# Patient Record
Sex: Female | Born: 1990
Health system: Southern US, Community
[De-identification: ages and names within clinical notes are randomized; demographics above are authoritative.]

## PROBLEM LIST (undated history)

## (undated) DIAGNOSIS — N920 Excessive and frequent menstruation with regular cycle: Secondary | ICD-10-CM

## (undated) DIAGNOSIS — B9689 Other specified bacterial agents as the cause of diseases classified elsewhere: Secondary | ICD-10-CM

## (undated) DIAGNOSIS — D259 Leiomyoma of uterus, unspecified: Secondary | ICD-10-CM

## (undated) DIAGNOSIS — B379 Candidiasis, unspecified: Secondary | ICD-10-CM

## (undated) DIAGNOSIS — K219 Gastro-esophageal reflux disease without esophagitis: Secondary | ICD-10-CM

## (undated) DIAGNOSIS — N760 Acute vaginitis: Secondary | ICD-10-CM

## (undated) DIAGNOSIS — J302 Other seasonal allergic rhinitis: Secondary | ICD-10-CM

## (undated) DIAGNOSIS — O3410 Maternal care for benign tumor of corpus uteri, unspecified trimester: Secondary | ICD-10-CM

## (undated) DIAGNOSIS — Z3490 Encounter for supervision of normal pregnancy, unspecified, unspecified trimester: Secondary | ICD-10-CM

## (undated) DIAGNOSIS — N390 Urinary tract infection, site not specified: Secondary | ICD-10-CM

## (undated) HISTORY — PX: NO PAST SURGERIES: SHX2092

## (undated) HISTORY — PX: TUBAL LIGATION: SHX77

---
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
## 2011-10-06 ENCOUNTER — Emergency Department (HOSPITAL_COMMUNITY)
Admission: EM | Admit: 2011-10-06 | Discharge: 2011-10-06 | Disposition: A | Discharge status: Home / Self Care | Payer: Self-pay

## 2011-10-06 NOTE — ED Notes (Signed)
Called for triage x1.

## 2011-10-06 NOTE — ED Notes (Signed)
Called for triage x3 no answer  

## 2011-10-06 NOTE — ED Notes (Signed)
Called for triage x2

## 2011-11-16 ENCOUNTER — Emergency Department (HOSPITAL_COMMUNITY): Payer: BC Managed Care – PPO

## 2011-11-16 ENCOUNTER — Emergency Department (HOSPITAL_COMMUNITY)
Admission: EM | Admit: 2011-11-16 | Discharge: 2011-11-16 | Disposition: A | Discharge status: Home / Self Care | Payer: BC Managed Care – PPO | Attending: Emergency Medicine | Admitting: Emergency Medicine

## 2011-11-16 ENCOUNTER — Encounter (HOSPITAL_COMMUNITY): Payer: Self-pay | Admitting: *Deleted

## 2011-11-16 DIAGNOSIS — R071 Chest pain on breathing: Secondary | ICD-10-CM | POA: Insufficient documentation

## 2011-11-16 DIAGNOSIS — R0789 Other chest pain: Secondary | ICD-10-CM

## 2011-11-16 LAB — BASIC METABOLIC PANEL
GFR calc Af Amer: >90 mL/min (ref >90)
GFR calc non Af Amer: >90 mL/min (ref >90)
Glucose, Bld: 122 mg/dL — ABNORMAL HIGH (ref 70–99)
Potassium: 3.9 mEq/L (ref 3.5–5.1)
Sodium: 137 mEq/L (ref 135–145)

## 2011-11-16 LAB — CBC
MCH: 30.9 pg (ref 26.0–34.0)
Platelets: 308 10*3/uL (ref 150–400)
RBC: 4.01 MIL/uL (ref 3.87–5.11)

## 2011-11-16 LAB — DIFFERENTIAL
Basophils Absolute: 0 10*3/uL (ref 0.0–0.1)
Basophils Relative: 0 % (ref 0–1)
Eosinophils Absolute: 0.2 10*3/uL (ref 0.0–0.7)
Lymphs Abs: 3.7 10*3/uL (ref 0.7–4.0)
Neutrophils Relative %: 43 % (ref 43–77)

## 2011-11-16 LAB — D-DIMER, QUANTITATIVE: D-Dimer, Quant: 0.24 ug/mL-FEU (ref 0.00–0.48)

## 2011-11-16 MED ORDER — GI COCKTAIL ~~LOC~~
30.0000 mL | Freq: Once | ORAL | Status: AC
  Administered 2011-11-16: 30 mL via ORAL
  Filled 2011-11-16: qty 30

## 2011-11-16 NOTE — ED Notes (Signed)
Lt upper chest pain,No cough, or cold.  Sl sob,  Pain awakened pt..  No injury

## 2011-11-16 NOTE — Discharge Instructions (Signed)
Chest Pain (Nonspecific) There is no evidence of a heart attack or blood clot in the lung.  Follow up with your doctor.  Return to the ED if you develop new or worsening symptoms. It is often hard to give a specific diagnosis for the cause of chest pain. There is always a chance that your pain could be related to something serious, such as a heart attack or a blood clot in the lungs. You need to follow up with your caregiver for further evaluation. CAUSES   Heartburn.   Pneumonia or bronchitis.   Anxiety or stress.   Inflammation around your heart (pericarditis) or lung (pleuritis or pleurisy).   A blood clot in the lung.   A collapsed lung (pneumothorax). It can develop suddenly on its own (spontaneous pneumothorax) or from injury (trauma) to the chest.   Shingles infection (herpes zoster virus).  The chest wall is composed of bones, muscles, and cartilage. Any of these can be the source of the pain.  The bones can be bruised by injury.   The muscles or cartilage can be strained by coughing or overwork.   The cartilage can be affected by inflammation and become sore (costochondritis).  DIAGNOSIS  Lab tests or other studies, such as X-rays, electrocardiography, stress testing, or cardiac imaging, may be needed to find the cause of your pain.  TREATMENT   Treatment depends on what may be causing your chest pain. Treatment may include:   Acid blockers for heartburn.   Anti-inflammatory medicine.   Pain medicine for inflammatory conditions.   Antibiotics if an infection is present.   You may be advised to change lifestyle habits. This includes stopping smoking and avoiding alcohol, caffeine, and chocolate.   You may be advised to keep your head raised (elevated) when sleeping. This reduces the chance of acid going backward from your stomach into your esophagus.   Most of the time, nonspecific chest pain will improve within 2 to 3 days with rest and mild pain medicine.  HOME  CARE INSTRUCTIONS   If antibiotics were prescribed, take your antibiotics as directed. Finish them even if you start to feel better.   For the next few days, avoid physical activities that bring on chest pain. Continue physical activities as directed.   Do not smoke.   Avoid drinking alcohol.   Only take over-the-counter or prescription medicine for pain, discomfort, or fever as directed by your caregiver.   Follow your caregiver's suggestions for further testing if your chest pain does not go away.   Keep any follow-up appointments you made. If you do not go to an appointment, you could develop lasting (chronic) problems with pain. If there is any problem keeping an appointment, you must call to reschedule.  SEEK MEDICAL CARE IF:   You think you are having problems from the medicine you are taking. Read your medicine instructions carefully.   Your chest pain does not go away, even after treatment.   You develop a rash with blisters on your chest.  SEEK IMMEDIATE MEDICAL CARE IF:   You have increased chest pain or pain that spreads to your arm, neck, jaw, back, or abdomen.   You develop shortness of breath, an increasing cough, or you are coughing up blood.   You have severe back or abdominal pain, feel nauseous, or vomit.   You develop severe weakness, fainting, or chills.   You have a fever.  THIS IS AN EMERGENCY. Do not wait to see if the pain  will go away. Get medical help at once. Call your local emergency services (911 in [place] Do not drive yourself to the hospital. MAKE SURE YOU:   Understand these instructions.   Will watch your condition.   Will get help right away if you are not doing well or get worse.  Document Released: 02/21/2005 Document Revised: 05/03/2011 Document Reviewed: 12/18/2007 [facility] Patient Information 2012 [place], [place].

## 2011-11-16 NOTE — ED Provider Notes (Signed)
History   This chart was scribed for Glynn Octave, MD by Melba Coon. The patient was seen in room APA18/APA18 and the patient's care was started at 9:08PM.    CSN: [id number]  Arrival date & time 11/16/11  2047   First MD Initiated Contact with Patient 11/16/11 2055      Chief Complaint  Patient presents with  . Chest Pain    (Consider location/radiation/quality/duration/timing/severity/associated sxs/prior treatment) HPI Jacqueline Underwood is a 21 y.o. female who presents to the Emergency Department complaining of constant, moderate to severe, left, upper, central chest pain with an onset this evening (an hour ago). Pt was asleep and the pain woke her up. Pt had just eaten [place] food prior to going asleep. Pt has never experienced this type of pain before. Pt is not on birth control; PERC negative, no recent surgeries. No pain meds taken at home. SOB present. No HA, cough, fever, neck pain, sore throat, rash, back pain, abd pain, n/v/d, dysuria, or extremity pain, edema, weakness, numbness, or tingling. No known allergies. No other pertinent medical symptoms.  History reviewed. No pertinent past medical history.  History reviewed. No pertinent past surgical history.  History reviewed. No pertinent family history.  History  Substance Use Topics  . Smoking status: Never Smoker   . Smokeless tobacco: Not on file  . Alcohol Use: No    OB History    Grav Para Term Preterm Abortions TAB SAB Ect Mult Living                  Review of Systems 10 Systems reviewed and all are negative for acute change except as noted in the HPI.   Allergies  Review of patient's allergies indicates not on file.  Home Medications  No current outpatient prescriptions on file.  BP 127/79  Pulse 69  Temp 98.2 F (36.8 C) (Oral)  Resp 20  Ht 5' (1.524 m)  Wt 200 lb (90.719 kg)  BMI 39.06 kg/m2  SpO2 100%  LMP 11/11/2011  Physical Exam  Nursing note and vitals  reviewed. Constitutional: She is oriented to person, place, and time. She appears well-developed and well-nourished. No distress.  HENT:  Head: Normocephalic and atraumatic.  Eyes: EOM are normal.  Neck: Neck supple. No tracheal deviation present.  Cardiovascular: Normal rate, regular rhythm and normal heart sounds.  Exam reveals no gallop and no friction rub.   No murmur heard. Pulmonary/Chest: Effort normal and breath sounds normal. No respiratory distress. She has no wheezes. She has no rales. She exhibits tenderness (slight reproducible left wall tendermess).  Abdominal: Soft. Bowel sounds are normal. She exhibits no distension and no mass. There is no tenderness. There is no rebound and no guarding.  Musculoskeletal: Normal range of motion. She exhibits no edema and no tenderness.  Neurological: She is alert and oriented to person, place, and time.  Skin: Skin is warm and dry.  Psychiatric: She has a normal mood and affect. Her behavior is normal.    ED Course  Procedures (including critical care time)  DIAGNOSTIC STUDIES: Oxygen Saturation is 100% on room air, normal by my interpretation.    COORDINATION OF CARE:  9:13PM - EDMD will order a CXR, GI cocktail, and, blood w/u for the pt. 9:39PM - recheck; EKG results nml; EDMD lets pt and family know that she probably did not have a heart attack.  Labs Reviewed  DIFFERENTIAL - Abnormal; Notable for the following:    Lymphocytes Relative 49 (*)  All other components within normal limits  BASIC METABOLIC PANEL - Abnormal; Notable for the following:    Glucose, Bld 122 (*)     All other components within normal limits  CBC  D-DIMER, QUANTITATIVE   Dg Chest 2 View  11/16/2011  *RADIOLOGY REPORT*  Clinical Data: Chest pain.  CHEST - 2 VIEW  Comparison: None.  Findings: Heart and mediastinal contours are within normal limits. No focal opacities or effusions.  No acute bony abnormality.  IMPRESSION: No active cardiopulmonary  disease.  Original Report Authenticated By: Cyndie Chime, M.D.     1. Chest wall pain       MDM  One hour of left upper chest pain after eating [place] food. No cough, shortness of breath, or fever.  No OCP use.  CXR negative. PERC negative. Pain resolved after GI cocktail.   Date: 11/16/2011  Rate: 65  Rhythm: normal sinus rhythm  QRS Axis: normal  Intervals: normal  ST/T Wave abnormalities: nonspecific T wave changes  Conduction Disutrbances:none  Narrative Interpretation: anterior T wave inversions  Old EKG Reviewed: none available         Glynn Octave, MD 11/18/11 [id number]

## 2012-04-01 ENCOUNTER — Emergency Department (HOSPITAL_COMMUNITY)
Admission: EM | Admit: 2012-04-01 | Discharge: 2012-04-01 | Disposition: A | Discharge status: Home / Self Care | Payer: BC Managed Care – PPO | Attending: Emergency Medicine | Admitting: Emergency Medicine

## 2012-04-01 ENCOUNTER — Encounter (HOSPITAL_COMMUNITY): Payer: Self-pay | Admitting: *Deleted

## 2012-04-01 DIAGNOSIS — R300 Dysuria: Secondary | ICD-10-CM | POA: Insufficient documentation

## 2012-04-01 DIAGNOSIS — N390 Urinary tract infection, site not specified: Secondary | ICD-10-CM | POA: Insufficient documentation

## 2012-04-01 LAB — PREGNANCY, URINE: Preg Test, Ur: NEGATIVE

## 2012-04-01 LAB — URINALYSIS, ROUTINE W REFLEX MICROSCOPIC
Ketones, ur: NEGATIVE mg/dL
Nitrite: NEGATIVE
Protein, ur: 30 mg/dL — AB
Urobilinogen, UA: 0.2 mg/dL (ref 0.0–1.0)

## 2012-04-01 MED ORDER — NITROFURANTOIN MACROCRYSTAL 100 MG PO CAPS: 100.0000 mg | ORAL_CAPSULE | Freq: Two times a day (BID) | ORAL | Status: DC

## 2012-04-01 MED ORDER — NITROFURANTOIN MACROCRYSTAL 100 MG PO CAPS
100.0000 mg | ORAL_CAPSULE | Freq: Once | ORAL | Status: AC
  Administered 2012-04-01: 100 mg via ORAL
  Filled 2012-04-01: qty 1

## 2012-04-01 NOTE — ED Notes (Signed)
Low abdominal pain with dysuria. 

## 2012-04-01 NOTE — ED Provider Notes (Signed)
History   This chart was scribed for [employer]. Colon Branch, MD by Charolett Bumpers . The patient was seen in room APA03/APA03. Patient's care was started at 2305.   CSN: [id number]  Arrival date & time 04/01/12  2139   First MD Initiated Contact with Patient 04/01/12 2305      Chief Complaint  Patient presents with  . Abdominal Pain    The history is provided by the patient. No language interpreter was used.   Jacqueline Underwood is a 21 y.o. female with no prior medical hx, presents to the Emergency Department complaining of constant, moderate lower abdominal pain that started around 7 pm tonight. She reports associated discomfort with urination but denies any burning sensation. She denies any hematuria, nausea, vomiting or back pain. She states her last normal BM was earlier today. She states her LNMP was 02/26/2012.   History reviewed. No pertinent past medical history.  History reviewed. No pertinent past surgical history.  No family history on file.  History  Substance Use Topics  . Smoking status: Never Smoker   . Smokeless tobacco: Not on file  . Alcohol Use: No    OB History    Grav Para Term Preterm Abortions TAB SAB Ect Mult Living                  Review of Systems  Constitutional: Negative for fever.       10 Systems reviewed and are negative for acute change except as noted in the HPI.  HENT: Negative for congestion.   Eyes: Negative for discharge and redness.  Respiratory: Negative for cough and shortness of breath.   Cardiovascular: Negative for chest pain.  Gastrointestinal: Positive for abdominal pain. Negative for vomiting.  Musculoskeletal: Negative for back pain.  Skin: Negative for rash.  Neurological: Negative for syncope, numbness and headaches.  Psychiatric/Behavioral:       No behavior change.   A complete 10 system review of systems was obtained and all systems are negative except as noted in the HPI and PMH.   Allergies  Review of patient's  allergies indicates no known allergies.  Home Medications  No current outpatient prescriptions on file.  BP 115/63  Pulse 88  Temp 98.1 F (36.7 C) (Oral)  Resp 18  Ht 5' (1.524 m)  Wt 204 lb (92.534 kg)  BMI 39.84 kg/m2  SpO2 100%  LMP 02/26/2012  Physical Exam  Nursing note and vitals reviewed. Constitutional: She is oriented to person, place, and time. She appears well-developed and well-nourished. No distress.  HENT:  Head: Normocephalic and atraumatic.  Right Ear: External ear normal.  Left Ear: External ear normal.  Eyes: Conjunctivae normal and EOM are normal. Pupils are equal, round, and reactive to light.  Neck: Normal range of motion. Neck supple. No tracheal deviation present.  Cardiovascular: Normal rate, regular rhythm and normal heart sounds.   No murmur heard. Pulmonary/Chest: Effort normal and breath sounds normal. No respiratory distress. She has no wheezes. She has no rales.  Abdominal: Soft. Bowel sounds are normal. She exhibits no distension. There is tenderness. There is no rebound, no guarding and no CVA tenderness.       Suprapubic tenderness to palpation.   Musculoskeletal: Normal range of motion. She exhibits no edema and no tenderness.  Neurological: She is alert and oriented to person, place, and time. No sensory deficit.  Skin: Skin is warm and dry.  Psychiatric: She has a normal mood and affect. Her behavior  is normal.    ED Course  Procedures (including critical care time)  DIAGNOSTIC STUDIES: Oxygen Saturation is 100% on room air, normal by my interpretation.    COORDINATION OF CARE:  23:10-Informed pt of lab results. Discussed planned course of treatment with the patient including treating a UTI with antibiotics, who is agreeable at this time.   23:15-Medication Orders: Nitrofurantoin (Macrodantin) capsule 100 mg-once  Results for orders placed during the hospital encounter of 04/01/12  PREGNANCY, URINE      Component Value Range    Preg Test, Ur NEGATIVE  NEGATIVE  URINALYSIS, ROUTINE W REFLEX MICROSCOPIC      Component Value Range   Color, Urine YELLOW  YELLOW   APPearance HAZY (*) CLEAR   Specific Gravity, Urine >1.030 (*) 1.005 - 1.030   pH 6.0  5.0 - 8.0   Glucose, UA NEGATIVE  NEGATIVE mg/dL   Hgb urine dipstick LARGE (*) NEGATIVE   Bilirubin Urine NEGATIVE  NEGATIVE   Ketones, ur NEGATIVE  NEGATIVE mg/dL   Protein, ur 30 (*) NEGATIVE mg/dL   Urobilinogen, UA 0.2  0.0 - 1.0 mg/dL   Nitrite NEGATIVE  NEGATIVE   Leukocytes, UA SMALL (*) NEGATIVE  URINE MICROSCOPIC-ADD ON      Component Value Range   Squamous Epithelial / LPF FEW (*) RARE   WBC, UA TOO NUMEROUS TO COUNT  <3 WBC/hpf   RBC / HPF 21-50  <3 RBC/hpf   Bacteria, UA MANY (*) RARE       MDM  Patient with lower abdominal pain that began this evening accompanied by mild discomfort with urination. UA positive for infection. Initiated antibiotic therapy. Pt stable in ED with no significant deterioration in condition.The patient appears reasonably screened and/or stabilized for discharge and I doubt any other medical condition or other [facility] requiring further screening, evaluation, or treatment in the ED at this time prior to discharge.  I personally performed the services described in this documentation, which was scribed in my presence. The recorded information has been reviewed and considered.   MDM Reviewed: nursing note and vitals Interpretation: labs           Nicoletta Dress. Colon Branch, MD 04/02/12 [id number]

## 2012-04-01 NOTE — ED Notes (Signed)
States that she is having pain in her lower abdomen with urination, states that the pain is only present when she voids.  States she is having foul smelling vaginal discharge that started about 1 week ago.  States that the vaginal discharge is yellow in appearance.  States she has a history of ovarian cysts as well.  States that she is sexually active.  LMP 02/26/2012.

## 2012-04-03 LAB — URINE CULTURE: Colony Count: >=100000

## 2012-04-04 NOTE — ED Notes (Signed)
+  Urine. Patient treated with Macrodantin. Sensitive to same. Per protocol MD. °

## 2012-05-18 ENCOUNTER — Emergency Department (HOSPITAL_COMMUNITY)
Admission: EM | Admit: 2012-05-18 | Discharge: 2012-05-18 | Disposition: A | Discharge status: Home / Self Care | Payer: BC Managed Care – PPO | Attending: Emergency Medicine | Admitting: Emergency Medicine

## 2012-05-18 ENCOUNTER — Encounter (HOSPITAL_COMMUNITY): Payer: Self-pay | Admitting: *Deleted

## 2012-05-18 DIAGNOSIS — R109 Unspecified abdominal pain: Secondary | ICD-10-CM | POA: Insufficient documentation

## 2012-05-18 DIAGNOSIS — R112 Nausea with vomiting, unspecified: Secondary | ICD-10-CM | POA: Insufficient documentation

## 2012-05-18 LAB — URINALYSIS, ROUTINE W REFLEX MICROSCOPIC
Bilirubin Urine: NEGATIVE
Hgb urine dipstick: NEGATIVE
Ketones, ur: NEGATIVE mg/dL
Nitrite: NEGATIVE
Urobilinogen, UA: 0.2 mg/dL (ref 0.0–1.0)

## 2012-05-18 NOTE — ED Notes (Signed)
Gave patient blanket as requested.  

## 2012-05-18 NOTE — ED Notes (Signed)
Pt reports cramping in lower abdomen beginning about 2 days ago.  Denies any bleeding.  Recently discovered she was pregnant.

## 2012-05-24 NOTE — ED Provider Notes (Signed)
History     CSN: [id number]  Arrival date & time 05/18/12  0117   First MD Initiated Contact with Patient 05/18/12 0327      Chief Complaint  Patient presents with  . Abdominal Pain    (Consider location/radiation/quality/duration/timing/severity/associated sxs/prior treatment) HPI Jacqueline Underwood is a 21 y.o. female who presents to the Emergency Department complaining of lower abdominal cramping that has been present for 2 days. She has had some associated nausea and vomiting. She recently found out she was pregnant. She has taken no medicines. She denies fever, chills, diarrhea, vaginal discharge, vaginal bleeding. History reviewed. No pertinent past medical history.  History reviewed. No pertinent past surgical history.  History reviewed. No pertinent family history.  History  Substance Use Topics  . Smoking status: Never Smoker   . Smokeless tobacco: Not on file  . Alcohol Use: No    OB History    Grav Para Term Preterm Abortions TAB SAB Ect Mult Living   1               Review of Systems  Constitutional: Negative for fever.       10 Systems reviewed and are negative for acute change except as noted in the HPI.  HENT: Negative for congestion.   Eyes: Negative for discharge and redness.  Respiratory: Negative for cough and shortness of breath.   Cardiovascular: Negative for chest pain.  Gastrointestinal: Positive for nausea, vomiting and abdominal pain.  Musculoskeletal: Negative for back pain.  Skin: Negative for rash.  Neurological: Negative for syncope, numbness and headaches.  Psychiatric/Behavioral:       No behavior change.    Allergies  Review of patient's allergies indicates no known allergies.  Home Medications   Current Outpatient Rx  Name  Route  Sig  Dispense  Refill  . PRENATAL MULTIVITAMIN CH   Oral   Take 1 tablet by mouth daily.         Marland Kitchen NITROFURANTOIN MACROCRYSTAL 100 MG PO CAPS   Oral   Take 1 capsule (100 mg total) by mouth 2  (two) times daily.   10 capsule   0     BP 95/76  Pulse 76  Temp 98.3 F (36.8 C) (Oral)  Resp 20  Ht 5' (1.524 m)  Wt 214 lb (97.07 kg)  BMI 41.79 kg/m2  SpO2 99%  LMP 02/26/2012  Physical Exam  Nursing note and vitals reviewed. Constitutional:       Awake, alert, nontoxic appearance.  HENT:  Head: Atraumatic.  Eyes: Right eye exhibits no discharge. Left eye exhibits no discharge.  Neck: Neck supple.  Cardiovascular: Normal heart sounds.   Pulmonary/Chest: Effort normal and breath sounds normal. She exhibits no tenderness.  Abdominal: Soft. There is no tenderness. There is no rebound.       No tenderness to palpation of entire abdomen  Musculoskeletal: She exhibits no tenderness.       Baseline ROM, no obvious new focal weakness.  Neurological:       Mental status and motor strength appears baseline for patient and situation.  Skin: No rash noted.  Psychiatric: She has a normal mood and affect.    ED Course  Procedures (including critical care time) Results for orders placed during the hospital encounter of 05/18/12  URINALYSIS, ROUTINE W REFLEX MICROSCOPIC      Component Value Range   Color, Urine YELLOW  YELLOW   APPearance CLEAR  CLEAR   Specific Gravity, Urine >1.030 (*) 1.005 -  1.030   pH 5.0  5.0 - 8.0   Glucose, UA NEGATIVE  NEGATIVE mg/dL   Hgb urine dipstick NEGATIVE  NEGATIVE   Bilirubin Urine NEGATIVE  NEGATIVE   Ketones, ur NEGATIVE  NEGATIVE mg/dL   Protein, ur NEGATIVE  NEGATIVE mg/dL   Urobilinogen, UA 0.2  0.0 - 1.0 mg/dL   Nitrite NEGATIVE  NEGATIVE   Leukocytes, UA NEGATIVE  NEGATIVE    No results found.   1. Abdominal cramping       MDM  Patient presents with abdominal cramping for the last two days associated with nausea and vomiting. Recent diagnosis of pregnancy. UA normal. Encouraged patient to follow up with OB/GYN . Pt stable in ED with no significant deterioration in condition.The patient appears reasonably screened and/or  stabilized for discharge and I doubt any other medical condition or other [facility] requiring further screening, evaluation, or treatment in the ED at this time prior to discharge.  MDM Reviewed: nursing note and vitals Interpretation: labs          Nicoletta Dress. Colon Branch, MD 05/24/12 1610

## 2012-05-28 NOTE — L&D Delivery Note (Signed)
Delivery Note At 6:34 PM a viable female was delivered via Vaginal, Spontaneous Delivery (Presentation: Right Occiput Anterior).  APGAR: 8, 9; weight 7 lb 3.5 oz (3274 g).   Placenta status: Intact, Spontaneous.  Cord: 3 vessels with the following complications: None.    Anesthesia: Epidural  Episiotomy: None Lacerations: vaginal side wall- not hemostatic.  Suture Repair: 3.0 vicryl Est. Blood Loss (mL): 400  Mom to postpartum.  Baby to nursery-stable.  Dorathy Kinsman 01/15/2013, 8:14 PM

## 2012-05-30 LAB — OB RESULTS CONSOLE ABO/RH: RH Type: POSITIVE

## 2012-05-30 LAB — OB RESULTS CONSOLE VARICELLA ZOSTER ANTIBODY, IGG: Varicella: IMMUNE

## 2012-05-30 LAB — OB RESULTS CONSOLE ANTIBODY SCREEN: Antibody Screen: NEGATIVE

## 2012-05-30 LAB — OB RESULTS CONSOLE HEPATITIS B SURFACE ANTIGEN: Hepatitis B Surface Ag: NEGATIVE

## 2012-06-09 ENCOUNTER — Encounter (HOSPITAL_COMMUNITY): Payer: Self-pay

## 2012-06-09 ENCOUNTER — Emergency Department (HOSPITAL_COMMUNITY)
Admission: EM | Admit: 2012-06-09 | Discharge: 2012-06-09 | Disposition: A | Discharge status: Home / Self Care | Payer: BC Managed Care – PPO | Attending: Emergency Medicine | Admitting: Emergency Medicine

## 2012-06-09 DIAGNOSIS — R109 Unspecified abdominal pain: Secondary | ICD-10-CM | POA: Insufficient documentation

## 2012-06-09 DIAGNOSIS — R110 Nausea: Secondary | ICD-10-CM | POA: Insufficient documentation

## 2012-06-09 DIAGNOSIS — O26859 Spotting complicating pregnancy, unspecified trimester: Secondary | ICD-10-CM | POA: Insufficient documentation

## 2012-06-09 DIAGNOSIS — O9989 Other specified diseases and conditions complicating pregnancy, childbirth and the puerperium: Secondary | ICD-10-CM | POA: Insufficient documentation

## 2012-06-09 DIAGNOSIS — M545 Low back pain, unspecified: Secondary | ICD-10-CM | POA: Insufficient documentation

## 2012-06-09 DIAGNOSIS — N898 Other specified noninflammatory disorders of vagina: Secondary | ICD-10-CM | POA: Insufficient documentation

## 2012-06-09 DIAGNOSIS — R300 Dysuria: Secondary | ICD-10-CM | POA: Insufficient documentation

## 2012-06-09 HISTORY — DX: Encounter for supervision of normal pregnancy, unspecified, unspecified trimester: Z34.90

## 2012-06-09 LAB — URINALYSIS, ROUTINE W REFLEX MICROSCOPIC
Glucose, UA: NEGATIVE mg/dL
Ketones, ur: NEGATIVE mg/dL
Leukocytes, UA: NEGATIVE
Nitrite: NEGATIVE
Protein, ur: NEGATIVE mg/dL
Urobilinogen, UA: 0.2 mg/dL (ref 0.0–1.0)

## 2012-06-09 LAB — WET PREP, GENITAL
Trich, Wet Prep: NONE SEEN
Yeast Wet Prep HPF POC: NONE SEEN

## 2012-06-09 MED ORDER — ACETAMINOPHEN 500 MG PO TABS
1000.0000 mg | ORAL_TABLET | Freq: Once | ORAL | Status: AC
  Administered 2012-06-09: 1000 mg via ORAL
  Filled 2012-06-09: qty 2

## 2012-06-09 MED ORDER — ONDANSETRON 8 MG PO TBDP
8.0000 mg | ORAL_TABLET | Freq: Once | ORAL | Status: AC
  Administered 2012-06-09: 8 mg via ORAL
  Filled 2012-06-09: qty 1

## 2012-06-09 NOTE — ED Provider Notes (Signed)
History     CSN: [id number]  Arrival date & time 06/09/12  0102   First MD Initiated Contact with Patient 06/09/12 0130      Chief Complaint  Patient presents with  . pregnant-spotting     (Consider location/radiation/quality/duration/timing/severity/associated sxs/prior treatment) HPI Comments: 22 year old female who is G2 P1 at 8 weeks by ultrasound who presents with a complaint of vaginal or urinary bleeding. She states that she was fine until this evening when she developed dysuria and spotting with brownish red material when she wipes. She had an ultrasound confirming an intrauterine pregnancy last week. She also admits to having mild low back pain, mild suprapubic pain and nausea. She has not had any significant vomiting and had a fall a meal of [place] food this evening. The symptoms are intermittent, mild to moderate, similar to prior urinary tract infection symptoms. She took Macrobid for urinary tract infection approximately one month ago and felt as if the symptoms resolve successfully.  The history is provided by the patient and a friend.    Past Medical History  Diagnosis Date  . Pregnant     History reviewed. No pertinent past surgical history.  No family history on file.  History  Substance Use Topics  . Smoking status: Never Smoker   . Smokeless tobacco: Not on file  . Alcohol Use: No    OB History    Grav Para Term Preterm Abortions TAB SAB Ect Mult Living   1               Review of Systems  Constitutional: Negative for fever and chills.  HENT: Negative for sore throat and neck pain.   Eyes: Negative for visual disturbance.  Respiratory: Negative for cough and shortness of breath.   Cardiovascular: Negative for chest pain.  Gastrointestinal: Positive for nausea and abdominal pain. Negative for vomiting and diarrhea.  Genitourinary: Positive for dysuria and vaginal bleeding. Negative for frequency.  Musculoskeletal: Positive for back pain.  Skin:  Negative for rash.  Neurological: Negative for weakness, numbness and headaches.  Hematological: Negative for adenopathy.  Psychiatric/Behavioral: Negative for behavioral problems.    Allergies  Review of patient's allergies indicates no known allergies.  Home Medications   Current Outpatient Rx  Name  Route  Sig  Dispense  Refill  . PRENATAL MULTIVITAMIN CH   Oral   Take 1 tablet by mouth daily.         Marland Kitchen NITROFURANTOIN MACROCRYSTAL 100 MG PO CAPS   Oral   Take 1 capsule (100 mg total) by mouth 2 (two) times daily.   10 capsule   0     BP 107/65  Pulse 90  Temp 98 F (36.7 C) (Oral)  Resp 20  Ht 5\' 1"  (1.549 m)  Wt 214 lb (97.07 kg)  BMI 40.44 kg/m2  SpO2 99%  LMP 02/26/2012  Physical Exam  Nursing note and vitals reviewed. Constitutional: She appears well-developed and well-nourished. No distress.  HENT:  Head: Normocephalic and atraumatic.  Mouth/Throat: Oropharynx is clear and moist. No oropharyngeal exudate.  Eyes: Conjunctivae normal and EOM are normal. Pupils are equal, round, and reactive to light. Right eye exhibits no discharge. Left eye exhibits no discharge. No scleral icterus.  Neck: Normal range of motion. Neck supple. No JVD present. No thyromegaly present.  Cardiovascular: Normal rate, regular rhythm, normal heart sounds and intact distal pulses.  Exam reveals no gallop and no friction rub.   No murmur heard. Pulmonary/Chest: Effort normal and breath  sounds normal. No respiratory distress. She has no wheezes. She has no rales.  Abdominal: Soft. Bowel sounds are normal. She exhibits no distension and no mass. There is tenderness ( Mild tenderness to palpation in the suprapubic region, no guarding, no peritoneal signs).  Genitourinary:       Chaperone present for vaginal exam.  Vaginal vault with mild discharge, no foul odor, no bleeding, no cervical motion tenderness, no cervical bleeding, cervical os is closed.  Musculoskeletal: Normal range of  motion. She exhibits no edema and no tenderness.  Lymphadenopathy:    She has no cervical adenopathy.  Neurological: She is alert. Coordination normal.  Skin: Skin is warm and dry. No rash noted. No erythema.  Psychiatric: She has a normal mood and affect. Her behavior is normal.    ED Course  Procedures (including critical care time)  Labs Reviewed  WET PREP, GENITAL - Abnormal; Notable for the following:    Clue Cells Wet Prep HPF POC FEW (*)     WBC, Wet Prep HPF POC FEW (*)     All other components within normal limits  URINALYSIS, ROUTINE W REFLEX MICROSCOPIC  GC/CHLAMYDIA PROBE AMP   No results found.   1. Vaginal discharge       MDM  The patient is well-appearing without any signs of fever or tachycardia. Her abdomen is soft but minimally tender in the suprapubic region. She has had a confirmed intrauterine pregnancy. Will do a pelvic exam and in and out catheterization to verify source of bleeding, rule out urinary infection. Tylenol and Zofran ordered      Urinalysis is negative for infection, vaginal sample showed no signs of Trichomonas or yeast infections, patient informed of GC Chlamydia results pending, well-appearing, stable for discharge.  Vida Roller, MD 06/09/12 [phone]

## 2012-06-09 NOTE — ED Notes (Signed)
Pt c/o burning with urination and "brown/pink" spotting.  Pt states she is [redacted] weeks pregnant.

## 2012-06-09 NOTE — ED Notes (Signed)
Pt states she is approx 8 weeks, pregnant, tonight with one episode of spotting, brownish red material.  Now with burning to vaginal area with urination

## 2012-06-10 LAB — GC/CHLAMYDIA PROBE AMP: CT Probe RNA: NEGATIVE

## 2012-07-24 ENCOUNTER — Encounter: Payer: Self-pay | Admitting: *Deleted

## 2012-08-11 ENCOUNTER — Other Ambulatory Visit: Payer: Self-pay | Admitting: Obstetrics and Gynecology

## 2012-08-14 LAB — MATERNAL SCREEN, INTEGRATED #2
AFP, Serum: 41 ng/mL
Age risk Down Syndrome: 1:1100 {titer}
Calculated Gestational Age: 17.1
Inhibin A Dimeric: 154 pg/mL
Inhibin A MoM: 1.07
MSS Down Syndrome: <1:5000 {titer}
Maternal weight: 218.6 [lb_av]
NT MoM: 0.93
Nuchal Translucency: 1.57 mm
Number of fetuses: 1
PAPP-A: 1933 ng/mL
Rish for ONTD: <1:5000 {titer}

## 2012-08-18 ENCOUNTER — Encounter (HOSPITAL_COMMUNITY): Payer: Self-pay | Admitting: *Deleted

## 2012-08-18 ENCOUNTER — Emergency Department (HOSPITAL_COMMUNITY)
Admission: EM | Admit: 2012-08-18 | Discharge: 2012-08-18 | Disposition: A | Discharge status: Home / Self Care | Payer: BC Managed Care – PPO | Attending: Emergency Medicine | Admitting: Emergency Medicine

## 2012-08-18 DIAGNOSIS — R109 Unspecified abdominal pain: Secondary | ICD-10-CM | POA: Insufficient documentation

## 2012-08-18 DIAGNOSIS — O9989 Other specified diseases and conditions complicating pregnancy, childbirth and the puerperium: Secondary | ICD-10-CM | POA: Insufficient documentation

## 2012-08-18 DIAGNOSIS — O2340 Unspecified infection of urinary tract in pregnancy, unspecified trimester: Secondary | ICD-10-CM

## 2012-08-18 DIAGNOSIS — O209 Hemorrhage in early pregnancy, unspecified: Secondary | ICD-10-CM | POA: Insufficient documentation

## 2012-08-18 DIAGNOSIS — N390 Urinary tract infection, site not specified: Secondary | ICD-10-CM | POA: Insufficient documentation

## 2012-08-18 DIAGNOSIS — O2390 Unspecified genitourinary tract infection in pregnancy, unspecified trimester: Secondary | ICD-10-CM | POA: Insufficient documentation

## 2012-08-18 DIAGNOSIS — Z79899 Other long term (current) drug therapy: Secondary | ICD-10-CM | POA: Insufficient documentation

## 2012-08-18 LAB — URINALYSIS, ROUTINE W REFLEX MICROSCOPIC
Bilirubin Urine: NEGATIVE
Glucose, UA: NEGATIVE mg/dL
Protein, ur: NEGATIVE mg/dL
Urobilinogen, UA: 0.2 mg/dL (ref 0.0–1.0)

## 2012-08-18 LAB — CBC
MCH: 31.1 pg (ref 26.0–34.0)
MCHC: 34.2 g/dL (ref 30.0–36.0)
MCV: 91 fL (ref 78.0–100.0)
Platelets: 261 10*3/uL (ref 150–400)

## 2012-08-18 LAB — WET PREP, GENITAL
Trich, Wet Prep: NONE SEEN
Yeast Wet Prep HPF POC: NONE SEEN

## 2012-08-18 LAB — ABO/RH: ABO/RH(D): O POS

## 2012-08-18 LAB — URINE MICROSCOPIC-ADD ON

## 2012-08-18 MED ORDER — NITROFURANTOIN MONOHYD MACRO 100 MG PO CAPS: 100.0000 mg | ORAL_CAPSULE | Freq: Two times a day (BID) | ORAL | Status: DC

## 2012-08-18 NOTE — ED Provider Notes (Signed)
History    This chart was scribed for Gilda Crease, MD by Gerlean Ren, ED Scribe. This patient was seen in room [facility]01/[facility] and the patient's care was started at 8:33 AM    CSN: [id number]  Arrival date & time 08/18/12  7253   None     Chief Complaint  Patient presents with  . Vaginal Bleeding     The history is provided by the patient. No language interpreter was used.  Jacqueline Underwood is a 22 y.o. female who presents to the Emergency Department complaining of small amounts of spotting vaginal bleeding that began yesterday with associated waxing-and-waning cramping lower abdominal pain.  Pt is 4 months pregnancy and has had normal ultrasound of fetus with next scheduled 04/01.  No vaginal pain or discharge.  OBGYN Dr. Emelda Fear  Past Medical History  Diagnosis Date  . Pregnant     History reviewed. No pertinent past surgical history.  Family History  Problem Relation Age of Onset  . Diabetes Other   . Hypertension Other     History  Substance Use Topics  . Smoking status: Never Smoker   . Smokeless tobacco: Not on file  . Alcohol Use: No    OB History   Grav Para Term Preterm Abortions TAB SAB Ect Mult Living   2    1  1          Review of Systems  Gastrointestinal: Positive for abdominal pain.  Genitourinary: Positive for vaginal bleeding. Negative for vaginal discharge and vaginal pain.  All other systems reviewed and are negative.    Allergies  Review of patient's allergies indicates no known allergies.  Home Medications   Current Outpatient Rx  Name  Route  Sig  Dispense  Refill  . nitrofurantoin (MACRODANTIN) 100 MG capsule   Oral   Take 1 capsule (100 mg total) by mouth 2 (two) times daily.   10 capsule   0   . Prenatal Vit-Fe Fumarate-FA (PRENATAL MULTIVITAMIN) TABS   Oral   Take 1 tablet by mouth daily.           BP 108/70  Pulse 97  Temp(Src) 98.1 F (36.7 C) (Oral)  Resp 16  Ht 5' (1.524 m)  Wt 214 lb (97.07 kg)  BMI  41.79 kg/m2  SpO2 97%  LMP 03/28/2012  Physical Exam  Nursing note and vitals reviewed. Constitutional: She is oriented to person, place, and time. She appears well-developed and well-nourished. No distress.  HENT:  Head: Normocephalic and atraumatic.  Right Ear: Hearing normal.  Nose: Nose normal.  Mouth/Throat: Oropharynx is clear and moist and mucous membranes are normal.  Eyes: Conjunctivae and EOM are normal. Pupils are equal, round, and reactive to light.  Neck: Normal range of motion. Neck supple.  Cardiovascular: Normal rate, regular rhythm, S1 normal and S2 normal.  Exam reveals no gallop and no friction rub.   No murmur heard. Pulmonary/Chest: Effort normal and breath sounds normal. No respiratory distress. She exhibits no tenderness.  Abdominal: Soft. Normal appearance and bowel sounds are normal. There is no hepatosplenomegaly. There is no tenderness. There is no rebound, no guarding, no tenderness at McBurney's point and negative Murphy's sign. No hernia. Hernia confirmed negative in the right inguinal area and confirmed negative in the left inguinal area.  Genitourinary: Vagina normal and uterus normal. There is no lesion on the right labia. There is no lesion on the left labia. Cervix exhibits discharge. Cervix exhibits no motion tenderness and no friability.  Right adnexum displays no mass, no tenderness and no fullness. Left adnexum displays no mass, no tenderness and no fullness.  Musculoskeletal: Normal range of motion.  Lymphadenopathy:       Right: No inguinal adenopathy present.       Left: No inguinal adenopathy present.  Neurological: She is alert and oriented to person, place, and time. She has normal strength. No cranial nerve deficit or sensory deficit. Coordination normal. GCS eye subscore is 4. GCS verbal subscore is 5. GCS motor subscore is 6.  Skin: Skin is warm, dry and intact. No rash noted. No cyanosis.  Psychiatric: She has a normal mood and affect. Her  speech is normal and behavior is normal. Thought content normal.    ED Course  Procedures (including critical care time) DIAGNOSTIC STUDIES: Oxygen Saturation is 97% on room air, adequate by my interpretation.    COORDINATION OF CARE: 8:35 AM- Patient informed of clinical course including pelvic exam, blood work, and urinalysis.  Pt understands medical decision-making process, and agrees with plan.  Labs Reviewed - No data to display No results found.   Diagnoses: 1. Vaginal bleeding in early pregnancy 2. UTI    MDM  Patient reports vaginal spotting yesterday with slight pelvic cramping. Patient is approximately 4 months pregnant. She has had prenatal care with Dr. Emelda Fear. She has had a previous ultrasound showed intrauterine pregnancy. Fetal heart tones easily identified at 150 once a day in the ER. I do not see any need for further ultrasound imaging today.  She tells me that she is blood type O-positive. Blood drawn for typing as well as quantitative hCG.  Examination is entirely unremarkable today. Pelvic exam revealed some white discharge on the cervix, otherwise no abnormalities. Cervix is closed, no bleeding. I suspect that the blood she saw was secondary to UTI. She has too numerous to count white cells, too numerous to count red cells and signs of infection in the urine. Patient will be treated with Macrobid. She is to followup with Dr. Emelda Fear, call today for earliest appointment. Return to the ER symptoms worsen.  I personally performed the services described in this documentation, which was scribed in my presence. The recorded information has been reviewed and is accurate.       Gilda Crease, MD 08/18/12 [phone]

## 2012-08-18 NOTE — ED Notes (Signed)
approx 4 months pregnant - started spotting yesterday and increasing since.  Last felt fetal movement last week.

## 2012-08-18 NOTE — ED Notes (Signed)
EDP and NT in to perform pelvic.

## 2012-08-19 LAB — URINE CULTURE: Colony Count: 15000

## 2012-08-19 LAB — GC/CHLAMYDIA PROBE AMP
CT Probe RNA: NEGATIVE
GC Probe RNA: NEGATIVE

## 2012-08-20 ENCOUNTER — Other Ambulatory Visit: Payer: Self-pay | Admitting: Obstetrics & Gynecology

## 2012-08-20 DIAGNOSIS — Z1389 Encounter for screening for other disorder: Secondary | ICD-10-CM

## 2012-08-20 DIAGNOSIS — O09299 Supervision of pregnancy with other poor reproductive or obstetric history, unspecified trimester: Secondary | ICD-10-CM

## 2012-08-26 ENCOUNTER — Encounter: Payer: Self-pay | Admitting: Obstetrics & Gynecology

## 2012-08-26 ENCOUNTER — Ambulatory Visit (INDEPENDENT_AMBULATORY_CARE_PROVIDER_SITE_OTHER): Payer: BC Managed Care – PPO

## 2012-08-26 ENCOUNTER — Ambulatory Visit (INDEPENDENT_AMBULATORY_CARE_PROVIDER_SITE_OTHER): Payer: BC Managed Care – PPO | Admitting: Obstetrics & Gynecology

## 2012-08-26 VITALS — BP 120/70 | Wt 217.0 lb

## 2012-08-26 DIAGNOSIS — Z1389 Encounter for screening for other disorder: Secondary | ICD-10-CM

## 2012-08-26 DIAGNOSIS — Z3482 Encounter for supervision of other normal pregnancy, second trimester: Secondary | ICD-10-CM

## 2012-08-26 DIAGNOSIS — O4410 Placenta previa with hemorrhage, unspecified trimester: Secondary | ICD-10-CM

## 2012-08-26 DIAGNOSIS — Z3480 Encounter for supervision of other normal pregnancy, unspecified trimester: Secondary | ICD-10-CM | POA: Insufficient documentation

## 2012-08-26 DIAGNOSIS — O09299 Supervision of pregnancy with other poor reproductive or obstetric history, unspecified trimester: Secondary | ICD-10-CM

## 2012-08-26 DIAGNOSIS — O4420 Partial placenta previa NOS or without hemorrhage, unspecified trimester: Secondary | ICD-10-CM | POA: Insufficient documentation

## 2012-08-26 LAB — US OB DETAIL + 14 WK

## 2012-08-26 LAB — POCT URINALYSIS DIPSTICK
Blood, UA: NEGATIVE
Glucose, UA: NEGATIVE
Nitrite, UA: NEGATIVE
Protein, UA: NEGATIVE

## 2012-08-26 NOTE — Progress Notes (Signed)
Here for routine prenatal visit and 20 wk [place]. No complaints.

## 2012-08-26 NOTE — Patient Instructions (Signed)
Placenta Previa Placenta previa is a condition in which the placenta has grown low in the womb (uterus). This is a condition in which the organ which connects the fetus to the mother's uterus (placenta) is low in the opening in the uterus (cervix). It can partially or completely cover the cervix. The cause of this is unknown. It is more common with multiple births or twins. SYMPTOMS  The main symptom or sign of placenta previa is vaginal bleeding. The bleeding can be mild to very heavy. This condition can be very serious for the mother and baby. Often there are no symptoms with placenta previa. Sometimes if the location of the placenta is very low it will become partially detached and cause bleeding. This may be simply a marginal sinus separation of the placenta. This is a separation of the vessels from the wall of the uterus. This may cause no further problems other than mild anxiety. There is an increase risk of intrauterine growth restriction (IUGR) with placenta previa because of the abnormal placement of the placenta. DIAGNOSIS  The diagnosis is usually made by ultrasound exam of the uterus. There may be a careful vaginal exam to see the cervix. The patient will be prepared for a Cesarean section immediately if necessary. TREATMENT  Treatment for placenta previa is usually bed rest in the hospital or at home. You may be given medication to stop contractions. Contractions can increase bleeding. Your doctor may take fluid from the baby's sac (amniocentesis) to see if the baby's lungs are mature enough for a C-section. A blood transfusion may be necessary if you have a low blood count. No further treatment may be needed when placenta previa is present in small degrees. Early placenta previa may resolve on it's own. The placenta moves higher in the birth canal as pregnancy progresses. In this case the placenta no longer is an obstruction to birth. The position of the placenta may need to be reconfirmed  during pregnancy. This can be done with an ultrasound exam of the belly(abdomen). Call your caregiver immediately if blood loss is severe. Immediate fluid or blood replacement may be necessary. With complete placenta previa, the only way to safely deliver the baby is by Cesarean section. HOME CARE INSTRUCTIONS   Follow your caregiver's advice about bed rest.  Take any iron pills or other medications your doctor gives to you.  No bending or lifting.  Do not have sexual intercourse.  Do not put anything in your vagina (tampons or vaginal creams). If you are bleeding, use sanitary pads.  Keep your doctors appointments as scheduled. Not keeping the appointment could result in a chronic or permanent injury, pain, disability and injury or death to you or your unborn baby. If there is any problem keeping the appointment, you must call back to this facility for assistance. SEEK IMMEDIATE MEDICAL CARE IF:   You have increased bleeding.  You have fainting episodes or feel lightheaded.  You develop abdominal pain.  You can no longer feel normal fetal or baby movements.  You develop uterine contractions. Document Released: 05/14/2005 Document Revised: 08/06/2011 Document Reviewed: 12/26/2007 [employer] Patient Information 2013 [employer].  

## 2012-08-26 NOTE — Progress Notes (Signed)
U/S-single active fetus, meas c/w dates, no major abnl noted, cx long and closed, POST MARGINAL PLAC. PREVIA noted, gr 0 plac, bilateral adnexa wnl, female fetus Sheria Lang"), would like to reck plac. At 28 wks

## 2012-08-26 NOTE — Progress Notes (Signed)
Patient reports good fetal movement, denies any bleeding and no rupture of membranes symptoms or contraction Discussed the sonogram report at length.  Marginal previa explained and knows will need to repeat scan at 28 weeks. All questions, no problems voiced.

## 2012-09-01 ENCOUNTER — Encounter: Payer: Self-pay | Admitting: *Deleted

## 2012-09-01 DIAGNOSIS — Z3480 Encounter for supervision of other normal pregnancy, unspecified trimester: Secondary | ICD-10-CM

## 2012-09-23 ENCOUNTER — Encounter: Payer: Self-pay | Admitting: Advanced Practice Midwife

## 2012-09-23 ENCOUNTER — Ambulatory Visit (INDEPENDENT_AMBULATORY_CARE_PROVIDER_SITE_OTHER): Payer: BC Managed Care – PPO | Admitting: Advanced Practice Midwife

## 2012-09-23 DIAGNOSIS — O0992 Supervision of high risk pregnancy, unspecified, second trimester: Secondary | ICD-10-CM

## 2012-09-23 DIAGNOSIS — Z3480 Encounter for supervision of other normal pregnancy, unspecified trimester: Secondary | ICD-10-CM | POA: Insufficient documentation

## 2012-09-23 DIAGNOSIS — O09299 Supervision of pregnancy with other poor reproductive or obstetric history, unspecified trimester: Secondary | ICD-10-CM

## 2012-09-23 LAB — POCT URINALYSIS DIPSTICK
Ketones, UA: NEGATIVE
Nitrite, UA: NEGATIVE
Protein, UA: NEGATIVE

## 2012-09-23 NOTE — Assessment & Plan Note (Signed)
Clinic:[facility]  Genetic Screen NT:                 normal                       Quad Screen/MSAFP:  Anatomic [place] normal  Glucose Screen   GBS   Feeding Preference Breast and bottle  Contraception   Circumcision Yes at [facility]

## 2012-09-23 NOTE — Progress Notes (Signed)
No c/o at this time. No bleeding. Routine questions about pregnancy answered.  F/U in 4 weeks for PN2 and U/S to recheck placenta previa.

## 2012-09-23 NOTE — Progress Notes (Signed)
C/o swelling, tingling and itching bilateral feet.

## 2012-09-23 NOTE — Patient Instructions (Signed)
Do not eat or drink after midnight before your next appointment.  You will be taking your glucose tolerance test and be getting an ultrasound to recheck the position of the placenta

## 2012-09-23 NOTE — Assessment & Plan Note (Signed)
Recheck 28 weeks CRESENZO-DISHMAN,Jacqueline Underwood 09/23/2012

## 2012-10-21 ENCOUNTER — Ambulatory Visit (INDEPENDENT_AMBULATORY_CARE_PROVIDER_SITE_OTHER): Payer: BC Managed Care – PPO

## 2012-10-21 ENCOUNTER — Other Ambulatory Visit: Payer: BC Managed Care – PPO

## 2012-10-21 ENCOUNTER — Encounter: Payer: Self-pay | Admitting: Women's Health

## 2012-10-21 ENCOUNTER — Ambulatory Visit (INDEPENDENT_AMBULATORY_CARE_PROVIDER_SITE_OTHER): Payer: BC Managed Care – PPO | Admitting: Women's Health

## 2012-10-21 VITALS — BP 98/70 | Wt 220.8 lb

## 2012-10-21 DIAGNOSIS — O3412 Maternal care for benign tumor of corpus uteri, second trimester: Secondary | ICD-10-CM

## 2012-10-21 DIAGNOSIS — D219 Benign neoplasm of connective and other soft tissue, unspecified: Secondary | ICD-10-CM | POA: Insufficient documentation

## 2012-10-21 DIAGNOSIS — O09299 Supervision of pregnancy with other poor reproductive or obstetric history, unspecified trimester: Secondary | ICD-10-CM

## 2012-10-21 DIAGNOSIS — Z331 Pregnant state, incidental: Secondary | ICD-10-CM

## 2012-10-21 DIAGNOSIS — O4410 Placenta previa with hemorrhage, unspecified trimester: Secondary | ICD-10-CM

## 2012-10-21 DIAGNOSIS — Z3482 Encounter for supervision of other normal pregnancy, second trimester: Secondary | ICD-10-CM

## 2012-10-21 DIAGNOSIS — Z1389 Encounter for screening for other disorder: Secondary | ICD-10-CM

## 2012-10-21 DIAGNOSIS — O0992 Supervision of high risk pregnancy, unspecified, second trimester: Secondary | ICD-10-CM

## 2012-10-21 HISTORY — DX: Benign neoplasm of connective and other soft tissue, unspecified: D21.9

## 2012-10-21 LAB — CBC
HCT: 33.1 % — ABNORMAL LOW (ref 36.0–46.0)
Hemoglobin: 11.4 g/dL — ABNORMAL LOW (ref 12.0–15.0)
MCV: 89.7 fL (ref 78.0–100.0)
RBC: 3.69 MIL/uL — ABNORMAL LOW (ref 3.87–5.11)
RDW: 13.2 % (ref 11.5–15.5)
WBC: 6.8 10*3/uL (ref 4.0–10.5)

## 2012-10-21 LAB — POCT URINALYSIS DIPSTICK
Glucose, UA: NEGATIVE
Ketones, UA: NEGATIVE
Protein, UA: NEGATIVE

## 2012-10-21 NOTE — Patient Instructions (Signed)

## 2012-10-21 NOTE — Progress Notes (Signed)
Reports good fm. Denies uc's, lof, vb, urinary frequency, urgency, hesitancy, or dysuria.  No complaints.  Reviewed today's u/s, ptl s/s, and fetal kick counts.  Discussed contraception options- thinking about nexplanon- pamphlet given. All questions answered. PN2 today. F/U in 4wks for visit.

## 2012-10-21 NOTE — Progress Notes (Signed)
U/S (27+2wks)-vtx active fetus, approp growth EFW 2lb 7 oz, (58th%tile), fluid wnl post gr 1 plac previa resolved (3+cm separates cx from plac tip), female fetus Sheria Lang")

## 2012-10-22 LAB — GLUCOSE TOLERANCE, 2 HOURS W/ 1HR: Glucose, Fasting: 78 mg/dL (ref 70–99)

## 2012-10-22 LAB — ANTIBODY SCREEN: Antibody Screen: NEGATIVE

## 2012-10-22 LAB — US OB FOLLOW UP

## 2012-10-22 LAB — HSV 2 ANTIBODY, IGG: HSV 2 Glycoprotein G Ab, IgG: 0.1 IV

## 2012-10-22 LAB — RPR

## 2012-10-25 ENCOUNTER — Encounter: Payer: Self-pay | Admitting: Women's Health

## 2012-11-09 ENCOUNTER — Emergency Department (HOSPITAL_COMMUNITY)
Admission: EM | Admit: 2012-11-09 | Discharge: 2012-11-09 | Disposition: A | Discharge status: Home / Self Care | Payer: Medicaid Other | Attending: Emergency Medicine | Admitting: Emergency Medicine

## 2012-11-09 DIAGNOSIS — M545 Low back pain, unspecified: Secondary | ICD-10-CM | POA: Insufficient documentation

## 2012-11-09 DIAGNOSIS — N760 Acute vaginitis: Secondary | ICD-10-CM | POA: Insufficient documentation

## 2012-11-09 DIAGNOSIS — B9689 Other specified bacterial agents as the cause of diseases classified elsewhere: Secondary | ICD-10-CM | POA: Insufficient documentation

## 2012-11-09 DIAGNOSIS — O9989 Other specified diseases and conditions complicating pregnancy, childbirth and the puerperium: Secondary | ICD-10-CM | POA: Insufficient documentation

## 2012-11-09 DIAGNOSIS — B373 Candidiasis of vulva and vagina: Secondary | ICD-10-CM | POA: Insufficient documentation

## 2012-11-09 DIAGNOSIS — B3731 Acute candidiasis of vulva and vagina: Secondary | ICD-10-CM | POA: Insufficient documentation

## 2012-11-09 DIAGNOSIS — O2390 Unspecified genitourinary tract infection in pregnancy, unspecified trimester: Secondary | ICD-10-CM | POA: Insufficient documentation

## 2012-11-09 DIAGNOSIS — A499 Bacterial infection, unspecified: Secondary | ICD-10-CM | POA: Insufficient documentation

## 2012-11-09 DIAGNOSIS — N390 Urinary tract infection, site not specified: Secondary | ICD-10-CM | POA: Insufficient documentation

## 2012-11-09 LAB — URINALYSIS, ROUTINE W REFLEX MICROSCOPIC
Hgb urine dipstick: NEGATIVE
Nitrite: NEGATIVE
Specific Gravity, Urine: 1.02 (ref 1.005–1.030)
Urobilinogen, UA: 1 mg/dL (ref 0.0–1.0)
pH: 7 (ref 5.0–8.0)

## 2012-11-09 LAB — WET PREP, GENITAL: Trich, Wet Prep: NONE SEEN

## 2012-11-09 LAB — URINE MICROSCOPIC-ADD ON

## 2012-11-09 MED ORDER — CLOTRIMAZOLE 1 % VA CREA: 1.0000 | TOPICAL_CREAM | Freq: Every day | VAGINAL | Status: DC

## 2012-11-09 MED ORDER — METRONIDAZOLE 500 MG PO TABS: 500.0000 mg | ORAL_TABLET | Freq: Two times a day (BID) | ORAL | Status: DC

## 2012-11-09 MED ORDER — CEPHALEXIN 500 MG PO CAPS: 500.0000 mg | ORAL_CAPSULE | Freq: Four times a day (QID) | ORAL | Status: DC

## 2012-11-09 NOTE — Progress Notes (Signed)
Received call from [facility] RN concerning pt who is [redacted] weeks pregnant c/o lower back pain since yesterday. Also c/o some constipation. APRN states that pt passed some hard stool this morning.Pt c/o a yellow colored vaginal d/c. Urine sent. Pt has a hx of a marginal previa which resolved 3cm from os at 27.2 weeks.

## 2012-11-09 NOTE — Progress Notes (Signed)
Taiked with Melissa [facility]. States pt is getting ready for d/c.

## 2012-11-09 NOTE — ED Notes (Signed)
Mary Rapid Response RN at [facility] called and advised that the baby "looks good" no contractions seen at present time,

## 2012-11-09 NOTE — ED Notes (Signed)
Pt presents to er with c/o lower back pain that started yesterday, yellow discharge today when she urinated, did have bowel movement this am that was "hard", last bowel movement before this am was 4 days ago, pt placed on fetal monitor, FHT 140's, [facility] at [facility],

## 2012-11-09 NOTE — ED Provider Notes (Signed)
History     CSN: [id number]  Arrival date & time 11/09/12  1255   First MD Initiated Contact with Patient 11/09/12 1400      Chief Complaint  Patient presents with  . Back Pain  . Constipation     HPI Pt was seen at 1400.  Per pt, c/o gradual onset and persistence of constant low back "pain" for the past few days. Pt describes the pain as "sore."  States she has also had vaginal discharge for the past several days and "feels constipated." States she had a "small hard BM" this morning. Pt hx G2P0Ab1, EDC 01/18/2013, with EGA [redacted] weeks. Denies LOF, no vaginal bleeding, no abd/pelvic pain, no N/V/D, no flank pain, no fevers, no rash.    Past Medical History  Diagnosis Date  . Pregnant     Past Surgical History  Procedure Laterality Date  . No past surgeries      Family History  Problem Relation Age of Onset  . Hypertension Father   . Diabetes Maternal Grandmother     History  Substance Use Topics  . Smoking status: Never Smoker   . Smokeless tobacco: Not on file  . Alcohol Use: No    OB History   Grav Para Term Preterm Abortions TAB SAB Ect Mult Living   2    1  1          Review of Systems ROS: Statement: All systems negative except as marked or noted in the HPI; Constitutional: Negative for fever and chills. ; ; Eyes: Negative for eye pain, redness and discharge. ; ; ENMT: Negative for ear pain, hoarseness, nasal congestion, sinus pressure and sore throat. ; ; Cardiovascular: Negative for chest pain, palpitations, diaphoresis, dyspnea and peripheral edema. ; ; Respiratory: Negative for cough, wheezing and stridor. ; ; Gastrointestinal: Negative for nausea, vomiting, diarrhea, abdominal pain, blood in stool, hematemesis, jaundice and rectal bleeding. . ; ; Genitourinary: Negative for dysuria, flank pain and hematuria. ; ; GYN:  No pelvic pain, no LOF, no vaginal bleeding, +vaginal discharge, no vulvar pain.;; Musculoskeletal: +LBP. Negative for neck pain. Negative for  swelling and trauma.; ; Skin: Negative for pruritus, rash, abrasions, blisters, bruising and skin lesion.; ; Neuro: Negative for headache, lightheadedness and neck stiffness. Negative for weakness, altered level of consciousness , altered mental status, extremity weakness, paresthesias, involuntary movement, seizure and syncope.       Allergies  Review of patient's allergies indicates no known allergies.  Home Medications   Current Outpatient Rx  Name  Route  Sig  Dispense  Refill  . Prenatal Vit-Fe Fumarate-FA (PRENATAL MULTIVITAMIN) TABS   Oral   Take 1 tablet by mouth daily.           BP 111/55  Pulse 87  Temp(Src) 98.2 F (36.8 C) (Oral)  Resp 18  Ht 5' (1.524 m)  Wt 220 lb (99.791 kg)  BMI 42.97 kg/m2  SpO2 100%  LMP 03/28/2012  Physical Exam 1405: Physical examination:  Nursing notes reviewed; Vital signs and O2 SAT reviewed;  Constitutional: Well developed, Well nourished, Well hydrated, In no acute distress; Head:  Normocephalic, atraumatic; Eyes: EOMI, PERRL, No scleral icterus; ENMT: Mouth and pharynx normal, Mucous membranes moist; Neck: Supple, Full range of motion, No lymphadenopathy; Cardiovascular: Regular rate and rhythm, No murmur, rub, or gallop; Respiratory: Breath sounds clear & equal bilaterally, No rales, rhonchi, wheezes.  Speaking full sentences with ease, Normal respiratory effort/excursion; Chest: Nontender, Movement normal; Abdomen: Soft, Nontender, Nondistended, Normal  bowel sounds.+gravid.; Genitourinary: No CVA tenderness bilat. SVE exam performed with permission of pt and female ED RN assist during exam.  External genitalia w/o lesions. Vaginal vault with thick white discharge.  Cervix posterior and closed, no bleeding, no palpable fetal parts;; Spine:  No midline CS, TS, LS tenderness.  +very mild TTP bilat lower lumbar/SI joint areas.;; Extremities: Pulses normal, No tenderness, No edema, No calf edema or asymmetry.; Neuro: AA&Ox3, Major CN grossly  intact.  Speech clear. Climbs on and off stretcher easily by herself. Gait steady. No gross focal motor or sensory deficits in extremities.; Skin: Color normal, Warm, Dry.   ED Course  Procedures     MDM  MDM Reviewed: previous chart, nursing note and vitals Interpretation: labs   Results for orders placed during the hospital encounter of 11/09/12  WET PREP, GENITAL      Result Value Range   Yeast Wet Prep HPF POC FEW (*) NONE SEEN   Trich, Wet Prep NONE SEEN  NONE SEEN   Clue Cells Wet Prep HPF POC FEW (*) NONE SEEN   WBC, Wet Prep HPF POC MANY (*) NONE SEEN  URINALYSIS, ROUTINE W REFLEX MICROSCOPIC      Result Value Range   Color, Urine YELLOW  YELLOW   APPearance HAZY (*) CLEAR   Specific Gravity, Urine 1.020  1.005 - 1.030   pH 7.0  5.0 - 8.0   Glucose, UA NEGATIVE  NEGATIVE mg/dL   Hgb urine dipstick NEGATIVE  NEGATIVE   Bilirubin Urine NEGATIVE  NEGATIVE   Ketones, ur NEGATIVE  NEGATIVE mg/dL   Protein, ur NEGATIVE  NEGATIVE mg/dL   Urobilinogen, UA 1.0  0.0 - 1.0 mg/dL   Nitrite NEGATIVE  NEGATIVE   Leukocytes, UA LARGE (*) NEGATIVE  URINE MICROSCOPIC-ADD ON      Result Value Range   Squamous Epithelial / LPF FEW (*) RARE   WBC, UA 11-20  <3 WBC/hpf   RBC / HPF 3-6  <3 RBC/hpf   Bacteria, UA MANY (*) RARE   Urine-Other FEW YEAST      1615:  Will tx for UTI (UC pending), BV and candida infections. Will tx mild low back pain symptomatically at this time. Tx constipation symptomatically. Pt has been on toco/FHM with [facility] monitoring: they state pt has not had any contractions and baby appears well. Pt wants to go home now. Dx and testing d/w pt and family.  Questions answered.  Verb understanding, agreeable to d/c home with outpt f/u.           Laray Anger, DO 11/11/12 2021

## 2012-11-09 NOTE — ED Notes (Signed)
States she is having lower back pain that started yesterday at her baby shower.  States her EDC is 01/18/2013.  States she is having sharp pains in her lower abdomen as well.  No bowel movement x4 days.

## 2012-11-10 LAB — URINE CULTURE

## 2012-11-12 ENCOUNTER — Encounter: Payer: BC Managed Care – PPO | Admitting: Advanced Practice Midwife

## 2012-11-15 ENCOUNTER — Encounter (HOSPITAL_COMMUNITY): Payer: Self-pay | Admitting: *Deleted

## 2012-11-17 ENCOUNTER — Encounter: Payer: Self-pay | Admitting: Women's Health

## 2012-11-17 ENCOUNTER — Ambulatory Visit (INDEPENDENT_AMBULATORY_CARE_PROVIDER_SITE_OTHER): Payer: BC Managed Care – PPO | Admitting: Women's Health

## 2012-11-17 VITALS — BP 100/58 | Wt 223.5 lb

## 2012-11-17 DIAGNOSIS — Z331 Pregnant state, incidental: Secondary | ICD-10-CM

## 2012-11-17 DIAGNOSIS — Z1389 Encounter for screening for other disorder: Secondary | ICD-10-CM

## 2012-11-17 DIAGNOSIS — Z3483 Encounter for supervision of other normal pregnancy, third trimester: Secondary | ICD-10-CM

## 2012-11-17 DIAGNOSIS — O09299 Supervision of pregnancy with other poor reproductive or obstetric history, unspecified trimester: Secondary | ICD-10-CM

## 2012-11-17 LAB — POCT URINALYSIS DIPSTICK
Ketones, UA: NEGATIVE
Leukocytes, UA: NEGATIVE

## 2012-11-17 NOTE — Progress Notes (Signed)
Reports good fm. Denies uc's, lof, vb, urinary frequency, urgency, hesitancy, or dysuria.  Reports RLP and LBP- reviewed relief measures. Went to [facility] last week for back pain, dx w/ UTI, BV, yeast infection- taking all meds given, and feeling better. Discussed importance of going to [facility]. Reviewed ptl s/s, fetal kick counts.  All questions answered. F/U in 2wks for visit.

## 2012-11-17 NOTE — Progress Notes (Signed)
Pain in right side and lower back.

## 2012-11-19 ENCOUNTER — Encounter: Payer: BC Managed Care – PPO | Admitting: Obstetrics and Gynecology

## 2012-12-04 ENCOUNTER — Ambulatory Visit (INDEPENDENT_AMBULATORY_CARE_PROVIDER_SITE_OTHER): Payer: BC Managed Care – PPO | Admitting: Obstetrics and Gynecology

## 2012-12-04 VITALS — BP 112/60 | Wt 221.6 lb

## 2012-12-04 DIAGNOSIS — Z3493 Encounter for supervision of normal pregnancy, unspecified, third trimester: Secondary | ICD-10-CM

## 2012-12-04 DIAGNOSIS — Z1389 Encounter for screening for other disorder: Secondary | ICD-10-CM

## 2012-12-04 DIAGNOSIS — O99019 Anemia complicating pregnancy, unspecified trimester: Secondary | ICD-10-CM

## 2012-12-04 DIAGNOSIS — Z331 Pregnant state, incidental: Secondary | ICD-10-CM

## 2012-12-04 DIAGNOSIS — O09299 Supervision of pregnancy with other poor reproductive or obstetric history, unspecified trimester: Secondary | ICD-10-CM

## 2012-12-04 LAB — POCT URINALYSIS DIPSTICK
Glucose, UA: NEGATIVE
Leukocytes, UA: NEGATIVE
Nitrite, UA: NEGATIVE

## 2012-12-04 NOTE — Progress Notes (Signed)
No c/o , Classes encouraged

## 2012-12-04 NOTE — Patient Instructions (Signed)
Fetal Movement Counts Patient Name: __________________________________________________ Patient Due Date: ____________________ Performing a fetal movement count is highly recommended in high-risk pregnancies, but it is good for every pregnant woman to do. Your caregiver may ask you to start counting fetal movements at 28 weeks of the pregnancy. Fetal movements often increase:  After eating a full meal.  After physical activity.  After eating or drinking something sweet or cold.  At rest. Pay attention to when you feel the baby is most active. This will help you notice a pattern of your baby's sleep and wake cycles and what factors contribute to an increase in fetal movement. It is important to perform a fetal movement count at the same time each day when your baby is normally most active.  HOW TO COUNT FETAL MOVEMENTS 1. Find a quiet and comfortable area to sit or lie down on your left side. Lying on your left side provides the best blood and oxygen circulation to your baby. 2. Write down the day and time on a sheet of paper or in a journal. 3. Start counting kicks, flutters, swishes, rolls, or jabs in a 2 hour period. You should feel at least 10 movements within 2 hours. 4. If you do not feel 10 movements in 2 hours, wait 2 3 hours and count again. Look for a change in the pattern or not enough counts in 2 hours. SEEK MEDICAL CARE IF:  You feel less than 10 counts in 2 hours, tried twice.  There is no movement in over an hour.  The pattern is changing or taking longer each day to reach 10 counts in 2 hours.  You feel the baby is not moving as he or she usually does. Date: ____________ Movements: ____________ Start time: ____________ Finish time: ____________  Date: ____________ Movements: ____________ Start time: ____________ Finish time: ____________ Date: ____________ Movements: ____________ Start time: ____________ Finish time: ____________ Date: ____________ Movements: ____________  Start time: ____________ Finish time: ____________ Date: ____________ Movements: ____________ Start time: ____________ Finish time: ____________ Date: ____________ Movements: ____________ Start time: ____________ Finish time: ____________ Date: ____________ Movements: ____________ Start time: ____________ Finish time: ____________ Date: ____________ Movements: ____________ Start time: ____________ Finish time: ____________  Date: ____________ Movements: ____________ Start time: ____________ Finish time: ____________ Date: ____________ Movements: ____________ Start time: ____________ Finish time: ____________ Date: ____________ Movements: ____________ Start time: ____________ Finish time: ____________ Date: ____________ Movements: ____________ Start time: ____________ Finish time: ____________ Date: ____________ Movements: ____________ Start time: ____________ Finish time: ____________ Date: ____________ Movements: ____________ Start time: ____________ Finish time: ____________ Date: ____________ Movements: ____________ Start time: ____________ Finish time: ____________  Date: ____________ Movements: ____________ Start time: ____________ Finish time: ____________ Date: ____________ Movements: ____________ Start time: ____________ Finish time: ____________ Date: ____________ Movements: ____________ Start time: ____________ Finish time: ____________ Date: ____________ Movements: ____________ Start time: ____________ Finish time: ____________ Date: ____________ Movements: ____________ Start time: ____________ Finish time: ____________ Date: ____________ Movements: ____________ Start time: ____________ Finish time: ____________ Date: ____________ Movements: ____________ Start time: ____________ Finish time: ____________  Date: ____________ Movements: ____________ Start time: ____________ Finish time: ____________ Date: ____________ Movements: ____________ Start time: ____________ Finish time:  ____________ Date: ____________ Movements: ____________ Start time: ____________ Finish time: ____________ Date: ____________ Movements: ____________ Start time: ____________ Finish time: ____________ Date: ____________ Movements: ____________ Start time: ____________ Finish time: ____________ Date: ____________ Movements: ____________ Start time: ____________ Finish time: ____________ Date: ____________ Movements: ____________ Start time: ____________ Finish time: ____________  Date: ____________ Movements: ____________ Start time: ____________ Finish   time: ____________ Date: ____________ Movements: ____________ Start time: ____________ Finish time: ____________ Date: ____________ Movements: ____________ Start time: ____________ Finish time: ____________ Date: ____________ Movements: ____________ Start time: ____________ Finish time: ____________ Date: ____________ Movements: ____________ Start time: ____________ Finish time: ____________ Date: ____________ Movements: ____________ Start time: ____________ Finish time: ____________ Date: ____________ Movements: ____________ Start time: ____________ Finish time: ____________  Date: ____________ Movements: ____________ Start time: ____________ Finish time: ____________ Date: ____________ Movements: ____________ Start time: ____________ Finish time: ____________ Date: ____________ Movements: ____________ Start time: ____________ Finish time: ____________ Date: ____________ Movements: ____________ Start time: ____________ Finish time: ____________ Date: ____________ Movements: ____________ Start time: ____________ Finish time: ____________ Date: ____________ Movements: ____________ Start time: ____________ Finish time: ____________ Date: ____________ Movements: ____________ Start time: ____________ Finish time: ____________  Date: ____________ Movements: ____________ Start time: ____________ Finish time: ____________ Date: ____________ Movements:  ____________ Start time: ____________ Finish time: ____________ Date: ____________ Movements: ____________ Start time: ____________ Finish time: ____________ Date: ____________ Movements: ____________ Start time: ____________ Finish time: ____________ Date: ____________ Movements: ____________ Start time: ____________ Finish time: ____________ Date: ____________ Movements: ____________ Start time: ____________ Finish time: ____________ Date: ____________ Movements: ____________ Start time: ____________ Finish time: ____________  Date: ____________ Movements: ____________ Start time: ____________ Finish time: ____________ Date: ____________ Movements: ____________ Start time: ____________ Finish time: ____________ Date: ____________ Movements: ____________ Start time: ____________ Finish time: ____________ Date: ____________ Movements: ____________ Start time: ____________ Finish time: ____________ Date: ____________ Movements: ____________ Start time: ____________ Finish time: ____________ Date: ____________ Movements: ____________ Start time: ____________ Finish time: ____________ Document Released: 06/13/2006 Document Revised: 04/30/2012 Document Reviewed: 03/10/2012 [employer] Patient Information 2014 [employer].  

## 2012-12-04 NOTE — Progress Notes (Signed)
C/o "a lot of lower abdominal pressure."

## 2012-12-19 ENCOUNTER — Encounter: Payer: Self-pay | Admitting: Obstetrics and Gynecology

## 2012-12-19 ENCOUNTER — Ambulatory Visit (INDEPENDENT_AMBULATORY_CARE_PROVIDER_SITE_OTHER): Payer: BC Managed Care – PPO | Admitting: Obstetrics and Gynecology

## 2012-12-19 VITALS — BP 100/60 | Temp 98.5°F | Wt 223.6 lb

## 2012-12-19 DIAGNOSIS — O99019 Anemia complicating pregnancy, unspecified trimester: Secondary | ICD-10-CM

## 2012-12-19 DIAGNOSIS — O09299 Supervision of pregnancy with other poor reproductive or obstetric history, unspecified trimester: Secondary | ICD-10-CM

## 2012-12-19 DIAGNOSIS — Z3483 Encounter for supervision of other normal pregnancy, third trimester: Secondary | ICD-10-CM

## 2012-12-19 DIAGNOSIS — Z1389 Encounter for screening for other disorder: Secondary | ICD-10-CM

## 2012-12-19 LAB — POCT URINALYSIS DIPSTICK
Glucose, UA: NEGATIVE
Nitrite, UA: NEGATIVE

## 2012-12-19 NOTE — Progress Notes (Signed)
Pt here today for routine visit. Pt states she has good fetal movement. Pt states that the pressure is the same as before no change. The pt states she had a fever yesterday of 101 and a sore throat, she stated she took tylenol and it went away. No fever today. Pt denies any other problems or concerns at this time.  Good fm, no pih sx. O throat: no erythema, no exudate. A stable iup 35+wk jvf

## 2012-12-30 ENCOUNTER — Encounter: Payer: Self-pay | Admitting: Obstetrics & Gynecology

## 2012-12-30 ENCOUNTER — Ambulatory Visit (INDEPENDENT_AMBULATORY_CARE_PROVIDER_SITE_OTHER): Payer: BC Managed Care – PPO | Admitting: Obstetrics & Gynecology

## 2012-12-30 VITALS — BP 100/80 | Wt 227.0 lb

## 2012-12-30 DIAGNOSIS — O99019 Anemia complicating pregnancy, unspecified trimester: Secondary | ICD-10-CM

## 2012-12-30 DIAGNOSIS — O09299 Supervision of pregnancy with other poor reproductive or obstetric history, unspecified trimester: Secondary | ICD-10-CM

## 2012-12-30 DIAGNOSIS — Z1389 Encounter for screening for other disorder: Secondary | ICD-10-CM

## 2012-12-30 DIAGNOSIS — Z331 Pregnant state, incidental: Secondary | ICD-10-CM

## 2012-12-30 DIAGNOSIS — Z3483 Encounter for supervision of other normal pregnancy, third trimester: Secondary | ICD-10-CM

## 2012-12-30 LAB — POCT URINALYSIS DIPSTICK
Blood, UA: NEGATIVE
Glucose, UA: NEGATIVE
Ketones, UA: NEGATIVE
Leukocytes, UA: NEGATIVE
Nitrite, UA: NEGATIVE
Protein, UA: NEGATIVE

## 2012-12-30 NOTE — Patient Instructions (Signed)
Epidural Risks and Benefits The continuous putting in (infusion) of local anesthetics through a long, narrow, hollow plastic tube (catheter)/needle into the lower (lumbar) area of your spine is commonly called an epidural. This means outside the covering of the spinal cord. The epidural catheter is placed in the space on the outside of the membrane that covers the spinal cord. The anesthetic medicine numbs the nerves of the spinal cord in the epidural space. There is also a spinal/epidural anesthetic using two needles and a catheter. The medication is first placed in the spinal canal. Then that needle is removed and a catheter is placed in the epidural space through the second needle for continuous anesthesia. This seems to be the most popular type of regional anesthesia used now. This is sometimes given for pain management to women who are giving birth. Spinal and epidural anesthesia are called regional anesthesia because they numb a certain region of the body. While it is an effective pain management tool, some reasons not to use this include:  Restricted mobility: The tubes and monitors connected to you do not allow for much moving around.  Increased likelihood of bladder catheterization, oxytocin administration, and internal monitoring. This means a tube (catheter) may have to be put into the bladder to drain the urine. Uterine contractions can become weaker and less frequent. They also may have a higher use of oxytocin than mothers not having regional anesthesia.  Increased likelihood of operative delivery: This includes the use of or need for forceps, vacuum extractor, episiotomy, or cesarean delivery. When the dose is too large, or when it sinks down into the "tailbone" (sacral) region of the body, the perineum and the birth canal (vagina) are anesthetized. Anesthetic is injected into this area late in labor to deaden all sensation. When it "accidentally" happens earlier in labor, the muscles of the  pelvic floor are relaxed too early. This interferes with the normal flexion and rotation of the baby's head as it passes through the birth canal. This interference can lead to abnormal presentations that are more dangerous for the baby.  Must use an automatic blood pressure cuff throughout labor. This is a cuff that automatically takes your blood pressure at regular intervals. SHORT TERM MATERNAL RISKS  Dural puncture - The dura is one of the membranes surrounding the spinal cord. If the anesthetic medication gets into the spinal canal through a dural puncture, it can result in a spinal anesthetic and spinal headache. Spinal headaches are treated with an epidural blood patch to cover the punctured area.  Low blood pressure (hypotension) - Nearly one third of women with an epidural will develop low blood pressure. The ways that patients must lay during the epidural can make this worse. Their position is limited because they will be unable to move their legs easily for the time of the anesthetic. Low blood pressure is also a risk for the baby. If the baby does not get enough oxygen from the mom's blood, it can result in an emergency Cesarean section. This means the baby is delivered by an operation through a cut by the surgeon (incision) on the belly of the mother.  Nausea, vomiting, and prolonged shivering.  Prolonged labor - With large doses of anesthetic medication, the patient loses the desire and the ability to bear down and push. This results in an increased use of forceps and vacuum extractions, compared to women having unmedicated deliveries.  Uneven, incomplete or non-existent pain relief. Sometimes the epidural does not work well and   additional medications may be needed for pain relief.  Difficulty breathing well or paralysis if the level of anesthesia goes too high in the spine.  Convulsions - If the anesthetic agent accidentally is injected into a blood vessel it can cause convulsions and  loss of consciousness.  Toxic drug reactions.  Septic meningitis - An abscess can form at the site where the epidural catheter is placed. If this spreads into the spinal canal it can cause meningitis.  Allergic reaction - This causes blood pressure to become too low and other medications and fluids must be given to bring the blood pressure up. Also rashes and difficulty breathing may develop.  Cardiac arrest - This is rare but real threat to the life of the mother and baby.  Fever is common.  Itching that is easily treated.  Spinal hematoma. LONG TERM MATERNAL RISKS  Neurological complications - A nerve problem called Horner's syndrome can develop with epidural anesthesia for vaginal delivery. It is impossible to predict which patients will develop a Horner's syndrome. Even the nerves to the face can be blocked, temporarily or permanently. Tremors and shakes can occur.  Paresthesia ("pins and needles"). This is a feeling that comes from inflammation of a nerve.  Dizziness and fainting can become a problem after epidurals. This is usually only for a couple of days. RISKS TO BABY  Direct drug toxicity.  Fetal distress, abnormal fetal heart rate (FHR) (can lead to emergency cesarean). This is especially true if the anesthetic gets into the mother's blood stream or too much medication is put into the epidural. REASONS NOT TO HAVE EPIDURAL ANESTHESIA  Increased costs.  The mother has a low blood pressure.  There are blood clotting problems.  A brain tumor is present.  There is an infection in the blood stream.  A skin infection at the needle site.  A tattoo at the needle site. BENEFITS  Regional anesthesia is the most effective pain relief for labor and delivery.  It is the best anesthetic for preeclampsia and eclampsia.  There is better pain control after delivery (vaginal or cesarean).  When done correctly, no medication gets to the baby.  Sooner ambulation after  delivery.  It can be left in place during all of labor.  You can be awake during a Cesarean delivery and see the baby immediately after delivery. AFTER THE PROCEDURE   You will be kept in bed for several hours to prevent headaches.  You will be kept in bed until your legs are no longer numb and it is safe to walk.  The length of time you spend in the hospital will depend on the type of surgery or procedure you have had.  The epidural catheter is removed after you no longer need it for pain. HOME CARE INSTRUCTIONS   Do not drive or operate any kind of machinery for at least 24 hours. Make sure there is someone to drive you home.  Do not drink alcohol for at least 24 hours after the anesthesia.  Do not make important decisions for at least 24 hours after the anesthesia.  Drink lots of fluids.  Return to your normal diet.  Keep all your postoperative appointments as scheduled. SEEK IMMEDIATE MEDICAL CARE IF:  You develop a fever or temperature over 98.6 F (37 C).  You have a persistent headache.  You develop dizziness, fainting or lightheadedness.  You develop weakness, numbness or tingling in your arms or legs.  You have a skin rash.  You   have difficulty breathing  You have a stiff neck with or without stiff back.  You develop chest pain. Document Released: 05/14/2005 Document Revised: 08/06/2011 Document Reviewed: 06/21/2008 [employer] Patient Information 2014 [employer].  

## 2012-12-30 NOTE — Progress Notes (Signed)
BP weight and urine results all reviewed and noted. Patient reports good fetal movement, denies any bleeding and no rupture of membranes symptoms or regular contractions. Patient is without complaints. All questions were answered.  

## 2012-12-30 NOTE — Progress Notes (Signed)
HAVING a lot of pain and pressure.

## 2013-01-08 ENCOUNTER — Ambulatory Visit (INDEPENDENT_AMBULATORY_CARE_PROVIDER_SITE_OTHER): Payer: BC Managed Care – PPO | Admitting: Obstetrics & Gynecology

## 2013-01-08 ENCOUNTER — Encounter: Payer: Self-pay | Admitting: Obstetrics & Gynecology

## 2013-01-08 VITALS — BP 100/60 | Wt 226.0 lb

## 2013-01-08 DIAGNOSIS — O99019 Anemia complicating pregnancy, unspecified trimester: Secondary | ICD-10-CM

## 2013-01-08 DIAGNOSIS — O3410 Maternal care for benign tumor of corpus uteri, unspecified trimester: Secondary | ICD-10-CM

## 2013-01-08 DIAGNOSIS — O09299 Supervision of pregnancy with other poor reproductive or obstetric history, unspecified trimester: Secondary | ICD-10-CM

## 2013-01-08 DIAGNOSIS — Z1389 Encounter for screening for other disorder: Secondary | ICD-10-CM

## 2013-01-08 DIAGNOSIS — Z3480 Encounter for supervision of other normal pregnancy, unspecified trimester: Secondary | ICD-10-CM

## 2013-01-08 DIAGNOSIS — O210 Mild hyperemesis gravidarum: Secondary | ICD-10-CM

## 2013-01-08 LAB — POCT URINALYSIS DIPSTICK
Ketones, UA: NEGATIVE
Nitrite, UA: NEGATIVE

## 2013-01-08 NOTE — Progress Notes (Signed)
BP weight and urine results all reviewed and noted. Patient reports good fetal movement, denies any bleeding and no rupture of membranes symptoms or regular contractions. Patient is without complaints. All questions were answered.  

## 2013-01-08 NOTE — Addendum Note (Signed)
Addended by: Richardson Chiquito on: 01/08/2013 04:18 PM   Modules accepted: Orders

## 2013-01-14 ENCOUNTER — Encounter (HOSPITAL_COMMUNITY): Payer: Self-pay

## 2013-01-14 ENCOUNTER — Inpatient Hospital Stay (HOSPITAL_COMMUNITY)
Admission: AD | Admit: 2013-01-14 | Discharge: 2013-01-14 | Disposition: A | Discharge status: Home / Self Care | Payer: Medicaid Other | Source: Ambulatory Visit | Attending: Obstetrics and Gynecology | Admitting: Obstetrics and Gynecology

## 2013-01-14 DIAGNOSIS — O479 False labor, unspecified: Secondary | ICD-10-CM | POA: Insufficient documentation

## 2013-01-14 NOTE — MAU Note (Signed)
Patient states having uc's that started at 5:50 pm every 7 minutes. Feels them in her lower abdomen and back.

## 2013-01-14 NOTE — Discharge Instructions (Signed)

## 2013-01-14 NOTE — MAU Note (Signed)
Pt G2 P0 at 39.3wks having contractions since 1750.  Denies bleeding or leaking.  No problems with pregnancy.

## 2013-01-15 ENCOUNTER — Encounter (HOSPITAL_COMMUNITY): Payer: Self-pay | Admitting: *Deleted

## 2013-01-15 ENCOUNTER — Inpatient Hospital Stay (HOSPITAL_COMMUNITY): Payer: Medicaid Other | Admitting: Anesthesiology

## 2013-01-15 ENCOUNTER — Encounter (HOSPITAL_COMMUNITY): Payer: Self-pay | Admitting: Anesthesiology

## 2013-01-15 ENCOUNTER — Inpatient Hospital Stay (HOSPITAL_COMMUNITY)
Admission: AD | Admit: 2013-01-15 | Discharge: 2013-01-17 | DRG: 775 | Disposition: A | Discharge status: Home / Self Care | Payer: Medicaid Other | Source: Ambulatory Visit | Attending: Obstetrics and Gynecology | Admitting: Obstetrics and Gynecology

## 2013-01-15 HISTORY — DX: Urinary tract infection, site not specified: N39.0

## 2013-01-15 HISTORY — DX: Leiomyoma of uterus, unspecified: D25.9

## 2013-01-15 HISTORY — DX: Other specified bacterial agents as the cause of diseases classified elsewhere: B96.89

## 2013-01-15 HISTORY — DX: Maternal care for benign tumor of corpus uteri, unspecified trimester: O34.10

## 2013-01-15 HISTORY — DX: Candidiasis, unspecified: B37.9

## 2013-01-15 HISTORY — DX: Acute vaginitis: N76.0

## 2013-01-15 LAB — CBC
HCT: 36 % (ref 36.0–46.0)
Hemoglobin: 12.5 g/dL (ref 12.0–15.0)
MCH: 31.9 pg (ref 26.0–34.0)
MCHC: 34.7 g/dL (ref 30.0–36.0)

## 2013-01-15 LAB — PREPARE RBC (CROSSMATCH)

## 2013-01-15 LAB — OB RESULTS CONSOLE GBS: GBS: NEGATIVE

## 2013-01-15 MED ORDER — TETANUS-DIPHTH-ACELL PERTUSSIS 5-2.5-18.5 LF-MCG/0.5 IM SUSP
0.5000 mL | Freq: Once | INTRAMUSCULAR | Status: AC
  Administered 2013-01-16: 0.5 mL via INTRAMUSCULAR

## 2013-01-15 MED ORDER — ONDANSETRON HCL 4 MG PO TABS: 4.0000 mg | ORAL_TABLET | ORAL | Status: DC | PRN

## 2013-01-15 MED ORDER — IBUPROFEN 600 MG PO TABS: 600.0000 mg | ORAL_TABLET | Freq: Four times a day (QID) | ORAL | Status: DC | PRN

## 2013-01-15 MED ORDER — OXYCODONE-ACETAMINOPHEN 5-325 MG PO TABS: 1.0000 | ORAL_TABLET | ORAL | Status: DC | PRN

## 2013-01-15 MED ORDER — PHENYLEPHRINE 40 MCG/ML (10ML) SYRINGE FOR IV PUSH (FOR BLOOD PRESSURE SUPPORT)
80.0000 ug | PREFILLED_SYRINGE | INTRAVENOUS | Status: DC | PRN
  Filled 2013-01-15: qty 2

## 2013-01-15 MED ORDER — FLEET ENEMA 7-19 GM/118ML RE ENEM: 1.0000 | ENEMA | RECTAL | Status: DC | PRN

## 2013-01-15 MED ORDER — CITRIC ACID-SODIUM CITRATE 334-500 MG/5ML PO SOLN
30.0000 mL | ORAL | Status: DC | PRN
  Filled 2013-01-15: qty 15

## 2013-01-15 MED ORDER — BENZOCAINE-MENTHOL 20-0.5 % EX AERO
1.0000 "application " | INHALATION_SPRAY | CUTANEOUS | Status: DC | PRN
  Administered 2013-01-16: 1 via TOPICAL
  Filled 2013-01-15: qty 56

## 2013-01-15 MED ORDER — OXYTOCIN BOLUS FROM INFUSION
500.0000 mL | INTRAVENOUS | Status: DC
  Administered 2013-01-15: 500 mL via INTRAVENOUS

## 2013-01-15 MED ORDER — WITCH HAZEL-GLYCERIN EX PADS: 1.0000 "application " | MEDICATED_PAD | CUTANEOUS | Status: DC | PRN

## 2013-01-15 MED ORDER — LACTATED RINGERS IV SOLN
500.0000 mL | INTRAVENOUS | Status: DC | PRN
  Administered 2013-01-15 (×2): 500 mL via INTRAVENOUS

## 2013-01-15 MED ORDER — ACETAMINOPHEN 325 MG PO TABS: 650.0000 mg | ORAL_TABLET | ORAL | Status: DC | PRN

## 2013-01-15 MED ORDER — LACTATED RINGERS IV SOLN
INTRAVENOUS | Status: DC
  Administered 2013-01-15 (×4): via INTRAVENOUS

## 2013-01-15 MED ORDER — PHENYLEPHRINE 40 MCG/ML (10ML) SYRINGE FOR IV PUSH (FOR BLOOD PRESSURE SUPPORT)
80.0000 ug | PREFILLED_SYRINGE | INTRAVENOUS | Status: DC | PRN
  Filled 2013-01-15: qty 2
  Filled 2013-01-15: qty 5

## 2013-01-15 MED ORDER — ONDANSETRON HCL 4 MG/2ML IJ SOLN: 4.0000 mg | INTRAMUSCULAR | Status: DC | PRN

## 2013-01-15 MED ORDER — DIBUCAINE 1 % RE OINT: 1.0000 "application " | TOPICAL_OINTMENT | RECTAL | Status: DC | PRN

## 2013-01-15 MED ORDER — OXYTOCIN 40 UNITS IN LACTATED RINGERS INFUSION - SIMPLE MED
1.0000 m[IU]/min | INTRAVENOUS | Status: DC
  Administered 2013-01-15: 2 m[IU]/min via INTRAVENOUS

## 2013-01-15 MED ORDER — SENNOSIDES-DOCUSATE SODIUM 8.6-50 MG PO TABS
2.0000 | ORAL_TABLET | Freq: Every day | ORAL | Status: DC
  Administered 2013-01-15 – 2013-01-16 (×2): 2 via ORAL

## 2013-01-15 MED ORDER — PRENATAL MULTIVITAMIN CH
1.0000 | ORAL_TABLET | Freq: Every day | ORAL | Status: DC
  Administered 2013-01-16 – 2013-01-17 (×2): 1 via ORAL
  Filled 2013-01-15 (×2): qty 1

## 2013-01-15 MED ORDER — ZOLPIDEM TARTRATE 5 MG PO TABS: 5.0000 mg | ORAL_TABLET | Freq: Every evening | ORAL | Status: DC | PRN

## 2013-01-15 MED ORDER — EPHEDRINE 5 MG/ML INJ
10.0000 mg | INTRAVENOUS | Status: DC | PRN
  Filled 2013-01-15: qty 2

## 2013-01-15 MED ORDER — IBUPROFEN 600 MG PO TABS
600.0000 mg | ORAL_TABLET | Freq: Four times a day (QID) | ORAL | Status: DC
  Administered 2013-01-16 – 2013-01-17 (×7): 600 mg via ORAL
  Filled 2013-01-15 (×7): qty 1

## 2013-01-15 MED ORDER — TERBUTALINE SULFATE 1 MG/ML IJ SOLN: 0.2500 mg | Freq: Once | INTRAMUSCULAR | Status: DC | PRN

## 2013-01-15 MED ORDER — LIDOCAINE HCL (PF) 1 % IJ SOLN
30.0000 mL | INTRAMUSCULAR | Status: DC | PRN
  Filled 2013-01-15 (×2): qty 30

## 2013-01-15 MED ORDER — FENTANYL 2.5 MCG/ML BUPIVACAINE 1/10 % EPIDURAL INFUSION (WH - ANES)
14.0000 mL/h | INTRAMUSCULAR | Status: DC | PRN
  Administered 2013-01-15 (×3): 14 mL/h via EPIDURAL
  Filled 2013-01-15 (×3): qty 125

## 2013-01-15 MED ORDER — LANOLIN HYDROUS EX OINT: TOPICAL_OINTMENT | CUTANEOUS | Status: DC | PRN

## 2013-01-15 MED ORDER — DIPHENHYDRAMINE HCL 50 MG/ML IJ SOLN: 12.5000 mg | INTRAMUSCULAR | Status: DC | PRN

## 2013-01-15 MED ORDER — OXYTOCIN 40 UNITS IN LACTATED RINGERS INFUSION - SIMPLE MED
62.5000 mL/h | INTRAVENOUS | Status: DC
  Filled 2013-01-15: qty 1000

## 2013-01-15 MED ORDER — EPHEDRINE 5 MG/ML INJ
10.0000 mg | INTRAVENOUS | Status: DC | PRN
  Filled 2013-01-15: qty 2
  Filled 2013-01-15: qty 4

## 2013-01-15 MED ORDER — SIMETHICONE 80 MG PO CHEW: 80.0000 mg | CHEWABLE_TABLET | ORAL | Status: DC | PRN

## 2013-01-15 MED ORDER — ONDANSETRON HCL 4 MG/2ML IJ SOLN: 4.0000 mg | Freq: Four times a day (QID) | INTRAMUSCULAR | Status: DC | PRN

## 2013-01-15 MED ORDER — DIPHENHYDRAMINE HCL 25 MG PO CAPS: 25.0000 mg | ORAL_CAPSULE | Freq: Four times a day (QID) | ORAL | Status: DC | PRN

## 2013-01-15 MED ORDER — SODIUM BICARBONATE 8.4 % IV SOLN
INTRAVENOUS | Status: DC | PRN
  Administered 2013-01-15: 5 mL via EPIDURAL

## 2013-01-15 MED ORDER — LACTATED RINGERS IV SOLN
500.0000 mL | Freq: Once | INTRAVENOUS | Status: AC
  Administered 2013-01-15: 500 mL via INTRAVENOUS

## 2013-01-15 NOTE — Progress Notes (Signed)
Jacqueline Underwood is a 22 y.o. G2P0010 at [redacted]w[redacted]d  admitted for active labor  Subjective:  Pt doing well. Feeling more pressure. Comfortable with epidural.  Objective: BP 110/65  Pulse 71  Temp(Src) 98.2 F (36.8 C) (Oral)  Resp 20  Ht 5' (1.524 m)  Wt 103.602 kg (228 lb 6.4 oz)  BMI 44.61 kg/m2  SpO2 100%  LMP 03/28/2012      FHT:  FHR: 145 bpm, variability: periods of minimal and periods of moderate,  accelerations:  Abscent,  decelerations:  Present one prolonged decel at 1300 and one at 1545 with feeling posiiton during a SVE UC:   Adequate MVUs  SVE:   Dilation: 8.5 Effacement (%): 90 Station: +1 Exam by:: Jacqueline Guarisco, md  Labs: Lab Results  Component Value Date   WBC 12.4* 01/15/2013   HGB 12.5 01/15/2013   HCT 36.0 01/15/2013   MCV 91.8 01/15/2013   PLT 173 01/15/2013    Assessment / Plan: Protracted active phase  Labor: progressing but slowly on pitocin.  adequate mvus. tight pubic arch but adequate room posteriorly. LOA baby.  will cont to monitor. Preeclampsia:  na Fetal Wellbeing:  Category II Pain Control:  Epidural I/D:  n/a Anticipated MOD:  NSVD  Jacqueline Underwood L 01/15/2013, 4:00 PM

## 2013-01-15 NOTE — MAU Note (Signed)
Pt having contractions every .  Clear fluid trickle since 2300.

## 2013-01-15 NOTE — Progress Notes (Signed)
Jacqueline Underwood is a 22 y.o. G2P0010 at [redacted]w[redacted]d admitted for SOL  Subjective: Comfortable and having  Contractions. FM+   Objective: BP 116/69  Pulse 73  Temp(Src) 98.7 F (37.1 C) (Oral)  Resp 24  Ht 5' (1.524 m)  Wt 103.602 kg (228 lb 6.4 oz)  BMI 44.61 kg/m2  SpO2 99%  LMP 03/28/2012      FHT:  FHR: 145 bpm, variability: moderate,  accelerations:  Present,  decelerations:  Present early decels UC:   regular, every 2-3 minutes SVE:   Dilation: 8 Effacement (%): 100 Station: +1 Exam by:: Marijean Heath, RN  Labs: Lab Results  Component Value Date   WBC 12.4* 01/15/2013   HGB 12.5 01/15/2013   HCT 36.0 01/15/2013   MCV 91.8 01/15/2013   PLT 173 01/15/2013    Assessment / Plan: Augmentation of labor, progressing well  Labor: Progressing normally Preeclampsia:  N/A Fetal Wellbeing:  Category I Pain Control:  Epidural I/D:  n/a Anticipated MOD:  NSVD  [name], MD 01/15/2013, 2:52 PM

## 2013-01-15 NOTE — Consult Note (Signed)
Neonatology Note:   Attendance at Delivery:    I was asked by Dr. Dolan Amen to attend this NSVD at term due to thick meconium. The mother is a G2P0A1 O pos, GBS neg with fibroids. ROM 15 hours prior to delivery, fluid with thick meconium. At delivery, infant vigorous with good spontaneous cry and tone. Needed only minimal bulb suctioning of thin, light green fluid. Ap 8/9. Lungs clear to ausc in DR. To CN to care of Pediatrician.   Doretha Sou, MD

## 2013-01-15 NOTE — H&P (Signed)
Jacqueline Underwood is a 22 y.o. female presenting for active labor. Pt has been having painful contractions throughout the day.  History OB History   Grav Para Term Preterm Abortions TAB SAB Ect Mult Living   2    1  1         Past Medical History  Diagnosis Date  . Pregnant   . BV (bacterial vaginosis)   . Yeast infection   . UTI (urinary tract infection)   . Uterine fibroid in pregnancy    Past Surgical History  Procedure Laterality Date  . No past surgeries     Family History: family history includes Diabetes in her maternal grandmother; Hypertension in her father. Social History:  reports that she has never smoked. She has never used smokeless tobacco. She reports that she does not drink alcohol or use illicit drugs.   Prenatal Transfer Tool  Maternal Diabetes: No Genetic Screening: Normal Maternal Ultrasounds/Referrals: Normal Fetal Ultrasounds or other Referrals:  None Maternal Substance Abuse:  No Significant Maternal Medications:  None Significant Maternal Lab Results:  None Other Comments:  None  ROS +FM, no LOF, no VB and +Ctx  No f/c, sob, n/v, d/c, headache, weakness.  Dilation: 7 Effacement (%): 100 Station: -1 Exam by:: dr Talin Feister Blood pressure 124/70, pulse 78, temperature 97.5 F (36.4 C), temperature source Oral, resp. rate 18, height 5' (1.524 m), weight 103.602 kg (228 lb 6.4 oz), last menstrual period 03/28/2012, SpO2 98.00%. Exam Physical Exam  Nursing note and vitals reviewed. Constitutional: She is oriented to person, place, and time. She appears well-developed and well-nourished. No distress.  HENT:  Head: Normocephalic and atraumatic.  Eyes: Conjunctivae and EOM are normal. Right eye exhibits no discharge. Left eye exhibits no discharge.  Neck: Normal range of motion. Neck supple.  Cardiovascular: Normal rate, regular rhythm, normal heart sounds and intact distal pulses.  Exam reveals no gallop and no friction rub.   No murmur  heard. Respiratory: Effort normal and breath sounds normal. No respiratory distress. She has no wheezes. She has no rales. She exhibits no tenderness.  GI: Soft. Bowel sounds are normal. She exhibits no distension and no mass. There is no tenderness. There is no rebound and no guarding.  Neurological: She is alert and oriented to person, place, and time.  Skin: She is not diaphoretic.  Psychiatric: She has a normal mood and affect. Her behavior is normal. Judgment and thought content normal.    Prenatal labs: ABO, Rh: --/--/O POS (03/24 0905) Antibody: NEG (05/27 0949) Rubella: Nonimmune (01/03 0000) RPR: NON REAC (05/27 0949)  HBsAg: Negative (01/03 0000)  HIV: NON REACTIVE (05/27 0949)  GBS:   Neg  FHT: 130s mod variability + accels no decels Toco: q2-55min Assessment/Plan: Jacqueline Underwood is a 22 y.o. G2P0010 at [redacted]w[redacted]d presents in active labor. LaborAdmitPlan #Labor: In active labor, now s/p AROM #Pain: S/p Epidural  #FWB: Cat I, reactive and reassuring #ID: GBS neg #MOF:Breast #MOC: nexplanon  Jacqueline Underwood 01/15/2013, 3:31 AM

## 2013-01-15 NOTE — Anesthesia Preprocedure Evaluation (Signed)

## 2013-01-15 NOTE — Progress Notes (Addendum)
Jacqueline Underwood is a 22 y.o. G2P0010 at [redacted]w[redacted]d admitted for SOL.  Subjective: Currently having painful contractions. Contractions not picking up on toco.   Objective: BP 128/84  Pulse 95  Temp(Src) 98.1 F (36.7 C) (Oral)  Resp 20  Ht 5' (1.524 m)  Wt 103.602 kg (228 lb 6.4 oz)  BMI 44.61 kg/m2  SpO2 97%  LMP 03/28/2012      FHT:  FHR: 135 bpm, variability: moderate,  accelerations:  Present,  decelerations:  Absent UC:   irregular, every 2-4 minutes SVE:   Dilation: 9 Effacement (%): 100 Station: +1   Labs: Lab Results  Component Value Date   WBC 12.4* 01/15/2013   HGB 12.5 01/15/2013   HCT 36.0 01/15/2013   MCV 91.8 01/15/2013   PLT 173 01/15/2013    Assessment / Plan: Augmentation of labor, progressing well  Labor: Progressing normally Preeclampsia:  N/A Fetal Wellbeing:  Category I Pain Control:  Epidural I/D:  n/a Anticipated MOD:  NSVD  Jacqueline Underwood 01/15/2013, 10:16 AM

## 2013-01-15 NOTE — Progress Notes (Signed)
Jacqueline Underwood is a 22 y.o. G2P0010 at [redacted]w[redacted]d admitted for active labor  Subjective:   Objective: BP 124/65  Pulse 77  Temp(Src) 98.3 F (36.8 C) (Oral)  Resp 18  Ht 5' (1.524 m)  Wt 103.602 kg (228 lb 6.4 oz)  BMI 44.61 kg/m2  SpO2 97%  LMP 03/28/2012      FHT:  FHR: 130s bpm, variability: moderate,  accelerations:  Abscent,  decelerations:  Absent UC:   regular, every 2-3 minutes SVE:   Dilation: 7 Effacement (%): 100 Station: -1 Exam by:: e. poore, rn @0530  unchanged Labs: Lab Results  Component Value Date   WBC 12.4* 01/15/2013   HGB 12.5 01/15/2013   HCT 36.0 01/15/2013   MCV 91.8 01/15/2013   PLT 173 01/15/2013    Assessment / Plan: Augmentation of labor, progressing well  Labor: no change with frequent ctx and AROM, starting Pit Preeclampsia:  no signs or symptoms of toxicity Fetal Wellbeing:  Category I Pain Control:  Epidural I/D:  n/a Anticipated MOD:  NSVD  Jacqueline Underwood 01/15/2013, 6:45 AM

## 2013-01-15 NOTE — Anesthesia Procedure Notes (Signed)
Epidural Patient location during procedure: OB  Preanesthetic Checklist Completed: patient identified, site marked, surgical consent, pre-op evaluation, timeout performed, IV checked, risks and benefits discussed and monitors and equipment checked  Epidural Patient position: sitting Prep: site prepped and draped and DuraPrep Patient monitoring: continuous pulse ox and blood pressure Approach: midline Injection technique: LOR air  Needle:  Needle type: Tuohy  Needle gauge: 17 G Needle length: 9 cm and 9 Needle insertion depth: 7 cm Catheter type: closed end flexible Catheter size: 19 Gauge Catheter at skin depth: 15 cm Test dose: negative  Assessment Events: blood not aspirated, injection not painful, no injection resistance, negative IV test and no paresthesia  Additional Notes Dosing of Epidural:  1st dose, through catheter ............................................. epi 1:200K + Xylocaine 40 mg  2nd dose, through catheter, after waiting 3 minutes.....epi 1:200K + Xylocaine 60 mg    ( 2% Xylo charted as a single dose in Epic Meds for ease of charting; actual dosing was fractionated as above, for saftey's sake)  As each dose occurred, patient was free of IV sx; and patient exhibited no evidence of SA injection.  Patient is more comfortable after epidural dosed. Please see RN's note for documentation of vital signs,and FHR which are stable.  Patient reminded not to try to ambulate with numb legs, and that an RN must be present when she attempts to get up.       

## 2013-01-16 ENCOUNTER — Encounter: Payer: BC Managed Care – PPO | Admitting: Obstetrics & Gynecology

## 2013-01-16 DIAGNOSIS — Z029 Encounter for administrative examinations, unspecified: Secondary | ICD-10-CM

## 2013-01-16 LAB — CBC
HCT: 29.2 % — ABNORMAL LOW (ref 36.0–46.0)
Platelets: 161 10*3/uL (ref 150–400)
RDW: 13.3 % (ref 11.5–15.5)
WBC: 13.5 10*3/uL — ABNORMAL HIGH (ref 4.0–10.5)

## 2013-01-16 NOTE — Progress Notes (Signed)
UR chart review completed.  

## 2013-01-16 NOTE — Progress Notes (Signed)
Post Partum Day 1 Subjective: no complaints, up ad lib, voiding and tolerating PO  Objective: Blood pressure 99/65, pulse 76, temperature 98.3 F (36.8 C), temperature source Oral, resp. rate 18, height 5' (1.524 m), weight 103.602 kg (228 lb 6.4 oz), last menstrual period 03/28/2012, SpO2 97.00%, unknown if currently breastfeeding.  Physical Exam:  General: alert, cooperative, appears stated age and no distress Lochia: appropriate Uterine Fundus: firm Incision: NA DVT Evaluation: Negative Homan's sign.   Recent Labs  01/15/13 0215 01/16/13 0615  HGB 12.5 10.1*  HCT 36.0 29.2*    Assessment/Plan: Plan for discharge tomorrow and Contraception NexplanonOP circ. Br/Bo. Encouraged to offer breast first.    LOS: 1 day   Dorathy Kinsman 01/16/2013, 8:01 AM

## 2013-01-16 NOTE — Anesthesia Postprocedure Evaluation (Signed)
  Anesthesia Post-op Note  Patient: Jacqueline Underwood  Procedure(s) Performed: * No procedures listed *  Patient Location: PACU and Mother/Baby  Anesthesia Type:Epidural  Level of Consciousness: awake, alert , oriented and patient cooperative  Airway and Oxygen Therapy: Patient Spontanous Breathing  Post-op Pain: none  Post-op Assessment: Post-op Vital signs reviewed, Patient's Cardiovascular Status Stable and Respiratory Function Stable  Post-op Vital Signs: Reviewed and stable  Complications: No apparent anesthesia complications

## 2013-01-17 MED ORDER — IBUPROFEN 600 MG PO TABS: 600.0000 mg | ORAL_TABLET | Freq: Four times a day (QID) | ORAL | Status: DC | PRN

## 2013-01-17 MED ORDER — MEASLES, MUMPS & RUBELLA VAC ~~LOC~~ INJ
0.5000 mL | INJECTION | Freq: Once | SUBCUTANEOUS | Status: AC
  Administered 2013-01-17: 0.5 mL via SUBCUTANEOUS
  Filled 2013-01-17 (×2): qty 0.5

## 2013-01-17 NOTE — Discharge Summary (Signed)
Obstetric Discharge Summary Reason for Admission: onset of labor Prenatal Procedures: none Intrapartum Procedures: spontaneous vaginal delivery Postpartum Procedures: none Complications-Operative and Postpartum: vaginal laceration Hemoglobin  Date Value Range Status  01/16/2013 10.1* 12.0 - 15.0 g/dL Final     DELTA CHECK NOTED     REPEATED TO VERIFY  05/30/2012 12.1   Final     HCT  Date Value Range Status  01/16/2013 29.2* 36.0 - 46.0 % Final  05/30/2012 35   Final    Physical Exam:  General: alert, cooperative and no distress Lochia: appropriate Uterine Fundus: firm Incision: n/a DVT Evaluation: No evidence of DVT seen on physical exam. Negative Homan's sign. No cords or calf tenderness.  Hospital Course: Jacqueline Underwood is a 22 y.o. G2P1011 at [redacted]w[redacted]d who was admitted to the hospital after presenting for active labor. She had a spontaneous vaginal delivery that was complicated by a vaginal side wall tear, which was repaired with suture. Her postpartum course was unremarkable.  Pt is breast and bottle feeding and is unsure what she will use for contraception (handout given on day of discharge). She will follow up with [facility] in 6 weeks.   Discharge Diagnoses: Term Pregnancy-delivered  Discharge Information: Date: 01/17/2013 Activity: pelvic rest Diet: routine Medications: PNV and Ibuprofen Condition: stable Instructions: refer to practice specific booklet Discharge to: home   Newborn Data: Live born female  Birth Weight: 7 lb 3.5 oz (3274 g) APGAR: 8, 9  Home with mother.  Levert Feinstein 01/17/2013, 9:40 AM  I have seen and examined this patient and I agree with the above. Cam Hai 1:26 AM 01/22/2013

## 2013-01-18 LAB — TYPE AND SCREEN: Unit division: 0

## 2013-01-22 NOTE — H&P (Signed)
Attestation of Attending Supervision of Advanced Practitioner: Evaluation and management procedures were performed by the PA/NP/CNM/OB Fellow under my supervision/collaboration. Chart reviewed and agree with management and plan.  Onica Davidovich V 01/22/2013 10:04 AM

## 2013-03-02 ENCOUNTER — Ambulatory Visit (INDEPENDENT_AMBULATORY_CARE_PROVIDER_SITE_OTHER): Payer: BC Managed Care – PPO | Admitting: Women's Health

## 2013-03-02 ENCOUNTER — Encounter: Payer: Self-pay | Admitting: Women's Health

## 2013-03-02 VITALS — BP 90/60 | Ht 60.0 in | Wt 199.0 lb

## 2013-03-02 DIAGNOSIS — K649 Unspecified hemorrhoids: Secondary | ICD-10-CM

## 2013-03-02 DIAGNOSIS — Z309 Encounter for contraceptive management, unspecified: Secondary | ICD-10-CM

## 2013-03-02 DIAGNOSIS — Z3480 Encounter for supervision of other normal pregnancy, unspecified trimester: Secondary | ICD-10-CM

## 2013-03-02 MED ORDER — MEDROXYPROGESTERONE ACETATE 150 MG/ML IM SUSP: 150.0000 mg | INTRAMUSCULAR | Status: DC

## 2013-03-02 MED ORDER — HYDROCORTISONE 2.5 % RE CREA: TOPICAL_CREAM | Freq: Two times a day (BID) | RECTAL | Status: DC

## 2013-03-02 NOTE — Patient Instructions (Signed)
Constipation  Drink plenty of fluid, preferably water, throughout the day  Eat foods high in fiber such as fruits, vegetables, and grains  Exercise, such as walking, is a good way to keep your bowels regular  Drink warm fluids, especially warm prune juice, or decaf coffee  Eat a 1/2 cup of real oatmeal (not instant), 1/2 cup applesauce, and 1/2-1 cup warm prune juice every day  If needed, you may take Colace (docusate sodium) stool softener once or twice a day to help keep the stool soft.   If you still are having problems with constipation, you may take Miralax once daily as needed to help keep your bowels regular.   If it has been a long time since you have had a bowel movement, and you need to go, you can try a fleets enema  Medroxyprogesterone injection [Contraceptive] Depo What is this medicine? MEDROXYPROGESTERONE (me DROX ee proe JES te rone) contraceptive injections prevent pregnancy. They provide effective birth control for 3 months. Depo-subQ Provera 104 is also used for treating pain related to endometriosis. This medicine may be used for other purposes; ask your health care provider or pharmacist if you have questions. What should I tell my health care provider before I take this medicine? They need to know if you have any of these conditions: -frequently drink alcohol -asthma -blood vessel disease or a history of a blood clot in the lungs or legs -bone disease such as osteoporosis -breast cancer -diabetes -eating disorder (anorexia nervosa or bulimia) -high blood pressure -HIV infection or AIDS -kidney disease -liver disease -mental depression -migraine -seizures (convulsions) -stroke -tobacco smoker -vaginal bleeding -an unusual or allergic reaction to medroxyprogesterone, other hormones, medicines, foods, dyes, or preservatives -pregnant or trying to get pregnant -breast-feeding How should I use this medicine? Depo-Provera Contraceptive injection is given  into a muscle. Depo-subQ Provera 104 injection is given under the skin. These injections are given by a health care professional. You must not be pregnant before getting an injection. The injection is usually given during the first 5 days after the start of a menstrual period or 6 weeks after delivery of a baby. Talk to your pediatrician regarding the use of this medicine in children. Special care may be needed. These injections have been used in female children who have started having menstrual periods. Overdosage: If you think you have taken too much of this medicine contact a poison control center or emergency room at once. NOTE: This medicine is only for you. Do not share this medicine with others. What if I miss a dose? Try not to miss a dose. You must get an injection once every 3 months to maintain birth control. If you cannot keep an appointment, call and reschedule it. If you wait longer than 13 weeks between Depo-Provera contraceptive injections or longer than 14 weeks between Depo-subQ Provera 104 injections, you could get pregnant. Use another method for birth control if you miss your appointment. You may also need a pregnancy test before receiving another injection. What may interact with this medicine? Do not take this medicine with any of the following medications: -bosentan This medicine may also interact with the following medications: -aminoglutethimide -antibiotics or medicines for infections, especially rifampin, rifabutin, rifapentine, and griseofulvin -aprepitant -barbiturate medicines such as phenobarbital or primidone -bexarotene -carbamazepine -medicines for seizures like ethotoin, felbamate, oxcarbazepine, phenytoin, topiramate -modafinil -St. John's wort This list may not describe all possible interactions. Give your health care provider a list of all the medicines, herbs, non-prescription  drugs, or dietary supplements you use. Also tell them if you smoke, drink alcohol,  or use illegal drugs. Some items may interact with your medicine. What should I watch for while using this medicine? This drug does not protect you against HIV infection (AIDS) or other sexually transmitted diseases. Use of this product may cause you to lose calcium from your bones. Loss of calcium may cause weak bones (osteoporosis). Only use this product for more than 2 years if other forms of birth control are not right for you. The longer you use this product for birth control the more likely you will be at risk for weak bones. Ask your health care professional how you can keep strong bones. You may have a change in bleeding pattern or irregular periods. Many females stop having periods while taking this drug. If you have received your injections on time, your chance of being pregnant is very low. If you think you may be pregnant, see your health care professional as soon as possible. Tell your health care professional if you want to get pregnant within the next year. The effect of this medicine may last a long time after you get your last injection. What side effects may I notice from receiving this medicine? Side effects that you should report to your doctor or health care professional as soon as possible: -allergic reactions like skin rash, itching or hives, swelling of the face, lips, or tongue -breast tenderness or discharge -breathing problems -changes in vision -depression -feeling faint or lightheaded, falls -fever -pain in the abdomen, chest, groin, or leg -problems with balance, talking, walking -unusually weak or tired -yellowing of the eyes or skin Side effects that usually do not require medical attention (report to your doctor or health care professional if they continue or are bothersome): -acne -fluid retention and swelling -headache -irregular periods, spotting, or absent periods -temporary pain, itching, or skin reaction at site where injected -weight gain This list  may not describe all possible side effects. Call your doctor for medical advice about side effects. You may report side effects to FDA at 1-800-FDA-1088. Where should I keep my medicine? This does not apply. The injection will be given to you by a health care professional. NOTE: This sheet is a summary. It may not cover all possible information. If you have questions about this medicine, talk to your doctor, pharmacist, or health care provider.  2013, [employer] Standard. (06/04/2008 6:37:56 PM)

## 2013-03-02 NOTE — Progress Notes (Signed)
Patient ID: Jacqueline Underwood, female   DOB: 28-May-1991, 22 y.o.   MRN: [id number] Subjective:    Jacqueline Underwood is a 22 y.o. G77P1011 African American female who presents for a postpartum visit. She is 6 weeks postpartum following a spontaneous vaginal delivery. I have fully reviewed the prenatal and intrapartum course. The delivery was at 39.4 gestational weeks. Outcome: spontaneous vaginal delivery. Anesthesia: epidural. Postpartum course has been complicated by constipation. Baby's course has been uncomplicated. Baby is feeding by breast and bottle. Bleeding no bleeding. Bowel function is abnormal. Constipation, bleeding w/ bm's. Bladder function is normal. Patient is sexually active, had sex for first time on 10/4, used condom. Contraception method is condoms. Discussed contraception options w/ BF, r/b of all, pt decided she would like to do depo. Postpartum depression screening: negative.  The following portions of the patient's history were reviewed and updated as appropriate: allergies, current medications, past medical history, past surgical history and problem list.  Review of Systems Pertinent items are noted in HPI.   Filed Vitals:   03/02/13 0921  BP: 90/60  Height: 5' (1.524 m)  Weight: 199 lb (90.266 kg)    Objective:     General:  alert, cooperative and no distress   Breasts:  deferred, no complaints  Lungs: clear to auscultation bilaterally  Heart:  regular rate and rhythm  Abdomen: soft, nontender   Vulva: normal  Vagina: normal vagina  Cervix:  closed  Corpus: Well-involuted  Adnexa:  Non-palpable  Rectal Exam: No external hemorrhoids, some internal hemorrhoids         Assessment:   Normal postpartum exam 6 wks s/p SVD Constipation w/ hemorrhoids Depression screening Contraception counseling   Plan:   Contraception: desires depo To refrain from sex until after depo Follow up in: 10/14 am for bHCG, pm for depo injection Rx Anusol HC for hemorrhoids Rx Depo  provera w/ 4RF Reviewed constipation prevention/relief measures, and printed info given  Cheral Marker, CNM, WHNP-BC 03/02/2013 10:04 AM

## 2013-03-10 ENCOUNTER — Other Ambulatory Visit: Payer: BC Managed Care – PPO

## 2013-03-10 ENCOUNTER — Ambulatory Visit: Payer: BC Managed Care – PPO

## 2013-04-02 ENCOUNTER — Other Ambulatory Visit: Payer: Self-pay

## 2013-10-28 ENCOUNTER — Encounter (HOSPITAL_COMMUNITY): Payer: Self-pay | Admitting: Emergency Medicine

## 2013-10-28 ENCOUNTER — Emergency Department (HOSPITAL_COMMUNITY)
Admission: EM | Admit: 2013-10-28 | Discharge: 2013-10-28 | Disposition: A | Discharge status: Home / Self Care | Payer: Medicaid Other | Attending: Emergency Medicine | Admitting: Emergency Medicine

## 2013-10-28 DIAGNOSIS — Z8619 Personal history of other infectious and parasitic diseases: Secondary | ICD-10-CM | POA: Insufficient documentation

## 2013-10-28 DIAGNOSIS — IMO0002 Reserved for concepts with insufficient information to code with codable children: Secondary | ICD-10-CM | POA: Insufficient documentation

## 2013-10-28 DIAGNOSIS — Z87440 Personal history of urinary (tract) infections: Secondary | ICD-10-CM | POA: Insufficient documentation

## 2013-10-28 DIAGNOSIS — L293 Anogenital pruritus, unspecified: Secondary | ICD-10-CM | POA: Insufficient documentation

## 2013-10-28 DIAGNOSIS — Z3202 Encounter for pregnancy test, result negative: Secondary | ICD-10-CM | POA: Insufficient documentation

## 2013-10-28 DIAGNOSIS — N898 Other specified noninflammatory disorders of vagina: Secondary | ICD-10-CM | POA: Insufficient documentation

## 2013-10-28 DIAGNOSIS — Z79899 Other long term (current) drug therapy: Secondary | ICD-10-CM | POA: Insufficient documentation

## 2013-10-28 LAB — URINALYSIS, ROUTINE W REFLEX MICROSCOPIC
BILIRUBIN URINE: NEGATIVE
GLUCOSE, UA: NEGATIVE mg/dL
KETONES UR: NEGATIVE mg/dL
Leukocytes, UA: NEGATIVE
Nitrite: NEGATIVE
PH: 6 (ref 5.0–8.0)
PROTEIN: NEGATIVE mg/dL
Specific Gravity, Urine: >1.03 — ABNORMAL HIGH (ref 1.005–1.030)
Urobilinogen, UA: 0.2 mg/dL (ref 0.0–1.0)

## 2013-10-28 LAB — WET PREP, GENITAL
CLUE CELLS WET PREP: NONE SEEN
TRICH WET PREP: NONE SEEN
WBC WET PREP: NONE SEEN
Yeast Wet Prep HPF POC: NONE SEEN

## 2013-10-28 LAB — PREGNANCY, URINE: Preg Test, Ur: NEGATIVE

## 2013-10-28 LAB — URINE MICROSCOPIC-ADD ON

## 2013-10-28 NOTE — ED Provider Notes (Signed)
CSN: [id number]     Arrival date & time 10/28/13  0106 History   First MD Initiated Contact with Patient 10/28/13 0110     Chief Complaint  Patient presents with  . Vaginal Itching     (Consider location/radiation/quality/duration/timing/severity/associated sxs/prior Treatment) HPI Comments: 23 year old female who presents with a complaint of vaginal itching. She states that in the month of May she had 2 abnormal periods and a set of one, she is usually very regular, the initial menstrual cycle and bright red blood, the second one which has been ongoing for the last several days has more of a brownish discharge from the vagina. She had a tampon in this evening which he took out, she realized that it was not saturated or "ready to be taken out" and states that after she took it out she began to have significant itching in her vaginal area. She denies dysuria, she denies being pregnant, states that she is sexually active but has used regular condoms and never misses that. She does not use any other forms of birth control. She denies abdominal pain, back pain, fevers chills nausea or vomiting.  Patient is a 23 y.o. female presenting with vaginal itching. The history is provided by the patient.  Vaginal Itching    Past Medical History  Diagnosis Date  . Pregnant   . BV (bacterial vaginosis)   . Yeast infection   . UTI (urinary tract infection)   . Uterine fibroid in pregnancy    Past Surgical History  Procedure Laterality Date  . No past surgeries     Family History  Problem Relation Age of Onset  . Hypertension Father   . Diabetes Maternal Grandmother    History  Substance Use Topics  . Smoking status: Never Smoker   . Smokeless tobacco: Never Used  . Alcohol Use: No   OB History   Grav Para Term Preterm Abortions TAB SAB Ect Mult Living   2 1 1  1  1   1      Review of Systems  All other systems reviewed and are negative.     Allergies  Peanuts  Home Medications    Prior to Admission medications   Medication Sig Start Date End Date Taking? Authorizing Provider  hydrocortisone (ANUSOL-HC) 2.5 % rectal cream Place rectally 2 (two) times daily. 03/02/13   Tawnya Crook, CNM  ibuprofen (ADVIL,MOTRIN) 600 MG tablet Take 1 tablet (600 mg total) by mouth every 6 (six) hours as needed for pain. 01/17/13   Leeanne Rio, MD  medroxyPROGESTERone (DEPO-PROVERA) 150 MG/ML injection Inject 1 mL (150 mg total) into the muscle every 3 (three) months. 03/02/13   Tawnya Crook, CNM  Prenatal Vit-Fe Fumarate-FA (PRENATAL MULTIVITAMIN) TABS Take 1 tablet by mouth daily.    Historical Provider, MD   BP 110/63  Pulse 63  Temp(Src) 97.7 F (36.5 C) (Oral)  Resp 18  Ht 5' (1.524 m)  Wt 200 lb (90.719 kg)  BMI 39.06 kg/m2  SpO2 100%  LMP 10/23/2013 Physical Exam  Nursing note and vitals reviewed. Constitutional: She appears well-developed and well-nourished. No distress.  HENT:  Head: Normocephalic and atraumatic.  Mouth/Throat: Oropharynx is clear and moist. No oropharyngeal exudate.  Eyes: Conjunctivae and EOM are normal. Pupils are equal, round, and reactive to light. Right eye exhibits no discharge. Left eye exhibits no discharge. No scleral icterus.  Neck: Normal range of motion. Neck supple. No JVD present. No thyromegaly present.  Cardiovascular: Normal rate, regular rhythm,  normal heart sounds and intact distal pulses.  Exam reveals no gallop and no friction rub.   No murmur heard. Pulmonary/Chest: Effort normal and breath sounds normal. No respiratory distress. She has no wheezes. She has no rales.  Abdominal: Soft. Bowel sounds are normal. She exhibits no distension and no mass. There is no tenderness.  Genitourinary:  Chaperone present for exam, normal appearing external genitalia, internal exam with small amount of brown thick discharge, no cervical motion tenderness or adnexal tenderness or masses, no foreign bodies   Musculoskeletal: Normal range of motion. She exhibits no edema and no tenderness.  Lymphadenopathy:    She has no cervical adenopathy.  Neurological: She is alert. Coordination normal.  Skin: Skin is warm and dry. No rash noted. No erythema.  Psychiatric: She has a normal mood and affect. Her behavior is normal.    ED Course  Procedures (including critical care time) Labs Review Labs Reviewed  URINALYSIS, ROUTINE W REFLEX MICROSCOPIC - Abnormal; Notable for the following:    Specific Gravity, Urine >1.030 (*)    Hgb urine dipstick MODERATE (*)    All other components within normal limits  URINE MICROSCOPIC-ADD ON - Abnormal; Notable for the following:    Squamous Epithelial / LPF MANY (*)    Bacteria, UA MANY (*)    All other components within normal limits  WET PREP, GENITAL  GC/CHLAMYDIA PROBE AMP  PREGNANCY, URINE    Imaging Review No results found.    MDM   Final diagnoses:  Vaginal itching  Vaginal discharge    The patient is a very well-appearing normal vital signs, normal wet prep normal urinalysis except for some bacteriuria but significant contamination. There was a high specific gravity, no nitrates, no leukocytes. Not pregnant, patient informed of results, encouraged to use hypoallergenic soap, stay away from tampons for a couple of days and follow up with gynecology as needed.  Informed of STD results lacking behind others, patient expresses understanding      Johnna Acosta, MD 10/28/13 [phone]

## 2013-10-28 NOTE — Discharge Instructions (Signed)
Your initial labs show no signs of infection - the gonorrhea and chlamydia testing takes a couple of days to result - if they are positive you will receive a phone call - you are NOT pregnant, you do NOT have yeast infection or bacterial infection - the dark colored d/c may be related to old blood.  This should improve over a couple of days - change from tampon to undergarment pads for a couple of days, wash with hypoallergenic soap, return if symptoms worsen.

## 2013-10-28 NOTE — ED Notes (Signed)
Patient states she had 2 periods in May.  Patient states took out a tampon tonight and since then has had vaginal irritation and itching.

## 2013-10-29 LAB — GC/CHLAMYDIA PROBE AMP
CT PROBE, AMP APTIMA: NEGATIVE
GC PROBE AMP APTIMA: NEGATIVE

## 2013-12-28 ENCOUNTER — Emergency Department (HOSPITAL_COMMUNITY)
Admission: EM | Admit: 2013-12-28 | Discharge: 2013-12-28 | Disposition: A | Discharge status: Home / Self Care | Payer: BC Managed Care – PPO

## 2014-03-29 ENCOUNTER — Encounter (HOSPITAL_COMMUNITY): Payer: Self-pay | Admitting: Emergency Medicine

## 2014-03-31 ENCOUNTER — Encounter (HOSPITAL_COMMUNITY): Payer: Self-pay | Admitting: Emergency Medicine

## 2014-03-31 ENCOUNTER — Emergency Department (HOSPITAL_COMMUNITY)
Admission: EM | Admit: 2014-03-31 | Discharge: 2014-03-31 | Disposition: A | Discharge status: Home / Self Care | Payer: Medicaid Other | Attending: Emergency Medicine | Admitting: Emergency Medicine

## 2014-03-31 ENCOUNTER — Emergency Department (HOSPITAL_COMMUNITY): Payer: Medicaid Other

## 2014-03-31 DIAGNOSIS — Y9339 Activity, other involving climbing, rappelling and jumping off: Secondary | ICD-10-CM | POA: Diagnosis not present

## 2014-03-31 DIAGNOSIS — Z7952 Long term (current) use of systemic steroids: Secondary | ICD-10-CM | POA: Insufficient documentation

## 2014-03-31 DIAGNOSIS — Z791 Long term (current) use of non-steroidal anti-inflammatories (NSAID): Secondary | ICD-10-CM | POA: Insufficient documentation

## 2014-03-31 DIAGNOSIS — Z87440 Personal history of urinary (tract) infections: Secondary | ICD-10-CM | POA: Insufficient documentation

## 2014-03-31 DIAGNOSIS — Z8619 Personal history of other infectious and parasitic diseases: Secondary | ICD-10-CM | POA: Insufficient documentation

## 2014-03-31 DIAGNOSIS — W11XXXA Fall on and from ladder, initial encounter: Secondary | ICD-10-CM | POA: Insufficient documentation

## 2014-03-31 DIAGNOSIS — Z8742 Personal history of other diseases of the female genital tract: Secondary | ICD-10-CM | POA: Diagnosis not present

## 2014-03-31 DIAGNOSIS — S93401A Sprain of unspecified ligament of right ankle, initial encounter: Secondary | ICD-10-CM

## 2014-03-31 DIAGNOSIS — Z79899 Other long term (current) drug therapy: Secondary | ICD-10-CM | POA: Diagnosis not present

## 2014-03-31 DIAGNOSIS — Y9289 Other specified places as the place of occurrence of the external cause: Secondary | ICD-10-CM | POA: Diagnosis not present

## 2014-03-31 DIAGNOSIS — S99911A Unspecified injury of right ankle, initial encounter: Secondary | ICD-10-CM | POA: Diagnosis present

## 2014-03-31 DIAGNOSIS — R52 Pain, unspecified: Secondary | ICD-10-CM

## 2014-03-31 NOTE — ED Notes (Signed)
Pt reports right ankle pain after falling off of a ladder at work. Pt denies hitting head or loc. No obvious deformity noted. Pt reports is able to bear weight but with pain.

## 2014-03-31 NOTE — ED Provider Notes (Signed)
CSN: [id number]     Arrival date & time 03/31/14  8 History   First MD Initiated Contact with Patient 03/31/14 1320     Chief Complaint  Patient presents with  . Ankle Pain     (Consider location/radiation/quality/duration/timing/severity/associated sxs/prior Treatment) Patient is a 23 y.o. female presenting with ankle pain. The history is provided by the patient.  Ankle Pain Associated symptoms: no back pain   patient states that she was on a ladder at work when it tipped over and she jumped off, landing awkwardly on her right ankle. She has had pain in her right ankle. No other injury.  Past Medical History  Diagnosis Date  . Pregnant   . BV (bacterial vaginosis)   . Yeast infection   . UTI (urinary tract infection)   . Uterine fibroid in pregnancy    Past Surgical History  Procedure Laterality Date  . No past surgeries     Family History  Problem Relation Age of Onset  . Hypertension Father   . Diabetes Maternal Grandmother    History  Substance Use Topics  . Smoking status: Never Smoker   . Smokeless tobacco: Never Used  . Alcohol Use: No   OB History    Gravida Para Term Preterm AB TAB SAB Ectopic Multiple Living   2 1 1  1  1   1      Review of Systems  Musculoskeletal: Negative for back pain and joint swelling.  Skin: Negative for wound.  Neurological: Negative for weakness and numbness.      Allergies  Peanuts  Home Medications   Prior to Admission medications   Medication Sig Start Date End Date Taking? Authorizing Provider  hydrocortisone (ANUSOL-HC) 2.5 % rectal cream Place rectally 2 (two) times daily. 03/02/13   Tawnya Crook, CNM  ibuprofen (ADVIL,MOTRIN) 600 MG tablet Take 1 tablet (600 mg total) by mouth every 6 (six) hours as needed for pain. 01/17/13   Leeanne Rio, MD  medroxyPROGESTERone (DEPO-PROVERA) 150 MG/ML injection Inject 1 mL (150 mg total) into the muscle every 3 (three) months. 03/02/13   Tawnya Crook, CNM  Prenatal Vit-Fe Fumarate-FA (PRENATAL MULTIVITAMIN) TABS Take 1 tablet by mouth daily.    Historical Provider, MD   BP 105/62 mmHg  Pulse 91  Temp(Src) 98.2 F (36.8 C) (Oral)  Resp 18  Ht 5' (1.524 m)  Wt 200 lb (90.719 kg)  BMI 39.06 kg/m2  SpO2 98%  LMP 03/29/2014  Breastfeeding? No Physical Exam  Constitutional: She appears well-developed and well-nourished.  Musculoskeletal:  Mild tenderness to distal fibula on the right.no swelling range of motion intact no tenderness over her foot  Skin: No erythema.    ED Course  Procedures (including critical care time) Labs Review Labs Reviewed - No data to display  Imaging Review Dg Ankle Complete Right  03/31/2014   CLINICAL DATA:  Ankle injury today  EXAM: RIGHT ANKLE - COMPLETE 3+ VIEW  COMPARISON:  None.  FINDINGS: The ankle joint mortise is preserved. The talar dome is intact. There is no acute malleolar fracture. The other visualized bony structures are unremarkable. There is mild diffuse soft tissue swelling.  IMPRESSION: There is no acute fracture nor dislocation of the right ankle.   Electronically Signed   By: David  Martinique   On: 03/31/2014 13:18     EKG Interpretation None      MDM   Final diagnoses:  Pain  Ankle sprain, right, initial encounter  Likely ankle sprain, negative x-ray. No tenderness over head of fibula discharged home with ASO    Jasper Riling. Alvino Chapel, MD 03/31/14 1346

## 2014-03-31 NOTE — Discharge Instructions (Signed)

## 2014-06-11 ENCOUNTER — Other Ambulatory Visit: Payer: Self-pay | Admitting: Obstetrics & Gynecology

## 2014-06-11 DIAGNOSIS — O3680X0 Pregnancy with inconclusive fetal viability, not applicable or unspecified: Secondary | ICD-10-CM

## 2014-06-15 ENCOUNTER — Encounter: Payer: Self-pay | Admitting: Obstetrics & Gynecology

## 2014-06-15 ENCOUNTER — Ambulatory Visit (INDEPENDENT_AMBULATORY_CARE_PROVIDER_SITE_OTHER): Payer: Medicaid Other

## 2014-06-15 ENCOUNTER — Other Ambulatory Visit: Payer: Self-pay | Admitting: Obstetrics & Gynecology

## 2014-06-15 DIAGNOSIS — O30041 Twin pregnancy, dichorionic/diamniotic, first trimester: Secondary | ICD-10-CM

## 2014-06-15 DIAGNOSIS — O26841 Uterine size-date discrepancy, first trimester: Secondary | ICD-10-CM

## 2014-06-15 DIAGNOSIS — D259 Leiomyoma of uterus, unspecified: Secondary | ICD-10-CM

## 2014-06-15 DIAGNOSIS — O3411 Maternal care for benign tumor of corpus uteri, first trimester: Secondary | ICD-10-CM

## 2014-06-15 DIAGNOSIS — O3680X0 Pregnancy with inconclusive fetal viability, not applicable or unspecified: Secondary | ICD-10-CM

## 2014-06-15 NOTE — Progress Notes (Signed)
U/S-TWINS! Diamniotic/Dichorionic, 2 GS noted,2 fetal poles noted, 2 YS noted, CRL of A & B c/w 8+2 wks EDD 01/23/2015, cx appears closed, bilateral adnexa appears WNL, uterine pedunculated fibroid noted= 9.3 x 6.6 x 6.5cm, FHR (A) & (B) 168 bpm

## 2014-06-18 LAB — US OB COMP ADDL GEST LESS 14 WKS

## 2014-06-23 ENCOUNTER — Encounter: Payer: Self-pay | Admitting: Advanced Practice Midwife

## 2014-06-23 ENCOUNTER — Ambulatory Visit (INDEPENDENT_AMBULATORY_CARE_PROVIDER_SITE_OTHER): Payer: Medicaid Other | Admitting: Advanced Practice Midwife

## 2014-06-23 ENCOUNTER — Other Ambulatory Visit (HOSPITAL_COMMUNITY)
Admission: RE | Admit: 2014-06-23 | Discharge: 2014-06-23 | Disposition: A | Discharge status: Home / Self Care | Payer: Medicaid Other | Source: Ambulatory Visit | Attending: Advanced Practice Midwife | Admitting: Advanced Practice Midwife

## 2014-06-23 VITALS — BP 108/60 | Wt 207.0 lb

## 2014-06-23 DIAGNOSIS — O30049 Twin pregnancy, dichorionic/diamniotic, unspecified trimester: Secondary | ICD-10-CM | POA: Insufficient documentation

## 2014-06-23 DIAGNOSIS — Z331 Pregnant state, incidental: Secondary | ICD-10-CM | POA: Diagnosis not present

## 2014-06-23 DIAGNOSIS — Z01419 Encounter for gynecological examination (general) (routine) without abnormal findings: Secondary | ICD-10-CM | POA: Insufficient documentation

## 2014-06-23 DIAGNOSIS — O30041 Twin pregnancy, dichorionic/diamniotic, first trimester: Secondary | ICD-10-CM | POA: Diagnosis not present

## 2014-06-23 DIAGNOSIS — Z1371 Encounter for nonprocreative screening for genetic disease carrier status: Secondary | ICD-10-CM

## 2014-06-23 DIAGNOSIS — O09891 Supervision of other high risk pregnancies, first trimester: Secondary | ICD-10-CM | POA: Diagnosis not present

## 2014-06-23 DIAGNOSIS — Z113 Encounter for screening for infections with a predominantly sexual mode of transmission: Secondary | ICD-10-CM | POA: Insufficient documentation

## 2014-06-23 DIAGNOSIS — Z1389 Encounter for screening for other disorder: Secondary | ICD-10-CM

## 2014-06-23 DIAGNOSIS — Z3481 Encounter for supervision of other normal pregnancy, first trimester: Secondary | ICD-10-CM

## 2014-06-23 DIAGNOSIS — Z0184 Encounter for antibody response examination: Secondary | ICD-10-CM

## 2014-06-23 DIAGNOSIS — Z130 Encounter for screening for diseases of the blood and blood-forming organs and certain disorders involving the immune mechanism: Secondary | ICD-10-CM

## 2014-06-23 DIAGNOSIS — Z0283 Encounter for blood-alcohol and blood-drug test: Secondary | ICD-10-CM

## 2014-06-23 DIAGNOSIS — Z124 Encounter for screening for malignant neoplasm of cervix: Secondary | ICD-10-CM

## 2014-06-23 DIAGNOSIS — Z114 Encounter for screening for human immunodeficiency virus [HIV]: Secondary | ICD-10-CM

## 2014-06-23 DIAGNOSIS — O09899 Supervision of other high risk pregnancies, unspecified trimester: Secondary | ICD-10-CM | POA: Insufficient documentation

## 2014-06-23 LAB — POCT URINALYSIS DIPSTICK
Glucose, UA: NEGATIVE
KETONES UA: NEGATIVE
Leukocytes, UA: NEGATIVE
Nitrite, UA: NEGATIVE
RBC UA: NEGATIVE

## 2014-06-23 LAB — OB RESULTS CONSOLE RUBELLA ANTIBODY, IGM: Rubella: IMMUNE

## 2014-06-23 MED ORDER — DOXYLAMINE-PYRIDOXINE 10-10 MG PO TBEC: DELAYED_RELEASE_TABLET | ORAL | Status: DC

## 2014-06-23 NOTE — Progress Notes (Signed)
  Subjective:    Jacqueline Underwood is a G106P1011 [redacted]w[redacted]d being seen today for her first obstetrical visit.  Her obstetrical history is significant for nothing.  Pregnancy history fully reviewed.  Patient reports nausea and vomiting.  Filed Vitals:   06/23/14 0857  BP: 108/60  Weight: 207 lb (93.895 kg)    HISTORY: OB History  Gravida Para Term Preterm AB SAB TAB Ectopic Multiple Living  3 1 1  1 1    1     # Outcome Date GA Lbr Len/2nd Weight Sex Delivery Anes PTL Lv  3 Current           2 Term 01/15/13 [redacted]w[redacted]d 18:53 / 01:11 7 lb 3.5 oz (3.274 kg) M Vag-Spont EPI  Y     Comments: within normal limits  1 SAB 2013             Past Medical History  Diagnosis Date  . Pregnant   . BV (bacterial vaginosis)   . Yeast infection   . UTI (urinary tract infection)   . Uterine fibroid in pregnancy    Past Surgical History  Procedure Laterality Date  . No past surgeries     Family History  Problem Relation Age of Onset  . Hypertension Father   . Diabetes Maternal Grandmother      Exam       Pelvic Exam:    Perineum: Normal Perineum   Vulva: normal   Vagina:  normal mucosa, normal discharge, no palpable nodules   Uterus Normal, Gravid,     Cervix: Normal;  Pap collected   Adnexa: Not palpable   Urinary:  urethral meatus normal    System: Breast:  normal appearance, no masses or tenderness   Skin: normal coloration and turgor, no rashes    Neurologic: oriented, normal, normal mood   Extremities: normal strength, tone, and muscle mass   HEENT PERRLA   Mouth/Teeth mucous membranes moist, normal dentition   Neck supple and no masses   Cardiovascular: regular rate and rhythm   Respiratory:  appears well, vitals normal, no respiratory distress, acyanotic   Abdomen: soft, non-tender;  FHR 150/150, active fetus via [place]          Assessment:    Pregnancy: G3P1011 Patient Active Problem List   Diagnosis Date Noted  . Supervision of other high-risk pregnancy 06/23/2014  .  Dichorionic diamniotic twin gestation 06/23/2014  . Uterine fibroid in pregnancy 10/21/2012        Plan:     Initial labs drawn. Continue prenatal vitamins  Problem list reviewed and updated  Reviewed n/v relief measures and warning s/s to report  Diclegis samples, rx and pre auth Reviewed recommended weight gain based on pre-gravid BMI  Encouraged well-balanced diet Genetic Screening discussed Integrated Screen: requested.  Ultrasound discussed; fetal survey: requested.  Follow up in 3 weeks for NT/IT/ob visit.  CRESENZO-DISHMAN,Laurajean Hosek 06/23/2014

## 2014-06-24 LAB — CYTOLOGY - PAP

## 2014-06-25 LAB — CBC
HCT: 34.9 % (ref 34.0–46.6)
Hemoglobin: 12 g/dL (ref 11.1–15.9)
MCH: 30.5 pg (ref 26.6–33.0)
MCHC: 34.4 g/dL (ref 31.5–35.7)
MCV: 89 fL (ref 79–97)
PLATELETS: 304 10*3/uL (ref 150–379)
RBC: 3.93 x10E6/uL (ref 3.77–5.28)
RDW: 13.4 % (ref 12.3–15.4)
WBC: 8.2 10*3/uL (ref 3.4–10.8)

## 2014-06-25 LAB — SICKLE CELL SCREEN: SICKLE CELL PREP: NEGATIVE

## 2014-06-25 LAB — HEPATITIS B SURFACE ANTIGEN: Hepatitis B Surface Ag: NEGATIVE

## 2014-06-25 LAB — PMP SCREEN PROFILE (10S), URINE
AMPHETAMINE SCRN UR: NEGATIVE ng/mL
BARBITURATE SCRN UR: NEGATIVE ng/mL
Benzodiazepine Screen, Urine: NEGATIVE ng/mL
CANNABINOIDS UR QL SCN: NEGATIVE ng/mL
Cocaine(Metab.)Screen, Urine: NEGATIVE ng/mL
Creatinine(Crt), U: 244.3 mg/dL (ref 20.0–300.0)
Methadone Scn, Ur: NEGATIVE ng/mL
OXYCODONE+OXYMORPHONE UR QL SCN: NEGATIVE ng/mL
Opiate Scrn, Ur: NEGATIVE ng/mL
PCP SCRN UR: NEGATIVE ng/mL
Ph of Urine: 6.6 (ref 4.5–8.9)
Propoxyphene, Screen: NEGATIVE ng/mL

## 2014-06-25 LAB — HIV ANTIBODY (ROUTINE TESTING W REFLEX): HIV SCREEN 4TH GENERATION: NONREACTIVE

## 2014-06-25 LAB — VARICELLA ZOSTER ANTIBODY, IGG: Varicella zoster IgG: 2308 index (ref >165)

## 2014-06-25 LAB — URINALYSIS, ROUTINE W REFLEX MICROSCOPIC
Bilirubin, UA: NEGATIVE
Glucose, UA: NEGATIVE
Ketones, UA: NEGATIVE
LEUKOCYTES UA: NEGATIVE
NITRITE UA: NEGATIVE
RBC UA: NEGATIVE
Urobilinogen, Ur: 1 mg/dL (ref 0.2–1.0)
pH, UA: 7 (ref 5.0–7.5)

## 2014-06-25 LAB — RPR: RPR: NONREACTIVE

## 2014-06-25 LAB — ANTIBODY SCREEN: ANTIBODY SCREEN: NEGATIVE

## 2014-06-25 LAB — RUBELLA SCREEN: RUBELLA: 2.89 {index} (ref >0.99)

## 2014-07-13 ENCOUNTER — Other Ambulatory Visit: Payer: Self-pay | Admitting: Obstetrics and Gynecology

## 2014-07-13 ENCOUNTER — Other Ambulatory Visit: Payer: Self-pay | Admitting: Advanced Practice Midwife

## 2014-07-13 DIAGNOSIS — Z3682 Encounter for antenatal screening for nuchal translucency: Secondary | ICD-10-CM

## 2014-07-14 ENCOUNTER — Other Ambulatory Visit: Payer: Self-pay | Admitting: Advanced Practice Midwife

## 2014-07-14 ENCOUNTER — Ambulatory Visit (INDEPENDENT_AMBULATORY_CARE_PROVIDER_SITE_OTHER): Payer: Medicaid Other

## 2014-07-14 ENCOUNTER — Encounter: Payer: Self-pay | Admitting: Women's Health

## 2014-07-14 ENCOUNTER — Ambulatory Visit (INDEPENDENT_AMBULATORY_CARE_PROVIDER_SITE_OTHER): Payer: Medicaid Other | Admitting: Women's Health

## 2014-07-14 VITALS — BP 102/60 | Wt 208.0 lb

## 2014-07-14 DIAGNOSIS — O3410 Maternal care for benign tumor of corpus uteri, unspecified trimester: Secondary | ICD-10-CM

## 2014-07-14 DIAGNOSIS — A499 Bacterial infection, unspecified: Secondary | ICD-10-CM

## 2014-07-14 DIAGNOSIS — O09891 Supervision of other high risk pregnancies, first trimester: Secondary | ICD-10-CM

## 2014-07-14 DIAGNOSIS — N898 Other specified noninflammatory disorders of vagina: Secondary | ICD-10-CM

## 2014-07-14 DIAGNOSIS — Z36 Encounter for antenatal screening of mother: Secondary | ICD-10-CM

## 2014-07-14 DIAGNOSIS — D259 Leiomyoma of uterus, unspecified: Secondary | ICD-10-CM

## 2014-07-14 DIAGNOSIS — Z3682 Encounter for antenatal screening for nuchal translucency: Secondary | ICD-10-CM

## 2014-07-14 DIAGNOSIS — Z331 Pregnant state, incidental: Secondary | ICD-10-CM

## 2014-07-14 DIAGNOSIS — O30049 Twin pregnancy, dichorionic/diamniotic, unspecified trimester: Secondary | ICD-10-CM

## 2014-07-14 DIAGNOSIS — Z1389 Encounter for screening for other disorder: Secondary | ICD-10-CM

## 2014-07-14 DIAGNOSIS — O30041 Twin pregnancy, dichorionic/diamniotic, first trimester: Secondary | ICD-10-CM

## 2014-07-14 DIAGNOSIS — Z3481 Encounter for supervision of other normal pregnancy, first trimester: Secondary | ICD-10-CM

## 2014-07-14 DIAGNOSIS — N760 Acute vaginitis: Secondary | ICD-10-CM

## 2014-07-14 DIAGNOSIS — B9689 Other specified bacterial agents as the cause of diseases classified elsewhere: Secondary | ICD-10-CM

## 2014-07-14 LAB — POCT WET PREP (WET MOUNT): Clue Cells Wet Prep Whiff POC: POSITIVE

## 2014-07-14 LAB — POCT URINALYSIS DIPSTICK
Blood, UA: NEGATIVE
Glucose, UA: NEGATIVE
LEUKOCYTES UA: NEGATIVE
NITRITE UA: NEGATIVE
PROTEIN UA: NEGATIVE

## 2014-07-14 MED ORDER — METRONIDAZOLE 500 MG PO TABS: 500.0000 mg | ORAL_TABLET | Freq: Two times a day (BID) | ORAL | Status: DC

## 2014-07-14 NOTE — Patient Instructions (Signed)
First Trimester of Pregnancy The first trimester of pregnancy is from week 1 until the end of week 12 (months 1 through 3). A week after a sperm fertilizes an egg, the egg will implant on the wall of the uterus. This embryo will begin to develop into a baby. Genes from you and your partner are forming the baby. The female genes determine whether the baby is a boy or a girl. At 6-8 weeks, the eyes and face are formed, and the heartbeat can be seen on ultrasound. At the end of 12 weeks, all the baby's organs are formed.  Now that you are pregnant, you will want to do everything you can to have a healthy baby. Two of the most important things are to get good prenatal care and to follow your health care provider's instructions. Prenatal care is all the medical care you receive before the baby's birth. This care will help prevent, find, and treat any problems during the pregnancy and childbirth. BODY CHANGES Your body goes through many changes during pregnancy. The changes vary from woman to woman.   You may gain or lose a couple of pounds at first.  You may feel sick to your stomach (nauseous) and throw up (vomit). If the vomiting is uncontrollable, call your health care provider.  You may tire easily.  You may develop headaches that can be relieved by medicines approved by your health care provider.  You may urinate more often. Painful urination may mean you have a bladder infection.  You may develop heartburn as a result of your pregnancy.  You may develop constipation because certain hormones are causing the muscles that push waste through your intestines to slow down.  You may develop hemorrhoids or swollen, bulging veins (varicose veins).  Your breasts may begin to grow larger and become tender. Your nipples may stick out more, and the tissue that surrounds them (areola) may become darker.  Your gums may bleed and may be sensitive to brushing and flossing.  Dark spots or blotches (chloasma,  mask of pregnancy) may develop on your face. This will likely fade after the baby is born.  Your menstrual periods will stop.  You may have a loss of appetite.  You may develop cravings for certain kinds of food.  You may have changes in your emotions from day to day, such as being excited to be pregnant or being concerned that something may go wrong with the pregnancy and baby.  You may have more vivid and strange dreams.  You may have changes in your hair. These can include thickening of your hair, rapid growth, and changes in texture. Some women also have hair loss during or after pregnancy, or hair that feels dry or thin. Your hair will most likely return to normal after your baby is born. WHAT TO EXPECT AT YOUR PRENATAL VISITS During a routine prenatal visit:  You will be weighed to make sure you and the baby are growing normally.  Your blood pressure will be taken.  Your abdomen will be measured to track your baby's growth.  The fetal heartbeat will be listened to starting around week 10 or 12 of your pregnancy.  Test results from any previous visits will be discussed. Your health care provider may ask you:  How you are feeling.  If you are feeling the baby move.  If you have had any abnormal symptoms, such as leaking fluid, bleeding, severe headaches, or abdominal cramping.  If you have any questions. Other tests   that may be performed during your first trimester include:  Blood tests to find your blood type and to check for the presence of any previous infections. They will also be used to check for low iron levels (anemia) and Rh antibodies. Later in the pregnancy, blood tests for diabetes will be done along with other tests if problems develop.  Urine tests to check for infections, diabetes, or protein in the urine.  An ultrasound to confirm the proper growth and development of the baby.  An amniocentesis to check for possible genetic problems.  Fetal screens for  spina bifida and Down syndrome.  You may need other tests to make sure you and the baby are doing well. HOME CARE INSTRUCTIONS  Medicines  Follow your health care provider's instructions regarding medicine use. Specific medicines may be either safe or unsafe to take during pregnancy.  Take your prenatal vitamins as directed.  If you develop constipation, try taking a stool softener if your health care provider approves. Diet  Eat regular, well-balanced meals. Choose a variety of foods, such as meat or vegetable-based protein, fish, milk and low-fat dairy products, vegetables, fruits, and whole grain breads and cereals. Your health care provider will help you determine the amount of weight gain that is right for you.  Avoid raw meat and uncooked cheese. These carry germs that can cause birth defects in the baby.  Eating four or five small meals rather than three large meals a day may help relieve nausea and vomiting. If you start to feel nauseous, eating a few soda crackers can be helpful. Drinking liquids between meals instead of during meals also seems to help nausea and vomiting.  If you develop constipation, eat more high-fiber foods, such as fresh vegetables or fruit and whole grains. Drink enough fluids to keep your urine clear or pale yellow. Activity and Exercise  Exercise only as directed by your health care provider. Exercising will help you:  Control your weight.  Stay in shape.  Be prepared for labor and delivery.  Experiencing pain or cramping in the lower abdomen or low back is a good sign that you should stop exercising. Check with your health care provider before continuing normal exercises.  Try to avoid standing for long periods of time. Move your legs often if you must stand in one place for a long time.  Avoid heavy lifting.  Wear low-heeled shoes, and practice good posture.  You may continue to have sex unless your health care provider directs you  otherwise. Relief of Pain or Discomfort  Wear a good support bra for breast tenderness.   Take warm sitz baths to soothe any pain or discomfort caused by hemorrhoids. Use hemorrhoid cream if your health care provider approves.   Rest with your legs elevated if you have leg cramps or low back pain.  If you develop varicose veins in your legs, wear support hose. Elevate your feet for 15 minutes, 3-4 times a day. Limit salt in your diet. Prenatal Care  Schedule your prenatal visits by the twelfth week of pregnancy. They are usually scheduled monthly at first, then more often in the last 2 months before delivery.  Write down your questions. Take them to your prenatal visits.  Keep all your prenatal visits as directed by your health care provider. Safety  Wear your seat belt at all times when driving.  Make a list of emergency phone numbers, including numbers for family, friends, the hospital, and police and fire departments. General Tips    Ask your health care provider for a referral to a local prenatal education class. Begin classes no later than at the beginning of month 6 of your pregnancy.  Ask for help if you have counseling or nutritional needs during pregnancy. Your health care provider can offer advice or refer you to specialists for help with various needs.  Do not use hot tubs, steam rooms, or saunas.  Do not douche or use tampons or scented sanitary pads.  Do not cross your legs for long periods of time.  Avoid cat litter boxes and soil used by cats. These carry germs that can cause birth defects in the baby and possibly loss of the fetus by miscarriage or stillbirth.  Avoid all smoking, herbs, alcohol, and medicines not prescribed by your health care provider. Chemicals in these affect the formation and growth of the baby.  Schedule a dentist appointment. At home, brush your teeth with a soft toothbrush and be gentle when you floss. SEEK MEDICAL CARE IF:   You have  dizziness.  You have mild pelvic cramps, pelvic pressure, or nagging pain in the abdominal area.  You have persistent nausea, vomiting, or diarrhea.  You have a bad smelling vaginal discharge.  You have pain with urination.  You notice increased swelling in your face, hands, legs, or ankles. SEEK IMMEDIATE MEDICAL CARE IF:   You have a fever.  You are leaking fluid from your vagina.  You have spotting or bleeding from your vagina.  You have severe abdominal cramping or pain.  You have rapid weight gain or loss.  You vomit blood or material that looks like coffee grounds.  You are exposed to [place] measles and have never had them.  You are exposed to fifth disease or chickenpox.  You develop a severe headache.  You have shortness of breath.  You have any kind of trauma, such as from a fall or a car accident. Document Released: 05/08/2001 Document Revised: 09/28/2013 Document Reviewed: 03/24/2013 [facility] Patient Information 2015 [place], [place]. This information is not intended to replace advice given to you by your health care provider. Make sure you discuss any questions you have with your health care provider.  Bacterial Vaginosis Bacterial vaginosis is a vaginal infection that occurs when the normal balance of bacteria in the vagina is disrupted. It results from an overgrowth of certain bacteria. This is the most common vaginal infection in women of childbearing age. Treatment is important to prevent complications, especially in pregnant women, as it can cause a premature delivery. CAUSES  Bacterial vaginosis is caused by an increase in harmful bacteria that are normally present in smaller amounts in the vagina. Several different kinds of bacteria can cause bacterial vaginosis. However, the reason that the condition develops is not fully understood. RISK FACTORS Certain activities or behaviors can put you at an increased risk of developing bacterial vaginosis,  including:  Having a new sex partner or multiple sex partners.  Douching.  Using an intrauterine device (IUD) for contraception. Women do not get bacterial vaginosis from toilet seats, bedding, swimming pools, or contact with objects around them. SIGNS AND SYMPTOMS  Some women with bacterial vaginosis have no signs or symptoms. Common symptoms include:  Grey vaginal discharge.  A fishlike odor with discharge, especially after sexual intercourse.  Itching or burning of the vagina and vulva.  Burning or pain with urination. DIAGNOSIS  Your health care provider will take a medical history and examine the vagina for signs of bacterial vaginosis. A sample  of vaginal fluid may be taken. Your health care provider will look at this sample under a microscope to check for bacteria and abnormal cells. A vaginal pH test may also be done.  TREATMENT  Bacterial vaginosis may be treated with antibiotic medicines. These may be given in the form of a pill or a vaginal cream. A second round of antibiotics may be prescribed if the condition comes back after treatment.  HOME CARE INSTRUCTIONS   Only take over-the-counter or prescription medicines as directed by your health care provider.  If antibiotic medicine was prescribed, take it as directed. Make sure you finish it even if you start to feel better.  Do not have sex until treatment is completed.  Tell all sexual partners that you have a vaginal infection. They should see their health care provider and be treated if they have problems, such as a mild rash or itching.  Practice safe sex by using condoms and only having one sex partner. SEEK MEDICAL CARE IF:   Your symptoms are not improving after 3 days of treatment.  You have increased discharge or pain.  You have a fever. MAKE SURE YOU:   Understand these instructions.  Will watch your condition.  Will get help right away if you are not doing well or get worse. FOR MORE INFORMATION   Centers for Disease Control and Prevention, Division of STD Prevention: [employer] American Sexual Health Association (ASHA): www.ashastd.org  Document Released: 05/14/2005 Document Revised: 03/04/2013 Document Reviewed: 12/24/2012 [facility] Patient Information 2015 [place], [place]. This information is not intended to replace advice given to you by your health care provider. Make sure you discuss any questions you have with your health care provider.

## 2014-07-14 NOTE — Progress Notes (Signed)
High Risk Pregnancy Diagnosis(es): Di-Di Twin gestation G3P1011 [redacted]w[redacted]d Estimated Date of Delivery: 01/23/15 BP 102/60 mmHg  Wt 208 lb (94.348 kg)  LMP 04/12/2014 (Within Days)  Urinalysis: Negative HPI:  Doing well, has white d/c w/ some odor x 1.5wks BP, weight, and urine reviewed.  No fm yet. Denies cramping, lof, vb, uti s/s.   Fundal Height:  12wk w/ twins Fetal Heart rate:  159/163 u/s Edema:  None Spec exam: cx visually closed, mod amount thin white malodorous d/c, wet prep: many clues= BV, rx flagyl  Reviewed today's normal twin NT u/s. Fibroid has increased from 9.3cm to 10.4cm- pt reports occ pain- nothing that really bothers her. Discussed warning s/s to report All questions were answered Assessment: [redacted]w[redacted]d Di-Di Twin IUP, large uterine fibroid, BV Medication(s) Plans:  n/a Treatment Plan:  Continue q 4wk appts for now Follow up in 4wks for high-risk OB appt and 2nd IT 1st IT/NT today

## 2014-07-14 NOTE — Progress Notes (Signed)
U/S(12+3wks)-Di/DI Twins, active fetus x2, cx appears closed, bilateral adnexa appears WNL, fibroid-10.4 x 6.7cm,  Baby A- CRL c/w dates, FHR-159 bpm, NB present, NT-1.71mm, Posterior Gr 0 placenta Baby B-CRL c/w dates, FHR- 163 bpm, NB present, NT-1.33mm, posterior Gr 0 placenta

## 2014-07-16 LAB — MATERNAL SCREEN, INTEGRATED #1
CROWN RUMP LENGTH TWIN B: 66.9 mm
Crown Rump Length: 62.8 mm
Gest. Age on Collection Date: 12.9 weeks
MATERNAL AGE AT EDD: 23.3 a
NT Twin B: 1.6 mm
NUCHAL TRANSLUCENCY (NT): 1.5 mm
NUMBER OF FETUSES: 2
PAPP-A VALUE: 2836.3 ng/mL
Weight: 208 [lb_av]

## 2014-08-11 ENCOUNTER — Encounter: Payer: Self-pay | Admitting: Women's Health

## 2014-08-11 ENCOUNTER — Ambulatory Visit (INDEPENDENT_AMBULATORY_CARE_PROVIDER_SITE_OTHER): Payer: Medicaid Other | Admitting: Women's Health

## 2014-08-11 VITALS — BP 118/58 | HR 104 | Wt 216.0 lb

## 2014-08-11 DIAGNOSIS — O30042 Twin pregnancy, dichorionic/diamniotic, second trimester: Secondary | ICD-10-CM | POA: Diagnosis not present

## 2014-08-11 DIAGNOSIS — Z3682 Encounter for antenatal screening for nuchal translucency: Secondary | ICD-10-CM

## 2014-08-11 DIAGNOSIS — Z1389 Encounter for screening for other disorder: Secondary | ICD-10-CM

## 2014-08-11 DIAGNOSIS — O0992 Supervision of high risk pregnancy, unspecified, second trimester: Secondary | ICD-10-CM | POA: Diagnosis not present

## 2014-08-11 DIAGNOSIS — Z331 Pregnant state, incidental: Secondary | ICD-10-CM

## 2014-08-11 DIAGNOSIS — Z363 Encounter for antenatal screening for malformations: Secondary | ICD-10-CM

## 2014-08-11 LAB — POCT URINALYSIS DIPSTICK
Glucose, UA: NEGATIVE
KETONES UA: NEGATIVE
Leukocytes, UA: NEGATIVE
Nitrite, UA: NEGATIVE
PROTEIN UA: NEGATIVE
RBC UA: NEGATIVE

## 2014-08-11 NOTE — Patient Instructions (Signed)
Second Trimester of Pregnancy The second trimester is from week 13 through week 28, months 4 through 6. The second trimester is often a time when you feel your best. Your body has also adjusted to being pregnant, and you begin to feel better physically. Usually, morning sickness has lessened or quit completely, you may have more energy, and you may have an increase in appetite. The second trimester is also a time when the fetus is growing rapidly. At the end of the sixth month, the fetus is about 9 inches long and weighs about 1 pounds. You will likely begin to feel the baby move (quickening) between 18 and 20 weeks of the pregnancy. BODY CHANGES Your body goes through many changes during pregnancy. The changes vary from woman to woman.   Your weight will continue to increase. You will notice your lower abdomen bulging out.  You may begin to get stretch marks on your hips, abdomen, and breasts.  You may develop headaches that can be relieved by medicines approved by your health care provider.  You may urinate more often because the fetus is pressing on your bladder.  You may develop or continue to have heartburn as a result of your pregnancy.  You may develop constipation because certain hormones are causing the muscles that push waste through your intestines to slow down.  You may develop hemorrhoids or swollen, bulging veins (varicose veins).  You may have back pain because of the weight gain and pregnancy hormones relaxing your joints between the bones in your pelvis and as a result of a shift in weight and the muscles that support your balance.  Your breasts will continue to grow and be tender.  Your gums may bleed and may be sensitive to brushing and flossing.  Dark spots or blotches (chloasma, mask of pregnancy) may develop on your face. This will likely fade after the baby is born.  A dark line from your belly button to the pubic area (linea nigra) may appear. This will likely fade  after the baby is born.  You may have changes in your hair. These can include thickening of your hair, rapid growth, and changes in texture. Some women also have hair loss during or after pregnancy, or hair that feels dry or thin. Your hair will most likely return to normal after your baby is born. WHAT TO EXPECT AT YOUR PRENATAL VISITS During a routine prenatal visit:  You will be weighed to make sure you and the fetus are growing normally.  Your blood pressure will be taken.  Your abdomen will be measured to track your baby's growth.  The fetal heartbeat will be listened to.  Any test results from the previous visit will be discussed. Your health care provider may ask you:  How you are feeling.  If you are feeling the baby move.  If you have had any abnormal symptoms, such as leaking fluid, bleeding, severe headaches, or abdominal cramping.  If you have any questions. Other tests that may be performed during your second trimester include:  Blood tests that check for:  Low iron levels (anemia).  Gestational diabetes (between 24 and 28 weeks).  Rh antibodies.  Urine tests to check for infections, diabetes, or protein in the urine.  An ultrasound to confirm the proper growth and development of the baby.  An amniocentesis to check for possible genetic problems.  Fetal screens for spina bifida and Down syndrome. HOME CARE INSTRUCTIONS   Avoid all smoking, herbs, alcohol, and unprescribed   drugs. These chemicals affect the formation and growth of the baby.  Follow your health care provider's instructions regarding medicine use. There are medicines that are either safe or unsafe to take during pregnancy.  Exercise only as directed by your health care provider. Experiencing uterine cramps is a good sign to stop exercising.  Continue to eat regular, healthy meals.  Wear a good support bra for breast tenderness.  Do not use hot tubs, steam rooms, or saunas.  Wear your  seat belt at all times when driving.  Avoid raw meat, uncooked cheese, cat litter boxes, and soil used by cats. These carry germs that can cause birth defects in the baby.  Take your prenatal vitamins.  Try taking a stool softener (if your health care provider approves) if you develop constipation. Eat more high-fiber foods, such as fresh vegetables or fruit and whole grains. Drink plenty of fluids to keep your urine clear or pale yellow.  Take warm sitz baths to soothe any pain or discomfort caused by hemorrhoids. Use hemorrhoid cream if your health care provider approves.  If you develop varicose veins, wear support hose. Elevate your feet for 15 minutes, 3-4 times a day. Limit salt in your diet.  Avoid heavy lifting, wear low heel shoes, and practice good posture.  Rest with your legs elevated if you have leg cramps or low back pain.  Visit your dentist if you have not gone yet during your pregnancy. Use a soft toothbrush to brush your teeth and be gentle when you floss.  A sexual relationship may be continued unless your health care provider directs you otherwise.  Continue to go to all your prenatal visits as directed by your health care provider. SEEK MEDICAL CARE IF:   You have dizziness.  You have mild pelvic cramps, pelvic pressure, or nagging pain in the abdominal area.  You have persistent nausea, vomiting, or diarrhea.  You have a bad smelling vaginal discharge.  You have pain with urination. SEEK IMMEDIATE MEDICAL CARE IF:   You have a fever.  You are leaking fluid from your vagina.  You have spotting or bleeding from your vagina.  You have severe abdominal cramping or pain.  You have rapid weight gain or loss.  You have shortness of breath with chest pain.  You notice sudden or extreme swelling of your face, hands, ankles, feet, or legs.  You have not felt your baby move in over an hour.  You have severe headaches that do not go away with  medicine.  You have vision changes. Document Released: 05/08/2001 Document Revised: 05/19/2013 Document Reviewed: 07/15/2012 [employer] Patient Information 2015 [employer]. This information is not intended to replace advice given to you by your health care provider. Make sure you discuss any questions you have with your health care provider.  

## 2014-08-11 NOTE — Progress Notes (Signed)
High Risk Pregnancy Diagnosis(es): Di-Di Twin gestation G3P1011 [redacted]w[redacted]d Estimated Date of Delivery: 01/23/15 LMP 04/12/2014 (Within Days)  Urinalysis: Negative HPI:  Doing well BP, weight, and urine reviewed.  No fm yet. Denies cramping, lof, vb, uti s/s. No complaints.  Fundal Height:  16wk w/ twins Fetal Heart rate:  155/163 Edema:  none  Reviewed warning s/s to report All questions were answered Assessment: [redacted]w[redacted]d Di-Di Twin gestation Medication(s) Plans:  n/a Treatment Plan:  Continue q 4wk appts Follow up in 4wks for high-risk OB appt and twin anatomy u/s 2nd IT today

## 2014-08-13 LAB — MATERNAL SCREEN, INTEGRATED #2
AFP MARKER: 58 ng/mL
AFP MoM: 1.99
Crown Rump Length Twin B: 66.9 mm
Crown Rump Length: 62.8 mm
DIA MoM: 2.03
DIA Value: 294.9 pg/mL
Estriol, Unconjugated: 1.8 ng/mL
GEST. AGE ON COLLECTION DATE: 12.9 wk
Gestational Age: 16.9 weeks
Maternal Age at EDD: 23.3 years
NT MoM Twin B: 1.12
NT Twin B: 1.6 mm
NUCHAL TRANSLUCENCY (NT): 1.5 mm
NUCHAL TRANSLUCENCY MOM: 1.13
Number of Fetuses: 2
PAPP-A MoM: 3.68
PAPP-A Value: 2836.3 ng/mL
Weight: 208 [lb_av]
Weight: 216 [lb_av]
hCG MoM: 3.46
hCG Value: 78 IU/mL
uE3 MoM: 1.98

## 2014-09-06 ENCOUNTER — Other Ambulatory Visit: Payer: Self-pay | Admitting: Women's Health

## 2014-09-06 DIAGNOSIS — Z363 Encounter for antenatal screening for malformations: Secondary | ICD-10-CM

## 2014-09-08 ENCOUNTER — Ambulatory Visit (INDEPENDENT_AMBULATORY_CARE_PROVIDER_SITE_OTHER): Payer: Medicaid Other

## 2014-09-08 ENCOUNTER — Ambulatory Visit (INDEPENDENT_AMBULATORY_CARE_PROVIDER_SITE_OTHER): Payer: Medicaid Other | Admitting: Advanced Practice Midwife

## 2014-09-08 VITALS — BP 96/58 | HR 68 | Wt 217.0 lb

## 2014-09-08 DIAGNOSIS — O30042 Twin pregnancy, dichorionic/diamniotic, second trimester: Secondary | ICD-10-CM | POA: Diagnosis not present

## 2014-09-08 DIAGNOSIS — O09892 Supervision of other high risk pregnancies, second trimester: Secondary | ICD-10-CM | POA: Diagnosis not present

## 2014-09-08 DIAGNOSIS — Z363 Encounter for antenatal screening for malformations: Secondary | ICD-10-CM

## 2014-09-08 DIAGNOSIS — Z1389 Encounter for screening for other disorder: Secondary | ICD-10-CM | POA: Diagnosis not present

## 2014-09-08 DIAGNOSIS — Z331 Pregnant state, incidental: Secondary | ICD-10-CM | POA: Diagnosis not present

## 2014-09-08 DIAGNOSIS — Z36 Encounter for antenatal screening of mother: Secondary | ICD-10-CM

## 2014-09-08 LAB — POCT URINALYSIS DIPSTICK
Glucose, UA: NEGATIVE
Ketones, UA: NEGATIVE
LEUKOCYTES UA: NEGATIVE
NITRITE UA: NEGATIVE
Protein, UA: NEGATIVE
RBC UA: NEGATIVE

## 2014-09-08 NOTE — Progress Notes (Signed)
[place] twins DI/DI, cx,4.32,post pl   BABY A cephalic rt,measurements c/w dates,efw 445g,fht 148bpm,anatomy scan complete w no obvious abnormalities,sdp of fluid 4.8cm                                                     BABY B cephalic lt,measurements,c/w dates,efw 401g,fht 141bpm,limited view of heart and face,no obvious abnormalities,sdp of fluid 4.3cm

## 2014-09-08 NOTE — Progress Notes (Signed)
Fetal Surveillance Testing today:  Ultrasound   High Risk Pregnancy Diagnosis(es):   Di/Di twins  [id number] [redacted]w[redacted]d Estimated Date of Delivery: 01/23/15  Last menstrual period 04/12/2014, not currently breastfeeding.  Urinalysis: Negative  HPI: The patient is being seen today for ongoing management of pregnancy/ DI/DI twins. Today she reports no complaints, feels well  BP weight and urine results all reviewed and noted. Patient reports good fetal movement, denies any bleeding and no rupture of membranes symptoms or regular contractions Fetal Heart rate:  148 Edema:  no  Patient is without complaints other than noted in her HPI. All questions were answered.  All lab and sonogram results have been reviewed. Comments: normal Baby A; limited vies of Baby  B;CX, fluid normal  Assessment:  1.  Pregnancy at [redacted]w[redacted]d,  Estimated Date of Delivery: 01/23/15 :                          2.  Di/Di twins, normal anatomy limited view of B                          Medication(s) Plans:  none  Treatment Plan:  Growth [place] q month  Follow up in 4 weeks for appointment for high risk OB care, [place] (growht, FU anatomy B)

## 2014-10-05 ENCOUNTER — Other Ambulatory Visit: Payer: Self-pay | Admitting: Advanced Practice Midwife

## 2014-10-05 DIAGNOSIS — O30042 Twin pregnancy, dichorionic/diamniotic, second trimester: Secondary | ICD-10-CM

## 2014-10-06 ENCOUNTER — Ambulatory Visit (INDEPENDENT_AMBULATORY_CARE_PROVIDER_SITE_OTHER): Payer: Medicaid Other

## 2014-10-06 ENCOUNTER — Ambulatory Visit (INDEPENDENT_AMBULATORY_CARE_PROVIDER_SITE_OTHER): Payer: Medicaid Other | Admitting: Advanced Practice Midwife

## 2014-10-06 ENCOUNTER — Encounter: Payer: Self-pay | Admitting: Advanced Practice Midwife

## 2014-10-06 VITALS — BP 102/58 | HR 64 | Wt 223.0 lb

## 2014-10-06 DIAGNOSIS — O09892 Supervision of other high risk pregnancies, second trimester: Secondary | ICD-10-CM | POA: Diagnosis not present

## 2014-10-06 DIAGNOSIS — O30042 Twin pregnancy, dichorionic/diamniotic, second trimester: Secondary | ICD-10-CM

## 2014-10-06 DIAGNOSIS — Z331 Pregnant state, incidental: Secondary | ICD-10-CM

## 2014-10-06 DIAGNOSIS — Z1389 Encounter for screening for other disorder: Secondary | ICD-10-CM

## 2014-10-06 LAB — POCT URINALYSIS DIPSTICK
Blood, UA: NEGATIVE
Glucose, UA: NEGATIVE
Leukocytes, UA: NEGATIVE
Nitrite, UA: NEGATIVE
Protein, UA: NEGATIVE

## 2014-10-06 LAB — US OB FOLLOW UP

## 2014-10-06 NOTE — Progress Notes (Signed)
Fetal Surveillance Testing today:  [place]   High Risk Pregnancy Diagnosis(es):   Di/Di twins  [id number] [redacted]w[redacted]d Estimated Date of Delivery: 01/23/15  Last menstrual period 04/12/2014, not currently breastfeeding.  Urinalysis: Negative   HPI: The patient is being seen today for ongoing management of pregnancy and di/di twins.. Today she reports no c/o, feels good   BP weight and urine results all reviewed and noted. Patient reports good fetal movement, denies any bleeding and no rupture of membranes symptoms or regular contractions.   Edema:  none  Patient is without complaints other than noted in her HPI. All questions were answered.  All lab and sonogram results have been reviewed.TWINS 24+3wks, post pl,gr 0,normal ov's bilat,fundal fibroid 8.5 x 6.5 x 7.2 cm  BABY A measurements c/w dates,efw 858g 70.6%,afi 4.5cm sdp  BABY B Measurements c/w dates,efw 777g 59.1%,afi 3.7 cm sdp,anatomy complete,no obvious abn seen Comments: normal   Assessment:  1.  Pregnancy at [redacted]w[redacted]d,  Estimated Date of Delivery: 01/23/15 :                          2.  Di/Di twins, concordant growth                        3.    Medication(s) Plans:  none  Treatment Plan:  Pn2/EFW/HROB 4 weeks  Follow up in 4 weeks for appointment for high risk OB care,

## 2014-10-06 NOTE — Patient Instructions (Signed)

## 2014-10-06 NOTE — Progress Notes (Signed)
[place] TWINS 24+3wks, post pl,gr 0,normal ov's bilat,fundal fibroid 8.5 x 6.5 x 7.2 cm        BABY A  measurements c/w dates,efw 858g 70.6%,afi 4.5cm sdp        BABY B  Measurements c/w dates,efw 777g 59.1%,afi 3.7 cm sdp,anatomy complete,no obvious abn seen

## 2014-10-26 LAB — US OB DETAIL ADDL GEST + 14 WK

## 2014-10-27 ENCOUNTER — Other Ambulatory Visit: Payer: Self-pay | Admitting: Advanced Practice Midwife

## 2014-10-27 DIAGNOSIS — IMO0002 Reserved for concepts with insufficient information to code with codable children: Secondary | ICD-10-CM

## 2014-10-27 DIAGNOSIS — Z0489 Encounter for examination and observation for other specified reasons: Secondary | ICD-10-CM

## 2014-10-27 DIAGNOSIS — O09892 Supervision of other high risk pregnancies, second trimester: Secondary | ICD-10-CM

## 2014-10-27 DIAGNOSIS — O30042 Twin pregnancy, dichorionic/diamniotic, second trimester: Secondary | ICD-10-CM

## 2014-11-03 ENCOUNTER — Other Ambulatory Visit: Payer: Medicaid Other

## 2014-11-03 ENCOUNTER — Encounter: Payer: Self-pay | Admitting: Women's Health

## 2014-11-03 ENCOUNTER — Ambulatory Visit (INDEPENDENT_AMBULATORY_CARE_PROVIDER_SITE_OTHER): Payer: Medicaid Other

## 2014-11-03 ENCOUNTER — Ambulatory Visit (INDEPENDENT_AMBULATORY_CARE_PROVIDER_SITE_OTHER): Payer: Medicaid Other | Admitting: Women's Health

## 2014-11-03 VITALS — BP 100/60 | HR 100 | Wt 227.0 lb

## 2014-11-03 DIAGNOSIS — Z1389 Encounter for screening for other disorder: Secondary | ICD-10-CM

## 2014-11-03 DIAGNOSIS — Z0489 Encounter for examination and observation for other specified reasons: Secondary | ICD-10-CM

## 2014-11-03 DIAGNOSIS — Z3A28 28 weeks gestation of pregnancy: Secondary | ICD-10-CM

## 2014-11-03 DIAGNOSIS — D259 Leiomyoma of uterus, unspecified: Secondary | ICD-10-CM

## 2014-11-03 DIAGNOSIS — O09893 Supervision of other high risk pregnancies, third trimester: Secondary | ICD-10-CM | POA: Diagnosis not present

## 2014-11-03 DIAGNOSIS — O30043 Twin pregnancy, dichorionic/diamniotic, third trimester: Secondary | ICD-10-CM

## 2014-11-03 DIAGNOSIS — Z331 Pregnant state, incidental: Secondary | ICD-10-CM

## 2014-11-03 DIAGNOSIS — O30042 Twin pregnancy, dichorionic/diamniotic, second trimester: Secondary | ICD-10-CM | POA: Diagnosis not present

## 2014-11-03 DIAGNOSIS — Z369 Encounter for antenatal screening, unspecified: Secondary | ICD-10-CM

## 2014-11-03 DIAGNOSIS — O3410 Maternal care for benign tumor of corpus uteri, unspecified trimester: Secondary | ICD-10-CM

## 2014-11-03 DIAGNOSIS — O09892 Supervision of other high risk pregnancies, second trimester: Secondary | ICD-10-CM

## 2014-11-03 DIAGNOSIS — IMO0002 Reserved for concepts with insufficient information to code with codable children: Secondary | ICD-10-CM

## 2014-11-03 DIAGNOSIS — Z131 Encounter for screening for diabetes mellitus: Secondary | ICD-10-CM

## 2014-11-03 LAB — POCT URINALYSIS DIPSTICK
Blood, UA: NEGATIVE
GLUCOSE UA: NEGATIVE
KETONES UA: 3
Leukocytes, UA: NEGATIVE
Nitrite, UA: NEGATIVE
Protein, UA: NEGATIVE

## 2014-11-03 LAB — OB RESULTS CONSOLE HIV ANTIBODY (ROUTINE TESTING): HIV: NONREACTIVE

## 2014-11-03 NOTE — Progress Notes (Signed)
High Risk Pregnancy Diagnosis(es): Di-Di Twin gestation G3P1011 [redacted]w[redacted]d Estimated Date of Delivery: 01/23/15 BP 100/60 mmHg  Pulse 100  Wt 227 lb (102.967 kg)  LMP 04/12/2014 (Within Days)  Urinalysis: Positive for 3+ketones, to increase fluid after 2hr gtt HPI:  Doing well BP, weight, and urine reviewed.  Reports good fm. Denies regular uc's, lof, vb, uti s/s. No complaints.  Fundal Height:  35 Fetal Heart rate:  138/139 u/s Edema:  trace  Reviewed today's normal u/s, discordance 1%, twin b anatomy complete and normal. Stable large fibroid. Discussed ptl s/s, fkc. Recommended Tdap at HD/PCP per CDC guidelines.  All questions were answered Assessment: [redacted]w[redacted]d Di-Di twin gestation Medication(s) Plans:  n/a Treatment Plan:  Growth/afi u/s in 4wks, begin 2x/wk testing at 35wks, Del @ 38wks Follow up in 4wks for high-risk OB appt  PN2 today

## 2014-11-03 NOTE — Progress Notes (Signed)
[place] TWINS  Baby A breech rt,post pl gr 2,fundal fibroid 10.7 x 7.3 x 6.56cm,afi sdp 4.5cm,cx 3.2cm,normal ov's bilat,fht 138bpm,efw 1347g 63%                     Baby B cephalic HK,[id number] 92.9%,[id number] 1%,fht139bpm,facial anatomy and heart complete,no obvious abn seen,afi sdp 4.7cm

## 2014-11-03 NOTE — Patient Instructions (Signed)

## 2014-11-04 LAB — CBC
Hematocrit: 32.3 % — ABNORMAL LOW (ref 34.0–46.6)
Hemoglobin: 10.9 g/dL — ABNORMAL LOW (ref 11.1–15.9)
MCH: 30.5 pg (ref 26.6–33.0)
MCHC: 33.7 g/dL (ref 31.5–35.7)
MCV: 91 fL (ref 79–97)
PLATELETS: 196 10*3/uL (ref 150–379)
RBC: 3.57 x10E6/uL — AB (ref 3.77–5.28)
RDW: 12.7 % (ref 12.3–15.4)
WBC: 7.7 10*3/uL (ref 3.4–10.8)

## 2014-11-04 LAB — GLUCOSE TOLERANCE, 2 HOURS W/ 1HR
GLUCOSE, 1 HOUR: 108 mg/dL (ref 65–179)
GLUCOSE, 2 HOUR: 101 mg/dL (ref 65–152)
GLUCOSE, FASTING: 67 mg/dL (ref 65–91)

## 2014-11-04 LAB — RPR: RPR Ser Ql: NONREACTIVE

## 2014-11-04 LAB — HSV 2 ANTIBODY, IGG: HSV 2 Glycoprotein G Ab, IgG: <0.91 index (ref 0.00–0.90)

## 2014-11-04 LAB — ANTIBODY SCREEN: Antibody Screen: NEGATIVE

## 2014-11-04 LAB — HIV ANTIBODY (ROUTINE TESTING W REFLEX): HIV Screen 4th Generation wRfx: NONREACTIVE

## 2014-11-18 ENCOUNTER — Encounter (HOSPITAL_COMMUNITY)
Admission: AD | Disposition: A | Discharge status: Home / Self Care | Payer: Self-pay | Source: Ambulatory Visit | Attending: Family Medicine

## 2014-11-18 ENCOUNTER — Inpatient Hospital Stay (HOSPITAL_COMMUNITY)
Admission: AD | Admit: 2014-11-18 | Discharge: 2014-11-21 | DRG: 765 | Disposition: A | Discharge status: Home / Self Care | Payer: Medicaid Other | Source: Ambulatory Visit | Attending: Family Medicine | Admitting: Family Medicine

## 2014-11-18 ENCOUNTER — Telehealth: Payer: Self-pay | Admitting: *Deleted

## 2014-11-18 ENCOUNTER — Inpatient Hospital Stay (HOSPITAL_COMMUNITY): Payer: Medicaid Other | Admitting: Anesthesiology

## 2014-11-18 ENCOUNTER — Inpatient Hospital Stay (HOSPITAL_COMMUNITY): Payer: Medicaid Other

## 2014-11-18 ENCOUNTER — Encounter (HOSPITAL_COMMUNITY): Payer: Self-pay | Admitting: *Deleted

## 2014-11-18 DIAGNOSIS — O328XX2 Maternal care for other malpresentation of fetus, fetus 2: Secondary | ICD-10-CM | POA: Diagnosis not present

## 2014-11-18 DIAGNOSIS — O3413 Maternal care for benign tumor of corpus uteri, third trimester: Secondary | ICD-10-CM

## 2014-11-18 DIAGNOSIS — O30043 Twin pregnancy, dichorionic/diamniotic, third trimester: Secondary | ICD-10-CM | POA: Diagnosis not present

## 2014-11-18 DIAGNOSIS — Z3A30 30 weeks gestation of pregnancy: Secondary | ICD-10-CM | POA: Diagnosis present

## 2014-11-18 DIAGNOSIS — O09893 Supervision of other high risk pregnancies, third trimester: Secondary | ICD-10-CM

## 2014-11-18 DIAGNOSIS — Z91010 Allergy to peanuts: Secondary | ICD-10-CM | POA: Diagnosis not present

## 2014-11-18 DIAGNOSIS — D259 Leiomyoma of uterus, unspecified: Secondary | ICD-10-CM | POA: Diagnosis present

## 2014-11-18 DIAGNOSIS — Z23 Encounter for immunization: Secondary | ICD-10-CM | POA: Diagnosis not present

## 2014-11-18 DIAGNOSIS — O30049 Twin pregnancy, dichorionic/diamniotic, unspecified trimester: Secondary | ICD-10-CM | POA: Diagnosis present

## 2014-11-18 LAB — TYPE AND SCREEN
ABO/RH(D): O POS
ANTIBODY SCREEN: NEGATIVE

## 2014-11-18 LAB — CBC
HEMATOCRIT: 30.7 % — AB (ref 36.0–46.0)
Hemoglobin: 10.5 g/dL — ABNORMAL LOW (ref 12.0–15.0)
MCH: 31 pg (ref 26.0–34.0)
MCHC: 34.2 g/dL (ref 30.0–36.0)
MCV: 90.6 fL (ref 78.0–100.0)
PLATELETS: 175 10*3/uL (ref 150–400)
RBC: 3.39 MIL/uL — ABNORMAL LOW (ref 3.87–5.11)
RDW: 12.8 % (ref 11.5–15.5)
WBC: 9.6 10*3/uL (ref 4.0–10.5)

## 2014-11-18 LAB — URINALYSIS, ROUTINE W REFLEX MICROSCOPIC
Bilirubin Urine: NEGATIVE
Glucose, UA: NEGATIVE mg/dL
LEUKOCYTES UA: NEGATIVE
Nitrite: NEGATIVE
PROTEIN: NEGATIVE mg/dL
Specific Gravity, Urine: >1.03 — ABNORMAL HIGH (ref 1.005–1.030)
UROBILINOGEN UA: 0.2 mg/dL (ref 0.0–1.0)
pH: 6 (ref 5.0–8.0)

## 2014-11-18 LAB — GROUP B STREP BY PCR: Group B strep by PCR: NEGATIVE

## 2014-11-18 LAB — RAPID URINE DRUG SCREEN, HOSP PERFORMED
AMPHETAMINES: NOT DETECTED
BARBITURATES: NOT DETECTED
Benzodiazepines: NOT DETECTED
Cocaine: NOT DETECTED
OPIATES: NOT DETECTED
Tetrahydrocannabinol: NOT DETECTED

## 2014-11-18 LAB — URINE MICROSCOPIC-ADD ON

## 2014-11-18 SURGERY — Surgical Case
Anesthesia: Spinal | Site: Abdomen

## 2014-11-18 MED ORDER — PHENYLEPHRINE 8 MG IN D5W 100 ML (0.08MG/ML) PREMIX OPTIME
INJECTION | INTRAVENOUS | Status: AC
  Filled 2014-11-18: qty 100

## 2014-11-18 MED ORDER — MAGNESIUM SULFATE BOLUS VIA INFUSION
4.0000 g | Freq: Once | INTRAVENOUS | Status: AC
  Administered 2014-11-18: 4 g via INTRAVENOUS
  Filled 2014-11-18: qty 500

## 2014-11-18 MED ORDER — FENTANYL CITRATE (PF) 100 MCG/2ML IJ SOLN
INTRAMUSCULAR | Status: AC
  Filled 2014-11-18: qty 2

## 2014-11-18 MED ORDER — DOCUSATE SODIUM 100 MG PO CAPS
100.0000 mg | ORAL_CAPSULE | Freq: Every day | ORAL | Status: DC
  Filled 2014-11-18: qty 1

## 2014-11-18 MED ORDER — CEFAZOLIN SODIUM-DEXTROSE 2-3 GM-% IV SOLR
INTRAVENOUS | Status: AC
  Filled 2014-11-18: qty 50

## 2014-11-18 MED ORDER — PENICILLIN G POTASSIUM 5000000 UNITS IJ SOLR
5.0000 10*6.[IU] | Freq: Once | INTRAMUSCULAR | Status: AC
  Administered 2014-11-18: 5 10*6.[IU] via INTRAVENOUS
  Filled 2014-11-18: qty 5

## 2014-11-18 MED ORDER — PENICILLIN G POTASSIUM 5000000 UNITS IJ SOLR
2.5000 10*6.[IU] | INTRAMUSCULAR | Status: DC
Start: 2014-11-19 — End: 2014-11-19
  Filled 2014-11-18 (×5): qty 2.5

## 2014-11-18 MED ORDER — ACETAMINOPHEN 325 MG PO TABS: 650.0000 mg | ORAL_TABLET | ORAL | Status: DC | PRN

## 2014-11-18 MED ORDER — MAGNESIUM SULFATE 50 % IJ SOLN
2.0000 g/h | INTRAVENOUS | Status: DC
  Filled 2014-11-18: qty 80

## 2014-11-18 MED ORDER — MORPHINE SULFATE 0.5 MG/ML IJ SOLN
INTRAMUSCULAR | Status: AC
Start: 2014-11-18 — End: 2014-11-18
  Filled 2014-11-18: qty 10

## 2014-11-18 MED ORDER — FENTANYL CITRATE (PF) 100 MCG/2ML IJ SOLN
INTRAMUSCULAR | Status: DC | PRN
  Administered 2014-11-18: 25 ug via INTRATHECAL

## 2014-11-18 MED ORDER — MORPHINE SULFATE (PF) 0.5 MG/ML IJ SOLN
INTRAMUSCULAR | Status: DC | PRN
  Administered 2014-11-18: .15 mg via INTRATHECAL

## 2014-11-18 MED ORDER — ONDANSETRON HCL 4 MG/2ML IJ SOLN
INTRAMUSCULAR | Status: AC
  Filled 2014-11-18: qty 2

## 2014-11-18 MED ORDER — CALCIUM CARBONATE ANTACID 500 MG PO CHEW
2.0000 | CHEWABLE_TABLET | ORAL | Status: DC | PRN
  Filled 2014-11-18: qty 2

## 2014-11-18 MED ORDER — BUPIVACAINE HCL (PF) 0.25 % IJ SOLN
INTRAMUSCULAR | Status: DC | PRN
  Administered 2014-11-18: 30 mL

## 2014-11-18 MED ORDER — BUPIVACAINE IN DEXTROSE 0.75-8.25 % IT SOLN
INTRATHECAL | Status: DC | PRN
  Administered 2014-11-18: 1.2 mL via INTRATHECAL

## 2014-11-18 MED ORDER — SCOPOLAMINE 1 MG/3DAYS TD PT72
MEDICATED_PATCH | TRANSDERMAL | Status: AC
  Filled 2014-11-18: qty 1

## 2014-11-18 MED ORDER — PHENYLEPHRINE 8 MG IN D5W 100 ML (0.08MG/ML) PREMIX OPTIME
INJECTION | INTRAVENOUS | Status: DC | PRN
  Administered 2014-11-18: 80 ug/min via INTRAVENOUS

## 2014-11-18 MED ORDER — BUPIVACAINE HCL (PF) 0.25 % IJ SOLN
INTRAMUSCULAR | Status: AC
Start: 2014-11-18 — End: 2014-11-18
  Filled 2014-11-18: qty 30

## 2014-11-18 MED ORDER — SCOPOLAMINE 1 MG/3DAYS TD PT72
MEDICATED_PATCH | TRANSDERMAL | Status: DC | PRN
  Administered 2014-11-18: 1 via TRANSDERMAL

## 2014-11-18 MED ORDER — CEFAZOLIN SODIUM-DEXTROSE 2-3 GM-% IV SOLR
INTRAVENOUS | Status: DC | PRN
  Administered 2014-11-18: 2 g via INTRAVENOUS

## 2014-11-18 MED ORDER — PRENATAL MULTIVITAMIN CH: 1.0000 | ORAL_TABLET | Freq: Every day | ORAL | Status: DC

## 2014-11-18 MED ORDER — LACTATED RINGERS IV BOLUS (SEPSIS)
500.0000 mL | Freq: Once | INTRAVENOUS | Status: AC
  Administered 2014-11-18: 1000 mL via INTRAVENOUS

## 2014-11-18 MED ORDER — OXYTOCIN 10 UNIT/ML IJ SOLN
INTRAMUSCULAR | Status: AC
  Filled 2014-11-18: qty 4

## 2014-11-18 MED ORDER — LACTATED RINGERS IV SOLN
INTRAVENOUS | Status: DC
  Administered 2014-11-18 (×3): via INTRAVENOUS

## 2014-11-18 MED ORDER — CITRIC ACID-SODIUM CITRATE 334-500 MG/5ML PO SOLN
ORAL | Status: AC
  Administered 2014-11-18: 30 mL
  Filled 2014-11-18: qty 15

## 2014-11-18 MED ORDER — ONDANSETRON HCL 4 MG/2ML IJ SOLN
INTRAMUSCULAR | Status: DC | PRN
  Administered 2014-11-18: 4 mg via INTRAVENOUS

## 2014-11-18 MED ORDER — BETAMETHASONE SOD PHOS & ACET 6 (3-3) MG/ML IJ SUSP
12.0000 mg | INTRAMUSCULAR | Status: DC
  Administered 2014-11-18: 12 mg via INTRAMUSCULAR
  Filled 2014-11-18: qty 2

## 2014-11-18 MED ORDER — ZOLPIDEM TARTRATE 5 MG PO TABS: 5.0000 mg | ORAL_TABLET | Freq: Every evening | ORAL | Status: DC | PRN

## 2014-11-18 MED ORDER — PHENYLEPHRINE HCL 10 MG/ML IJ SOLN
INTRAMUSCULAR | Status: DC | PRN
  Administered 2014-11-18: 80 ug via INTRAVENOUS

## 2014-11-18 MED ORDER — OXYTOCIN 10 UNIT/ML IJ SOLN
40.0000 [IU] | INTRAMUSCULAR | Status: DC | PRN
  Administered 2014-11-18: 40 [IU] via INTRAVENOUS

## 2014-11-18 SURGICAL SUPPLY — 30 items
CLAMP CORD UMBIL (MISCELLANEOUS) IMPLANT
CLOSURE WOUND 1/2 X4 (GAUZE/BANDAGES/DRESSINGS) ×1
CLOTH BEACON ORANGE TIMEOUT ST (SAFETY) ×4 IMPLANT
DRAPE SHEET LG 3/4 BI-LAMINATE (DRAPES) IMPLANT
DRSG OPSITE POSTOP 4X10 (GAUZE/BANDAGES/DRESSINGS) ×4 IMPLANT
DURAPREP 26ML APPLICATOR (WOUND CARE) ×4 IMPLANT
ELECT REM PT RETURN 9FT ADLT (ELECTROSURGICAL) ×4
ELECTRODE REM PT RTRN 9FT ADLT (ELECTROSURGICAL) ×2 IMPLANT
EXTRACTOR VACUUM M CUP 4 TUBE (SUCTIONS) IMPLANT
EXTRACTOR VACUUM M CUP 4' TUBE (SUCTIONS)
GLOVE BIOGEL PI IND STRL 7.0 (GLOVE) ×2 IMPLANT
GLOVE BIOGEL PI INDICATOR 7.0 (GLOVE) ×2
GLOVE ECLIPSE 7.0 STRL STRAW (GLOVE) ×8 IMPLANT
GOWN STRL REUS W/TWL LRG LVL3 (GOWN DISPOSABLE) ×8 IMPLANT
KIT ABG SYR 3ML LUER SLIP (SYRINGE) IMPLANT
NEEDLE HYPO 22GX1.5 SAFETY (NEEDLE) ×4 IMPLANT
NEEDLE HYPO 25X5/8 SAFETYGLIDE (NEEDLE) IMPLANT
NS IRRIG 1000ML POUR BTL (IV SOLUTION) ×4 IMPLANT
PACK C SECTION WH (CUSTOM PROCEDURE TRAY) ×4 IMPLANT
PAD ABD 8X7 1/2 STERILE (GAUZE/BANDAGES/DRESSINGS) ×4 IMPLANT
PAD OB MATERNITY 4.3X12.25 (PERSONAL CARE ITEMS) ×4 IMPLANT
RTRCTR C-SECT PINK 25CM LRG (MISCELLANEOUS) ×4 IMPLANT
STAPLER VISISTAT 35W (STAPLE) IMPLANT
STRIP CLOSURE SKIN 1/2X4 (GAUZE/BANDAGES/DRESSINGS) ×3 IMPLANT
SUT VIC AB 0 CTX 36 (SUTURE) ×6
SUT VIC AB 0 CTX36XBRD ANBCTRL (SUTURE) ×6 IMPLANT
SUT VIC AB 4-0 KS 27 (SUTURE) IMPLANT
SYR 30ML LL (SYRINGE) ×4 IMPLANT
TOWEL OR 17X24 6PK STRL BLUE (TOWEL DISPOSABLE) ×4 IMPLANT
TRAY FOLEY CATH SILVER 14FR (SET/KITS/TRAYS/PACK) ×4 IMPLANT

## 2014-11-18 NOTE — Anesthesia Procedure Notes (Signed)
Spinal Patient location during procedure: OR Start time: 11/18/2014 10:52 PM Staffing Anesthesiologist: Josephine Igo Performed by: anesthesiologist  Preanesthetic Checklist Completed: patient identified, site marked, surgical consent, pre-op evaluation, timeout performed, IV checked, risks and benefits discussed and monitors and equipment checked Spinal Block Patient position: sitting Prep: site prepped and draped and DuraPrep Patient monitoring: heart rate, cardiac monitor, continuous pulse ox and blood pressure Approach: midline Location: L3-4 Injection technique: single-shot Needle Needle type: Sprotte  Needle gauge: 24 G Needle length: 9 cm Assessment Sensory level: T4 Additional Notes Patient tolerated procedure well. Adequate sensory level.

## 2014-11-18 NOTE — Transfer of Care (Signed)
Immediate Anesthesia Transfer of Care Note  Patient: Jacqueline Underwood  Procedure(s) Performed: Procedure(s): CESAREAN SECTION (N/A)  Patient Location: PACU  Anesthesia Type:Spinal  Level of Consciousness: awake, alert  and oriented  Airway & Oxygen Therapy: Patient Spontanous Breathing  Post-op Assessment: Report given to RN and Post -op Vital signs reviewed and stable  Post vital signs: Reviewed and stable  Last Vitals:  Filed Vitals:   11/18/14 2201  BP: 116/78  Pulse: 101  Temp:   Resp: 20    Complications: No apparent anesthesia complications

## 2014-11-18 NOTE — H&P (Signed)
Jacqueline Underwood is a 24 y.o. female G85P1011 with IUP at [redacted]w[redacted]d presenting for c/o q 10 minute contractions since 1630.  Also had some blood tinged mucus.  Membranes are intact, with active fetal movement.   [facility]  Prenatal History/Complications:  DiDi twins  Past Medical History: Past Medical History  Diagnosis Date  . Pregnant   . BV (bacterial vaginosis)   . Yeast infection   . UTI (urinary tract infection)   . Uterine fibroid in pregnancy     Past Surgical History: Past Surgical History  Procedure Laterality Date  . No past surgeries      Obstetrical History: OB History    Gravida Para Term Preterm AB TAB SAB Ectopic Multiple Living   3 1 1  1  1   1       GyneSocial History: History   Social History  . Marital Status: Single    Spouse Name: N/A  . Number of Children: N/A  . Years of Education: N/A   Social History Main Topics  . Smoking status: Never Smoker   . Smokeless tobacco: Never Used  . Alcohol Use: No  . Drug Use: No  . Sexual Activity: Not Currently    Birth Control/ Protection: None   Other Topics Concern  . None   Social History Narrative    Family History: Family History  Problem Relation Age of Onset  . Hypertension Father   . Diabetes Maternal Grandmother     Allergies: Allergies  Allergen Reactions  . Peanuts [Peanut Oil] Itching    Itching in throat.    Prescriptions prior to admission  Medication Sig Dispense Refill Last Dose  . acetaminophen (TYLENOL) 325 MG tablet Take 650 mg by mouth every 6 (six) hours as needed for mild pain, moderate pain or headache.    11/18/2014 at 1700  . loratadine (CLARITIN) 10 MG tablet Take 10 mg by mouth daily.   Past Month at Unknown time  . Prenatal Vit-Fe Fumarate-FA (PRENATAL MULTIVITAMIN) TABS Take 1 tablet by mouth daily.   11/18/2014 at Unknown time     Prenatal Transfer Tool  Maternal Diabetes: No Genetic Screening: Normal Maternal Ultrasounds/Referrals:  Normal Fetal Ultrasounds or other Referrals:  None Maternal Substance Abuse:  No Significant Maternal Medications:  None Significant Maternal Lab Results: None     Review of Systems   Constitutional: Negative for fever and chills Eyes: Negative for visual disturbances Respiratory: Negative for shortness of breath, dyspnea Cardiovascular: Negative for chest pain or palpitations  Gastrointestinal: Negative for vomiting, diarrhea and constipation.  POSITIVE for abdominal pain (contractions) Genitourinary: Negative for dysuria and urgency Musculoskeletal: Negative for back pain, joint pain, myalgias  Neurological: Negative for dizziness and headaches      Blood pressure 112/58, pulse 112, temperature 98.4 F (36.9 C), temperature source Oral, resp. rate 18, height 5' (1.524 m), weight 229 lb 9.6 oz (104.146 kg), last menstrual period 04/12/2014, not currently breastfeeding. General appearance: alert, cooperative and no distress.  No evidence of contractions on pt's face/demeanorO+ Lungs: clear to auscultation bilaterally Heart: regular rate and rhythm Abdomen: soft, non-tender; bowel sounds normal Extremities: Homans sign is negative, no sign of DVT DTR's 2+ Presentation: breech Fetal monitoring  Baseline: 145 bpm, Variability: Good {> 6 bpm), Accelerations: Reactive and Decelerations: Absent X2 Uterine activity    Pt says ctx are mild q 10 minutes.  Not tracing on EFM Dilation: 5.5 Effacement (%): 80 Station: -1 Exam by:: Nigel Berthold,  CNM   BBOW with feet felt   Prenatal labs: ABO, Rh:  O+ Antibody: Negative (06/08 0918) Rubella:  immune RPR: Non Reactive (06/08 0918)  HBsAg: Negative (01/27 1156)  HIV:   neg GBS:   collected 2hr Glucola 67/108/101 Genetic screening  normal Anatomy US normal X2   Results for orders placed or performed during the hospital encounter of 11/18/14 (from the past 24 hour(s))  Urinalysis, Routine w reflex microscopic (not at  [facility])   Collection Time: 11/18/14  6:10 PM  Result Value Ref Range   Color, Urine YELLOW YELLOW   APPearance HAZY (A) CLEAR   Specific Gravity, Urine >1.030 (H) 1.005 - 1.030   pH 6.0 5.0 - 8.0   Glucose, UA NEGATIVE NEGATIVE mg/dL   Hgb urine dipstick LARGE (A) NEGATIVE   Bilirubin Urine NEGATIVE NEGATIVE   Ketones, ur >80 (A) NEGATIVE mg/dL   Protein, ur NEGATIVE NEGATIVE mg/dL   Urobilinogen, UA 0.2 0.0 - 1.0 mg/dL   Nitrite NEGATIVE NEGATIVE   Leukocytes, UA NEGATIVE NEGATIVE  Urine microscopic-add on   Collection Time: 11/18/14  6:10 PM  Result Value Ref Range   Squamous Epithelial / LPF MANY (A) RARE   WBC, UA 0-2 <3 WBC/hpf   RBC / HPF 3-6 <3 RBC/hpf   Bacteria, UA RARE RARE   Urine-Other MUCOUS PRESENT     Assessment: Jacqueline Underwood is a 24 y.o. [id number] with an IUP at [redacted]w[redacted]d presenting for preterm labor.   Discussed with Dr. Kennon Rounds Plan: #Labor: expectant managment #Pain:  pending #FWB cat 1 #ID: GBS: pending  #MOF:  Breast/bottle #MOC: depo #[facility]: at [facility]   CRESENZO-DISHMAN,Tyqwan Pink 11/18/2014, 7:44 PM

## 2014-11-18 NOTE — Anesthesia Preprocedure Evaluation (Addendum)
Anesthesia Evaluation  Patient identified by MRN, date of birth, ID band Patient awake    Reviewed: Allergy & Precautions, NPO status , Patient's Chart, lab work & pertinent test results  Airway Mallampati: I  TM Distance: >3 FB Neck ROM: Full    Dental no notable dental hx. (+) Teeth Intact   Pulmonary neg pulmonary ROS,  breath sounds clear to auscultation  Pulmonary exam normal       Cardiovascular negative cardio ROS Normal cardiovascular examRhythm:Regular Rate:Normal     Neuro/Psych negative neurological ROS  negative psych ROS   GI/Hepatic negative GI ROS, Neg liver ROS,   Endo/Other  Morbid obesity  Renal/GU negative Renal ROS  negative genitourinary   Musculoskeletal negative musculoskeletal ROS (+)   Abdominal (+) + obese,   Peds  Hematology  (+) anemia ,   Anesthesia Other Findings   Reproductive/Obstetrics (+) Pregnancy Twin gestation - 30 4/7 weeks Breech presentation                            Anesthesia Physical Anesthesia Plan  ASA: III and emergent  Anesthesia Plan: Spinal   Post-op Pain Management:    Induction:   Airway Management Planned: Natural Airway  Additional Equipment:   Intra-op Plan:   Post-operative Plan:   Informed Consent: I have reviewed the patients History and Physical, chart, labs and discussed the procedure including the risks, benefits and alternatives for the proposed anesthesia with the patient or authorized representative who has indicated his/her understanding and acceptance.     Plan Discussed with: CRNA, Anesthesiologist and Surgeon  Anesthesia Plan Comments:         Anesthesia Quick Evaluation

## 2014-11-18 NOTE — Consult Note (Signed)
Neonatology Note:   Attendance at C-section:    I was asked by Dr. Kennon Rounds to attend this primary C/S at 30 4/7 weeks, Di-Di twins. The mother is a G3P1A1 O pos, GBS pending with onset of preterm labor, rapidly progressing tonight, and breech presentation. She got 1 dose of Betamethasone and started on Magnesium sulfate 3 hours before delivery. She also received 1 dose of Pen G 2 hours before delivery, and she was afebrile. ROM at delivery, fluid clear.  Twin A, a boy, delivered vertex. Infant vigorous with good spontaneous cry and tone. Delayed cord clamping was done.  Needed bulb suctioning. We placed a pulse oximeter and his O2 saturations were in the 60s at 3-4 minutes, so BBO2 was given, followed by neopuff CPAP. He responded well and was able to be weaned to 21-25%. Air exchange was good. He had moderate subcostal retractions. Ap 8/9.    Twin B, a boy, delivered footling breech. Infant had slightly decreased muscle tone, but normal HR and was breathing. Delayed cord clamping was done. Needed bulb suctioning several times for small amounts of mucous. He began to cry with vigor at about 1.5 minutes. A pulse oximeter was placed and his O2 saturations were in the 80s at 4-5 minutes. However, within the next few minutes, he began to have retractions, so was placed on neopuff CPAP, with improvement. Ap 7/9.   The babies were shown to their parents in the DR, then were transported to the NICU for further care, on neopuff CPAP. The father accompanied them to NICU.    Real Cons, MD

## 2014-11-18 NOTE — Interval H&P Note (Signed)
History and Physical Interval Note:  11/18/2014 10:33 PM  Jacqueline Underwood  has presented today for surgery, with the diagnosis of advanced preterm labor. Admitted at 5 cm and now progressed to 7 cm and are much lower in the pelvis. Presenting fetus is breech with 2 feet kicking me on exam and no ability to reduce them above the head.  The various methods of treatment have been discussed with the patient and family. After consideration of risks, benefits and other options for treatment, the patient has consented to  Procedure(s): CESAREAN SECTION (N/A) as a surgical intervention .  The patient's history has been reviewed, patient examined, no change in status, stable for surgery.  I have reviewed the patient's chart and labs.  Questions were answered to the patient's satisfaction.     [place]

## 2014-11-18 NOTE — Op Note (Signed)
Preoperative Diagnosis:  IUP @ [redacted]w[redacted]d, DC/DA twins, Advanced cervical dilation and breech presentation of first baby  Postoperative Diagnosis:  Same  Procedure: Primary low transverse cesarean section  Surgeon: Darron Doom, M.D.  Assistant: None  Findings: A - upper infant, Viable female infant, delayed cord clamping, spontaneous cry, vertex B - lower infant, breech, Viable female infant, delayed cord clamping, spontaneous cry  Estimated blood loss: 1583 cc  Complications: None known  Specimens: Placenta to labor and delivery  Reason for procedure: Briefly, the patient is a 24 y.o. E9M0768 [redacted]w[redacted]d who presents for preterm labor. She was given Magnesium Sulfate and BMZ, but labor continued despite best efforts. U/S reported A was vertex, but exam reveals feet in the vagina, which could not be reduced. Decision was made to proceed with abdominal delivery.  Procedure: Patient is a to the OR where spinal analgesia was administered. She was then placed in a supine position with left lateral tilt. She received 2 g of Ancef and SCDs were in place. A timeout was performed. She was prepped and draped in the usual sterile fashion. A Foley catheter was placed in the bladder. A knife was then used to make a Pfannenstiel incision. This incision was carried out to underlying fascia which was divided in the midline with the knife. The incision was extended laterally, bluntly.  The rectus was divided in the midline.  The peritoneal cavity was entered bluntly.  Alexis retractor was placed inside the incision.  A knife was used to make a low transverse incision on the uterus. This incision was carried down to the amniotic cavity was entered. Baby A, the upper fetus was in vertex position and was brought up out of the incision without difficulty. Delayed cord clamping x 1 minute. Cord was clamped x 2 and cut. Infant taken to waiting pediatrician.  Baby B, the lower fetus was delivered double footling breech. Delayed  cord clamping was done x 1 minute.  Cord clamped x 2 cut. Infant taking to waiting Pediatrician. Cord blood was obtained x 2. Placenta was delivered from the uterus.  Uterus was cleaned with dry lap pads. Uterine incision closed with 0 Vicryl suture in a locked running fashion. A second layer of 0 Vicryl in an imbricating fashion was used to achieve hemostasis. Alexis retractor was removed from the abdomen. Peritoneal closure was done with 0 Vicryl suture.  Fascia is closed with 0 Vicryl suture in a running fashion. Subcutaneous tissue infused with 30cc 0.25% Marcaine. Skin closed using 3-0 Vicryl on a Keith needle.  Steri strips applied, followed by pressure dressing.  All instrument, needle and lap counts were correct x 2.  Patient was awake and taken to PACU stable.  Infants to NICU, stable.   PRATT,TANYA S MD 11/18/2014 11:44 PM

## 2014-11-18 NOTE — MAU Note (Signed)
Pt stated she has not felt baby move much today and has been having ctx 5-10 min apart.

## 2014-11-18 NOTE — Telephone Encounter (Signed)
Pt c/o "long sharp cramps and back pain, brown mucus discharge when she wipes, +FM, no vaginal bleeding. Pt informed to push fluids, take tylenol if no improvement call our office back. If vaginal bleeding occurs or decrease fetal movement to call our office back. Pt verbalized understanding.

## 2014-11-19 DIAGNOSIS — Z3A30 30 weeks gestation of pregnancy: Secondary | ICD-10-CM | POA: Insufficient documentation

## 2014-11-19 LAB — CBC
HCT: 32.6 % — ABNORMAL LOW (ref 36.0–46.0)
HEMOGLOBIN: 11.1 g/dL — AB (ref 12.0–15.0)
MCH: 31 pg (ref 26.0–34.0)
MCHC: 34 g/dL (ref 30.0–36.0)
MCV: 91.1 fL (ref 78.0–100.0)
PLATELETS: 191 10*3/uL (ref 150–400)
RBC: 3.58 MIL/uL — ABNORMAL LOW (ref 3.87–5.11)
RDW: 12.6 % (ref 11.5–15.5)
WBC: 12.7 10*3/uL — ABNORMAL HIGH (ref 4.0–10.5)

## 2014-11-19 LAB — RPR: RPR Ser Ql: NONREACTIVE

## 2014-11-19 MED ORDER — OXYCODONE-ACETAMINOPHEN 5-325 MG PO TABS
2.0000 | ORAL_TABLET | ORAL | Status: DC | PRN
  Administered 2014-11-20 – 2014-11-21 (×3): 2 via ORAL
  Filled 2014-11-19 (×3): qty 2

## 2014-11-19 MED ORDER — SIMETHICONE 80 MG PO CHEW
80.0000 mg | CHEWABLE_TABLET | ORAL | Status: DC
  Administered 2014-11-19: 80 mg via ORAL
  Filled 2014-11-19 (×3): qty 1

## 2014-11-19 MED ORDER — ONDANSETRON HCL 4 MG/2ML IJ SOLN: 4.0000 mg | Freq: Three times a day (TID) | INTRAMUSCULAR | Status: DC | PRN

## 2014-11-19 MED ORDER — DEXTROSE IN LACTATED RINGERS 5 % IV SOLN
INTRAVENOUS | Status: DC
  Administered 2014-11-19: 06:00:00 via INTRAVENOUS

## 2014-11-19 MED ORDER — MEASLES, MUMPS & RUBELLA VAC ~~LOC~~ INJ
0.5000 mL | INJECTION | Freq: Once | SUBCUTANEOUS | Status: DC
  Filled 2014-11-19: qty 0.5

## 2014-11-19 MED ORDER — NALOXONE HCL 1 MG/ML IJ SOLN
1.0000 ug/kg/h | INTRAVENOUS | Status: DC | PRN
  Filled 2014-11-19: qty 2

## 2014-11-19 MED ORDER — ACETAMINOPHEN 325 MG PO TABS: 650.0000 mg | ORAL_TABLET | ORAL | Status: DC | PRN

## 2014-11-19 MED ORDER — OXYTOCIN 40 UNITS IN LACTATED RINGERS INFUSION - SIMPLE MED: 62.5000 mL/h | INTRAVENOUS | Status: AC

## 2014-11-19 MED ORDER — DIPHENHYDRAMINE HCL 50 MG/ML IJ SOLN: 12.5000 mg | INTRAMUSCULAR | Status: DC | PRN

## 2014-11-19 MED ORDER — OXYCODONE-ACETAMINOPHEN 5-325 MG PO TABS
1.0000 | ORAL_TABLET | ORAL | Status: DC | PRN
  Administered 2014-11-20: 1 via ORAL
  Filled 2014-11-19: qty 1

## 2014-11-19 MED ORDER — NALBUPHINE HCL 10 MG/ML IJ SOLN: 5.0000 mg | Freq: Once | INTRAMUSCULAR | Status: AC | PRN

## 2014-11-19 MED ORDER — KETOROLAC TROMETHAMINE 30 MG/ML IJ SOLN: 30.0000 mg | Freq: Four times a day (QID) | INTRAMUSCULAR | Status: AC | PRN

## 2014-11-19 MED ORDER — ZOLPIDEM TARTRATE 5 MG PO TABS: 5.0000 mg | ORAL_TABLET | Freq: Every evening | ORAL | Status: DC | PRN

## 2014-11-19 MED ORDER — TETANUS-DIPHTH-ACELL PERTUSSIS 5-2.5-18.5 LF-MCG/0.5 IM SUSP: 0.5000 mL | Freq: Once | INTRAMUSCULAR | Status: DC

## 2014-11-19 MED ORDER — IBUPROFEN 600 MG PO TABS
600.0000 mg | ORAL_TABLET | Freq: Four times a day (QID) | ORAL | Status: DC
  Administered 2014-11-19 – 2014-11-21 (×10): 600 mg via ORAL
  Filled 2014-11-19 (×10): qty 1

## 2014-11-19 MED ORDER — MENTHOL 3 MG MT LOZG
1.0000 | LOZENGE | OROMUCOSAL | Status: DC | PRN
Start: 2014-11-19 — End: 2014-11-21

## 2014-11-19 MED ORDER — NALBUPHINE HCL 10 MG/ML IJ SOLN: 5.0000 mg | INTRAMUSCULAR | Status: DC | PRN

## 2014-11-19 MED ORDER — MEPERIDINE HCL 25 MG/ML IJ SOLN: 6.2500 mg | INTRAMUSCULAR | Status: DC | PRN

## 2014-11-19 MED ORDER — SODIUM CHLORIDE 0.9 % IJ SOLN: 3.0000 mL | INTRAMUSCULAR | Status: DC | PRN

## 2014-11-19 MED ORDER — LORATADINE 10 MG PO TABS
10.0000 mg | ORAL_TABLET | Freq: Every day | ORAL | Status: DC
  Administered 2014-11-19 – 2014-11-20 (×2): 10 mg via ORAL
  Filled 2014-11-19 (×4): qty 1

## 2014-11-19 MED ORDER — SIMETHICONE 80 MG PO CHEW: 80.0000 mg | CHEWABLE_TABLET | ORAL | Status: DC | PRN

## 2014-11-19 MED ORDER — FENTANYL CITRATE (PF) 100 MCG/2ML IJ SOLN: 25.0000 ug | INTRAMUSCULAR | Status: DC | PRN

## 2014-11-19 MED ORDER — TETANUS-DIPHTH-ACELL PERTUSSIS 5-2.5-18.5 LF-MCG/0.5 IM SUSP
0.5000 mL | Freq: Once | INTRAMUSCULAR | Status: AC
  Administered 2014-11-20: 0.5 mL via INTRAMUSCULAR
  Filled 2014-11-19: qty 0.5

## 2014-11-19 MED ORDER — DIPHENHYDRAMINE HCL 25 MG PO CAPS: 25.0000 mg | ORAL_CAPSULE | Freq: Four times a day (QID) | ORAL | Status: DC | PRN

## 2014-11-19 MED ORDER — SCOPOLAMINE 1 MG/3DAYS TD PT72
1.0000 | MEDICATED_PATCH | Freq: Once | TRANSDERMAL | Status: DC
  Filled 2014-11-19: qty 1

## 2014-11-19 MED ORDER — SENNOSIDES-DOCUSATE SODIUM 8.6-50 MG PO TABS
2.0000 | ORAL_TABLET | ORAL | Status: DC
  Administered 2014-11-19 – 2014-11-20 (×3): 2 via ORAL
  Filled 2014-11-19 (×3): qty 2

## 2014-11-19 MED ORDER — NALOXONE HCL 0.4 MG/ML IJ SOLN: 0.4000 mg | INTRAMUSCULAR | Status: DC | PRN

## 2014-11-19 MED ORDER — DIPHENHYDRAMINE HCL 25 MG PO CAPS: 25.0000 mg | ORAL_CAPSULE | ORAL | Status: DC | PRN

## 2014-11-19 MED ORDER — PRENATAL MULTIVITAMIN CH
1.0000 | ORAL_TABLET | Freq: Every day | ORAL | Status: DC
  Administered 2014-11-19 – 2014-11-21 (×3): 1 via ORAL
  Filled 2014-11-19 (×3): qty 1

## 2014-11-19 MED ORDER — SIMETHICONE 80 MG PO CHEW
80.0000 mg | CHEWABLE_TABLET | Freq: Three times a day (TID) | ORAL | Status: DC
  Administered 2014-11-19 – 2014-11-21 (×8): 80 mg via ORAL
  Filled 2014-11-19 (×3): qty 1

## 2014-11-19 MED ORDER — DIBUCAINE 1 % RE OINT: 1.0000 "application " | TOPICAL_OINTMENT | RECTAL | Status: DC | PRN

## 2014-11-19 MED ORDER — IBUPROFEN 600 MG PO TABS: 600.0000 mg | ORAL_TABLET | Freq: Four times a day (QID) | ORAL | Status: DC | PRN

## 2014-11-19 MED ORDER — LANOLIN HYDROUS EX OINT: 1.0000 "application " | TOPICAL_OINTMENT | CUTANEOUS | Status: DC | PRN

## 2014-11-19 MED ORDER — WITCH HAZEL-GLYCERIN EX PADS: 1.0000 "application " | MEDICATED_PAD | CUTANEOUS | Status: DC | PRN

## 2014-11-19 NOTE — Addendum Note (Signed)
Addendum  created 11/19/14 1244 by Raenette Rover, CRNA   Modules edited: Notes Section   Notes Section:  File: [id number]

## 2014-11-19 NOTE — Progress Notes (Signed)
Post Partum Day 1, s/p PLTCS for breech twins/PTL  Subjective: no complaints, has not been up yet. Foley still in. tolerating PO, small lochia, plans to breastfeed (pumping right now)  Objective: Blood pressure 105/55, pulse 52, temperature 98.1 F (36.7 C), temperature source Oral, resp. rate 20, height 5' (1.524 m), weight 103.874 kg (229 lb), last menstrual period 04/12/2014, SpO2 95 %, unknown if currently breastfeeding.  Physical Exam:  General: alert, cooperative and no distress Lochia:normal flow Chest: CTAB Heart: RRR no m/r/g Abdomen: +BS, soft, nontender, dsg dry/intact Uterine Fundus: firm DVT Evaluation: No evidence of DVT seen on physical exam. Extremities: trace edema   Recent Labs  11/18/14 1930 11/19/14 0532  HGB 10.5* 11.1*  HCT 30.7* 32.6*    Assessment/Plan: POD #1, doing well Plan 3 day stay Lactation consult   LOS: 1 day   CRESENZO-DISHMAN,Danira Nylander 11/19/2014, 6:45 AM

## 2014-11-19 NOTE — Anesthesia Postprocedure Evaluation (Signed)
  Anesthesia Post-op Note  Patient: Jacqueline Underwood  Procedure(s) Performed: Procedure(s): CESAREAN SECTION MULTI-GESTATIONAL  Patient Location: Women's Unit  Anesthesia Type:Spinal  Level of Consciousness: awake, alert , oriented and patient cooperative  Airway and Oxygen Therapy: Patient Spontanous Breathing  Post-op Pain: none  Post-op Assessment: Post-op Vital signs reviewed, Patient's Cardiovascular Status Stable, Respiratory Function Stable, Patent Airway, No headache, No backache and Patient able to bend at knees  Post-op Vital Signs: Reviewed and stable  Last Vitals:  Filed Vitals:   11/19/14 1000  BP: 86/43  Pulse: 65  Temp: 36.4 C  Resp: 20    Complications: No apparent anesthesia complications

## 2014-11-19 NOTE — Plan of Care (Signed)
Problem: Phase I Progression Outcomes Goal: OOB as tolerated unless otherwise ordered Outcome: Progressing Ambulated well in hallway, standby assist.

## 2014-11-19 NOTE — Clinical Social Work Maternal (Signed)
CLINICAL SOCIAL WORK MATERNAL/CHILD NOTE  Patient Details  Name: Jacqueline Underwood MRN: [id number] Date of Birth: 05/19/1991  Date:  2015-05-08  Clinical Social Worker Initiating Note:  Magan Winnett E. Brigitte Pulse, [place] Date/ Time Initiated:  11/19/14/1333     Child's Name:  Boy A: Tommi Rumps, Boy B: Raihan   Legal Guardian:   (Parents: Corinda Gubler and Livingston Diones)   Need for Interpreter:  None   Date of Referral:        Reason for Referral:   (No referral-NICU admission)   Referral Source:      Address:  [address], [place], [place] [address]  Phone number:  [phone]   Household Members:  Significant Other, Minor Children (Couple has one other child: Cameron-01/15/13)   Natural Supports (not living in the home):  Extended Family, Friends, Immediate Family   Professional Supports:     Employment:     Type of Work:  (MOB was working at [employer], but is no longer.  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  FOB works for [employer]   Education:      Museum/gallery curator Resources:  [employer]   Other Resources:  [facility]   Cultural/Religious Considerations Which May Impact Care: None stated  Strengths:  Ability to meet basic needs , Compliance with medical plan , Home prepared for child , Pediatrician chosen , Understanding of illness (Parents state they have begun to gather baby supplies.  They do not yet have baby beds.  Pediatric follow up will be at [facility] in [place].)   Risk Factors/Current Problems:  None   Cognitive State:  Alert , Linear Thinking , Insightful    Mood/Affect:  Euthymic , Interested , Comfortable , Calm , Relaxed    CSW Assessment: CSW met with parents in MOB's third floor room/319 to introduce myself, complete assessment due to admission of twins to NICU at 30.4 weeks, and to offer support.  Parents were very friendly and receptive to CSW intervention.  MOB was eating, but stated that CSW could talk while she ate.  Parents report  doing well.  MOB states she felt "terrified" yesterday when she went into preterm labor.  They are relieved at how well babies are doing.  They were easy to engage and open to processing their feelings regarding their sons' hospitalization.  They report no questions at this time and state that everyone in the NICU has been very information.  MOB reports having had numerous questions when the twins were first born.   Parents live together and have a son, Lysbeth Galas, who will turn two in August.  They report having a large support group of family and friends who live locally.  They also report that they have already begun to gather supplies for the babies.  MOB commented that they have not gotten a crib yet, since they thought they had more time.  CSW informed them that they still have time to get baby items and informed them of the recommendation to put babies to bed in their own cribs, therefore, needing two beds.  CSW offered a pack and play from [employer] if they find getting an additional bed to be a hardship.  Parents seemed appreciative of the information and will let CSW know of their needs.  CSW also informed parents of gas cards since they will be [place].  They seem very interested in this resource.   MOB denies any  history of PPD after her first delivery.  Parents were attentive and allowed CSW to provide education on PPD signs and symptoms to watch for.  CSW explained ongoing support services offered by NICU CSW and asked that they call any time.  CSW provided contact information.  CSW completed chart review of MOB's medical record.  CSW has no social concerns at this time.   CSW Plan/Description:  Patient/Family Education , Psychosocial Support and Ongoing Assessment of Needs    Alphonzo Cruise, [place] Feb 09, 2015, 1:40 PM

## 2014-11-19 NOTE — Lactation Note (Signed)
This note was copied from the chart of [employer]. Lactation Consultation Note  Patient Name: Jacqueline Underwood Today's Date: 11/19/2014 Reason for consult: Initial assessment;NICU baby;Multiple gestation NICU twin boys 100 hours old, [redacted]w[redacted]d CGA. Mom is pumping and getting colostrum to flow when this LC entered room. Mom states that she nurse her first child for 4-5 months without any issues. Mom reports that she did not pump with first child. Mom states she has already pumped 3 times. Enc mom to sleep 5-6 hours at night and pump 8 times a day for 15 minutes, pumping more often in the morning. Mom given small bottles with labels and instructions. Enc mom to hand express after pumping. Mom given NICU booklet with review, and LC brochure. Mom aware of OP/BFSG, community resources, and [facility] phone line assistance after D/C. Mom states she is active with [facility] and may call about a DEBP today. Mom aware of NICU pumping rooms.   Maternal Data Has patient been taught Hand Expression?: Yes (Per mom.) Does the patient have breastfeeding experience prior to this delivery?: Yes  Feeding    LATCH Score/Interventions                      Lactation Tools Discussed/Used WIC Program: Yes Linna Hoff.) Pump Review: Setup, frequency, and cleaning;Milk Storage Initiated by:: Bedside nurse.  Date initiated:: 11/18/14   Consult Status Consult Status: Follow-up Date: 11/20/14 Follow-up type: In-patient    Inocente Salles 11/19/2014, 1:08 PM

## 2014-11-19 NOTE — Anesthesia Postprocedure Evaluation (Signed)
  Anesthesia Post-op Note  Patient: Facilities manager  Procedure(s) Performed: Procedure(s): CESAREAN SECTION (N/A)  Patient Location: PACU  Anesthesia Type:Spinal  Level of Consciousness: awake, alert  and oriented  Airway and Oxygen Therapy: Patient Spontanous Breathing  Post-op Pain: none  Post-op Assessment: Post-op Vital signs reviewed, Patient's Cardiovascular Status Stable, Respiratory Function Stable, Patent Airway, No signs of Nausea or vomiting, Pain level controlled, No headache, No backache and Spinal receding LLE Motor Response: Purposeful movement LLE Sensation: Decreased, Numbness RLE Motor Response: Purposeful movement RLE Sensation: Decreased, Numbness      Post-op Vital Signs: Reviewed and stable  Last Vitals:  Filed Vitals:   11/19/14 0030  BP: 102/50  Pulse: 80  Temp:   Resp: 25    Complications: No apparent anesthesia complications

## 2014-11-20 ENCOUNTER — Encounter (HOSPITAL_COMMUNITY): Payer: Self-pay | Admitting: Family Medicine

## 2014-11-20 NOTE — Progress Notes (Signed)
Subjective: Postpartum Day 2: Cesarean Delivery Patient reports incisional pain, tolerating PO and no problems voiding.    Objective: Vital signs in last 24 hours: Temp:  [97.6 F (36.4 C)-98.4 F (36.9 C)] 98.4 F (36.9 C) (06/25 0548) Pulse Rate:  [64-94] 94 (06/25 0548) Resp:  [18-20] 18 (06/25 0548) BP: (86-110)/(43-55) 100/55 mmHg (06/25 0548) SpO2:  [98 %-100 %] 100 % (06/25 0548)  Physical Exam:  General: alert, cooperative and no distress Lochia: appropriate Uterine Fundus: firm Incision: healing well, no significant drainage, dressing dry DVT Evaluation: No evidence of DVT seen on physical exam.   Recent Labs  11/18/14 1930 11/19/14 0532  HGB 10.5* 11.1*  HCT 30.7* 32.6*    Assessment/Plan: Status post Cesarean section. Doing well postoperatively.  Continue current care Anticipate discharge tomoroow.  Jacqueline Underwood 11/20/2014, 8:05 AM

## 2014-11-20 NOTE — Lactation Note (Signed)
This note was copied from the chart of [employer]. Lactation Consultation Note  Patient Name: Jacqueline Underwood Date: 11/20/2014   Visited with Mom.  Babies doing well in the NICU- 35 weeks at [redacted] weeks gestation.  Mom pumping, and encouraged her to aim for 8 times in 24 hrs. Reminded her of breast massage, and manual breast expression along with double pumping. Talked about pumping after discharge.  Mom to call her insurance company about providing a DEBP for discharge.  Mom aware of our 2 weeks rental available.  To follow up in am.  To ask for help prn.      Jacqueline Underwood 11/20/2014, 11:13 AM

## 2014-11-21 MED ORDER — OXYCODONE-ACETAMINOPHEN 5-325 MG PO TABS: 1.0000 | ORAL_TABLET | Freq: Four times a day (QID) | ORAL | Status: DC | PRN

## 2014-11-21 MED ORDER — IBUPROFEN 600 MG PO TABS: 600.0000 mg | ORAL_TABLET | Freq: Four times a day (QID) | ORAL | Status: DC | PRN

## 2014-11-21 NOTE — Discharge Instructions (Signed)

## 2014-11-21 NOTE — Discharge Summary (Signed)
Obstetric Discharge Summary Reason for Admission: onset of labor Prenatal Procedures: ultrasound Intrapartum Procedures: cesarean: low cervical, transverse Postpartum Procedures: none Complications-Operative and Postpartum: none HEMOGLOBIN  Date Value Ref Range Status  11/19/2014 11.1* 12.0 - 15.0 g/dL Final  05/30/2012 12.1 g/dL Final   HCT  Date Value Ref Range Status  11/19/2014 32.6* 36.0 - 46.0 % Final  05/30/2012 35 % Final   HEMATOCRIT  Date Value Ref Range Status  11/03/2014 32.3* 34.0 - 46.6 % Final    Expand All Collapse All   Preoperative Diagnosis: IUP @ [redacted]w[redacted]d, DC/DA twins, Advanced cervical dilation and breech presentation of first baby  Postoperative Diagnosis: Same  Procedure: Primary low transverse cesarean section  Surgeon: Jacqueline Underwood, M.D.  Assistant: None  Findings: A - upper infant, Viable female infant, delayed cord clamping, spontaneous cry, vertex B - lower infant, breech, Viable female infant, delayed cord clamping, spontaneous cry  Estimated blood loss: 4235 cc  Complications: None known  Specimens: Placenta to labor and delivery  Reason for procedure: Briefly, the patient is a 24 y.o. [id number] [redacted]w[redacted]d who presents for preterm labor. She was given Magnesium Sulfate and BMZ, but labor continued despite best efforts. U/S reported A was vertex, but exam reveals feet in the vagina, which could not be reduced. Decision was made to proceed with abdominal delivery.  Procedure: Patient is a to the OR where spinal analgesia was administered. She was then placed in a supine position with left lateral tilt. She received 2 g of Ancef and SCDs were in place. A timeout was performed. She was prepped and draped in the usual sterile fashion. A Foley catheter was placed in the bladder. A knife was then used to make a Pfannenstiel incision. This incision was carried out to underlying fascia which was divided in the midline with the knife. The incision was extended  laterally, bluntly. The rectus was divided in the midline. The peritoneal cavity was entered bluntly. Alexis retractor was placed inside the incision. A knife was used to make a low transverse incision on the uterus. This incision was carried down to the amniotic cavity was entered. Baby A, the upper fetus was in vertex position and was brought up out of the incision without difficulty. Delayed cord clamping x 1 minute. Cord was clamped x 2 and cut. Infant taken to waiting pediatrician. Baby B, the lower fetus was delivered double footling breech. Delayed cord clamping was done x 1 minute. Cord clamped x 2 cut. Infant taking to waiting Pediatrician. Cord blood was obtained x 2. Placenta was delivered from the uterus. Uterus was cleaned with dry lap pads. Uterine incision closed with 0 Vicryl suture in a locked running fashion. A second layer of 0 Vicryl in an imbricating fashion was used to achieve hemostasis. Alexis retractor was removed from the abdomen. Peritoneal closure was done with 0 Vicryl suture. Fascia is closed with 0 Vicryl suture in a running fashion. Subcutaneous tissue infused with 30cc 0.25% Marcaine. Skin closed using 3-0 Vicryl on a Keith needle. Steri strips applied, followed by pressure dressing. All instrument, needle and lap counts were correct x 2. Patient was awake and taken to PACU stable. Infants to NICU, stable.   PRATT,Jacqueline Underwood 11/18/2014 11:44 PM      Physical Exam:  General: alert, cooperative and no distress Lochia: appropriate Uterine Fundus: firm Incision: no significant drainage DVT Evaluation: No evidence of DVT seen on physical exam.  Discharge Diagnoses: premature delivery of twins at 30 weeks  Discharge  Information: Date: 11/21/2014 Activity: pelvic rest and no heavy lifting Diet: routine Medications: Ibuprofen and Percocet Condition: improved Instructions: refer to practice specific booklet Discharge to: home Follow-up Information    Follow  up with [facility] In 6 weeks.   Contact information:   [place] [id number] [phone]      Newborn Data:   Jacqueline Underwood, Start [[id number]]  Live born female  Birth Weight: 3 lb 9.1 oz (1619 g) APGAR: 8, 9   Jacqueline Underwood, Jacqueline Underwood [[id number]]  Live born female  Birth Weight: 4 lb 1.6 oz (1860 g) APGAR: 7, 9  NICU  Jacqueline Underwood 11/21/2014, 7:28 AM

## 2014-11-21 NOTE — Progress Notes (Signed)

## 2014-11-21 NOTE — Lactation Note (Signed)
This note was copied from the chart of [employer]. Lactation Consultation Note  Patient Name: Jacqueline Underwood Today's Date: 11/21/2014   Visited with Mom on day of her discharge, babies 67 hrs old.  Pumping 2 oz breast milk now.  Mom very encouraged and in good spirits.  Instructed Mom on taking her pump parts home, to pump in the NICU when she is visiting.  Reminded her of OP Lactation support available to and encouraged her to call prn.  No questions at this point.  To call prn for assistance.   Broadus John 11/21/2014, 10:55 AM

## 2014-11-22 LAB — US OB FOLLOW UP

## 2014-12-02 ENCOUNTER — Other Ambulatory Visit: Payer: Medicaid Other

## 2014-12-02 ENCOUNTER — Encounter: Payer: Medicaid Other | Admitting: Obstetrics & Gynecology

## 2015-01-03 ENCOUNTER — Ambulatory Visit: Payer: Medicaid Other | Admitting: Adult Health

## 2015-01-04 ENCOUNTER — Encounter: Payer: Self-pay | Admitting: Women's Health

## 2015-01-04 ENCOUNTER — Ambulatory Visit (INDEPENDENT_AMBULATORY_CARE_PROVIDER_SITE_OTHER): Payer: Medicaid Other | Admitting: Women's Health

## 2015-01-04 DIAGNOSIS — Z98891 History of uterine scar from previous surgery: Secondary | ICD-10-CM

## 2015-01-04 HISTORY — DX: History of uterine scar from previous surgery: Z98.891

## 2015-01-04 NOTE — Progress Notes (Signed)
Patient ID: Jacqueline Underwood, female   DOB: Dec 03, 1990, 24 y.o.   MRN: [id number] Subjective:    Jacqueline Underwood is a 24 y.o. [phone] African American female who presents for a postpartum visit. She is 6 weeks postpartum following a primary cesarean section, low transverse incision at 30.4 gestational weeks due to active labor and breech/breech presentation. Anesthesia: spinal. I have fully reviewed the prenatal and intrapartum course. Postpartum course has been uncomplicated. Baby's course has been complicated by 6wk nicu stay. Baby is feeding by bottle. Bleeding no bleeding. Bowel function is normal. Bladder function is normal. Patient is not sexually active. Last sexual activity: prior to birth of babies. Contraception method is abstinence and wants nexplanon. Postpartum depression screening: negative. Score 0.  Last pap 06/23/14 and was neg.  The following portions of the patient's history were reviewed and updated as appropriate: allergies, current medications, past medical history, past surgical history and problem list.  Review of Systems Pertinent items are noted in HPI.   Filed Vitals:   01/04/15 1341  BP: 98/64  Pulse: 76  Weight: 204 lb (92.534 kg)   Patient's last menstrual period was 12/29/2014 (exact date).  Objective:   General:  alert, cooperative and no distress   Breasts:  deferred, no complaints  Lungs: clear to auscultation bilaterally  Heart:  regular rate and rhythm  Abdomen: soft, nontender   Vulva: normal  Vagina: normal vagina  Cervix:  closed  Corpus: Well-involuted  Adnexa:  Non-palpable  Rectal Exam: No hemorrhoids        Assessment:   Postpartum exam 6 wks s/p PLTCS at 30.4wks for breech/breech twins in active labor Bottlefeeding Depression screening Contraception counseling   Plan:   Contraception: abstinence until nexplanon insertion Follow up in: 3 weeks for nexplanon or earlier if needed, order today  Tawnya Crook CNM,  WHNP-BC 01/04/2015 2:04 PM

## 2015-01-04 NOTE — Patient Instructions (Signed)
NO SEX UNTIL AFTER YOU GET YOUR BIRTH CONTROL!!! Etonogestrel implant What is this medicine? ETONOGESTREL (et oh noe JES trel) is a contraceptive (birth control) device. It is used to prevent pregnancy. It can be used for up to 3 years. This medicine may be used for other purposes; ask your health care provider or pharmacist if you have questions. COMMON BRAND NAME(S): Implanon, Nexplanon What should I tell my health care provider before I take this medicine? They need to know if you have any of these conditions: -abnormal vaginal bleeding -blood vessel disease or blood clots -cancer of the breast, cervix, or liver -depression -diabetes -gallbladder disease -headaches -heart disease or recent heart attack -high blood pressure -high cholesterol -kidney disease -liver disease -renal disease -seizures -tobacco smoker -an unusual or allergic reaction to etonogestrel, other hormones, anesthetics or antiseptics, medicines, foods, dyes, or preservatives -pregnant or trying to get pregnant -breast-feeding How should I use this medicine? This device is inserted just under the skin on the inner side of your upper arm by a health care professional. Talk to your pediatrician regarding the use of this medicine in children. Special care may be needed. Overdosage: If you think you've taken too much of this medicine contact a poison control center or emergency room at once. Overdosage: If you think you have taken too much of this medicine contact a poison control center or emergency room at once. NOTE: This medicine is only for you. Do not share this medicine with others. What if I miss a dose? This does not apply. What may interact with this medicine? Do not take this medicine with any of the following medications: -amprenavir -bosentan -fosamprenavir This medicine may also interact with the following medications: -barbiturate medicines for inducing sleep or treating seizures -certain  medicines for fungal infections like ketoconazole and itraconazole -griseofulvin -medicines to treat seizures like carbamazepine, felbamate, oxcarbazepine, phenytoin, topiramate -modafinil -phenylbutazone -rifampin -some medicines to treat HIV infection like atazanavir, indinavir, lopinavir, nelfinavir, tipranavir, ritonavir -St. John's wort This list may not describe all possible interactions. Give your health care provider a list of all the medicines, herbs, non-prescription drugs, or dietary supplements you use. Also tell them if you smoke, drink alcohol, or use illegal drugs. Some items may interact with your medicine. What should I watch for while using this medicine? This product does not protect you against HIV infection (AIDS) or other sexually transmitted diseases. You should be able to feel the implant by pressing your fingertips over the skin where it was inserted. Tell your doctor if you cannot feel the implant. What side effects may I notice from receiving this medicine? Side effects that you should report to your doctor or health care professional as soon as possible: -allergic reactions like skin rash, itching or hives, swelling of the face, lips, or tongue -breast lumps -changes in vision -confusion, trouble speaking or understanding -dark urine -depressed mood -general ill feeling or flu-like symptoms -light-colored stools -loss of appetite, nausea -right upper belly pain -severe headaches -severe pain, swelling, or tenderness in the abdomen -shortness of breath, chest pain, swelling in a leg -signs of pregnancy -sudden numbness or weakness of the face, arm or leg -trouble walking, dizziness, loss of balance or coordination -unusual vaginal bleeding, discharge -unusually weak or tired -yellowing of the eyes or skin Side effects that usually do not require medical attention (Report these to your doctor or health care professional if they continue or are  bothersome.): -acne -breast pain -changes in weight -cough -fever  or chills -headache -irregular menstrual bleeding -itching, burning, and vaginal discharge -pain or difficulty passing urine -sore throat This list may not describe all possible side effects. Call your doctor for medical advice about side effects. You may report side effects to FDA at 1-800-FDA-1088. Where should I keep my medicine? This drug is given in a hospital or clinic and will not be stored at home. NOTE: This sheet is a summary. It may not cover all possible information. If you have questions about this medicine, talk to your doctor, pharmacist, or health care provider.  2015, Elsevier/Gold Standard. (2011-11-19 15:37:45)

## 2015-01-25 ENCOUNTER — Encounter: Payer: Self-pay | Admitting: Women's Health

## 2015-01-25 ENCOUNTER — Ambulatory Visit (INDEPENDENT_AMBULATORY_CARE_PROVIDER_SITE_OTHER): Payer: Medicaid Other | Admitting: Women's Health

## 2015-01-25 VITALS — BP 102/62 | HR 76 | Wt 208.0 lb

## 2015-01-25 DIAGNOSIS — Z3202 Encounter for pregnancy test, result negative: Secondary | ICD-10-CM | POA: Diagnosis not present

## 2015-01-25 DIAGNOSIS — Z01419 Encounter for gynecological examination (general) (routine) without abnormal findings: Secondary | ICD-10-CM

## 2015-01-25 DIAGNOSIS — Z302 Encounter for sterilization: Secondary | ICD-10-CM | POA: Insufficient documentation

## 2015-01-25 DIAGNOSIS — Z30017 Encounter for initial prescription of implantable subdermal contraceptive: Secondary | ICD-10-CM

## 2015-01-25 DIAGNOSIS — Z3049 Encounter for surveillance of other contraceptives: Secondary | ICD-10-CM

## 2015-01-25 HISTORY — DX: Encounter for sterilization: Z30.2

## 2015-01-25 LAB — POCT URINE PREGNANCY: PREG TEST UR: NEGATIVE

## 2015-01-25 NOTE — Progress Notes (Signed)
Patient ID: Jacqueline Underwood, female   DOB: 07/08/1990, 24 y.o.   MRN: [id number] Jacqueline Underwood is a 24 y.o. year old African American female here for Nexplanon insertion.  Patient's last menstrual period was 12/29/2014 (exact date)., last sexual intercourse was prior to birth of twin babies, and her pregnancy test today was negative.  Risks/benefits/side effects of Nexplanon have been discussed and her questions have been answered.  Specifically, a failure rate of 05/998 has been reported, with an increased failure rate if pt takes [place] and/or antiseizure medicaitons.  Jacqueline Underwood is aware of the common side effect of irregular bleeding, which the incidence of decreases over time.  BP 102/62 mmHg  Pulse 76  Wt 208 lb (94.348 kg)  LMP 12/29/2014 (Exact Date)  Breastfeeding? No  Results for orders placed or performed in visit on 01/25/15 (from the past 24 hour(s))  POCT urine pregnancy   Collection Time: 01/25/15  3:26 PM  Result Value Ref Range   Preg Test, Ur Negative Negative     She is left-handed, so her right arm, approximately 4 inches proximal from the elbow, was cleansed with alcohol and anesthetized with 2cc of 2% Lidocaine.  The area was cleansed again with betadine and the Nexplanon was inserted per manufacturer's recommendations without difficulty.  A steri-strip and pressure bandage were applied.  Pt was instructed to keep the area clean and dry, remove pressure bandage in 24 hours, and keep insertion site covered with the steri-strip for 3-5 days.  Back up contraception was recommended for 2 weeks.  She was given a card indicating date Nexplanon was inserted and date it needs to be removed. Follow-up PRN problems.  Tawnya Crook CNM, [facility] 01/25/2015 3:52 PM

## 2015-01-25 NOTE — Patient Instructions (Signed)
Keep the area clean and dry.  You can remove the big bandage in 24 hours, and the small steri-strip bandage in 3-5 days.  A back up method, such as condoms, should be used for two weeks. You may have irregular vaginal bleeding for the first 6 months after the Nexplanon is placed, then the bleeding usually lightens and it is possible that you may not have any periods.  If you have any concerns, please give [place] a call.    Etonogestrel implant What is this medicine? ETONOGESTREL (et oh noe JES trel) is a contraceptive (birth control) device. It is used to prevent pregnancy. It can be used for up to 3 years. This medicine may be used for other purposes; ask your health care provider or pharmacist if you have questions. COMMON BRAND NAME(S): Implanon, Nexplanon What should I tell my health care provider before I take this medicine? They need to know if you have any of these conditions: -abnormal vaginal bleeding -blood vessel disease or blood clots -cancer of the breast, cervix, or liver -depression -diabetes -gallbladder disease -headaches -heart disease or recent heart attack -high blood pressure -high cholesterol -kidney disease -liver disease -renal disease -seizures -tobacco smoker -an unusual or allergic reaction to etonogestrel, other hormones, anesthetics or antiseptics, medicines, foods, dyes, or preservatives -pregnant or trying to get pregnant -breast-feeding How should I use this medicine? This device is inserted just under the skin on the inner side of your upper arm by a health care professional. Talk to your pediatrician regarding the use of this medicine in children. Special care may be needed. Overdosage: If you think you've taken too much of this medicine contact a poison control center or emergency room at once. Overdosage: If you think you have taken too much of this medicine contact a poison control center or emergency room at once. NOTE: This medicine is only for you.  Do not share this medicine with others. What if I miss a dose? This does not apply. What may interact with this medicine? Do not take this medicine with any of the following medications: -amprenavir -bosentan -fosamprenavir This medicine may also interact with the following medications: -barbiturate medicines for inducing sleep or treating seizures -certain medicines for fungal infections like ketoconazole and itraconazole -griseofulvin -medicines to treat seizures like carbamazepine, felbamate, oxcarbazepine, phenytoin, topiramate -modafinil -phenylbutazone -rifampin -some medicines to treat HIV infection like atazanavir, indinavir, lopinavir, nelfinavir, tipranavir, ritonavir -St. John's wort This list may not describe all possible interactions. Give your health care provider a list of all the medicines, herbs, non-prescription drugs, or dietary supplements you use. Also tell them if you smoke, drink alcohol, or use illegal drugs. Some items may interact with your medicine. What should I watch for while using this medicine? This product does not protect you against HIV infection (AIDS) or other sexually transmitted diseases. You should be able to feel the implant by pressing your fingertips over the skin where it was inserted. Tell your doctor if you cannot feel the implant. What side effects may I notice from receiving this medicine? Side effects that you should report to your doctor or health care professional as soon as possible: -allergic reactions like skin rash, itching or hives, swelling of the face, lips, or tongue -breast lumps -changes in vision -confusion, trouble speaking or understanding -dark urine -depressed mood -general ill feeling or flu-like symptoms -light-colored stools -loss of appetite, nausea -right upper belly pain -severe headaches -severe pain, swelling, or tenderness in the abdomen -shortness of  breath, chest pain, swelling in a leg -signs of  pregnancy -sudden numbness or weakness of the face, arm or leg -trouble walking, dizziness, loss of balance or coordination -unusual vaginal bleeding, discharge -unusually weak or tired -yellowing of the eyes or skin Side effects that usually do not require medical attention (Report these to your doctor or health care professional if they continue or are bothersome.): -acne -breast pain -changes in weight -cough -fever or chills -headache -irregular menstrual bleeding -itching, burning, and vaginal discharge -pain or difficulty passing urine -sore throat This list may not describe all possible side effects. Call your doctor for medical advice about side effects. You may report side effects to FDA at 1-800-FDA-1088. Where should I keep my medicine? This drug is given in a hospital or clinic and will not be stored at home. NOTE: This sheet is a summary. It may not cover all possible information. If you have questions about this medicine, talk to your doctor, pharmacist, or health care provider.  2015, Elsevier/Gold Standard. (2011-11-19 15:37:45)

## 2015-03-23 ENCOUNTER — Telehealth: Payer: Self-pay | Admitting: *Deleted

## 2015-03-23 MED ORDER — MEGESTROL ACETATE 40 MG PO TABS: ORAL_TABLET | ORAL | Status: DC

## 2015-03-23 NOTE — Telephone Encounter (Signed)
Pt states had nexplanon since 12/2014 having irregular heavy vaginal bleeding for over 1 month. Pt requesting medication to stop irregular vaginal bleeding with nexplanon.

## 2015-12-16 ENCOUNTER — Telehealth: Payer: Self-pay | Admitting: Women's Health

## 2015-12-16 NOTE — Telephone Encounter (Signed)
Pt c/o a BTB with Nexplanon, has not been seen since 2016. Pt given an appt for evaluation.

## 2015-12-21 ENCOUNTER — Encounter: Payer: Self-pay | Admitting: Women's Health

## 2015-12-21 ENCOUNTER — Telehealth: Payer: Self-pay | Admitting: Women's Health

## 2015-12-21 ENCOUNTER — Ambulatory Visit (INDEPENDENT_AMBULATORY_CARE_PROVIDER_SITE_OTHER): Payer: Medicaid Other | Admitting: Women's Health

## 2015-12-21 VITALS — BP 100/58 | HR 72 | Wt 207.0 lb

## 2015-12-21 DIAGNOSIS — Z975 Presence of (intrauterine) contraceptive device: Secondary | ICD-10-CM | POA: Diagnosis not present

## 2015-12-21 DIAGNOSIS — N898 Other specified noninflammatory disorders of vagina: Secondary | ICD-10-CM

## 2015-12-21 DIAGNOSIS — A499 Bacterial infection, unspecified: Secondary | ICD-10-CM | POA: Diagnosis not present

## 2015-12-21 DIAGNOSIS — B9689 Other specified bacterial agents as the cause of diseases classified elsewhere: Secondary | ICD-10-CM

## 2015-12-21 DIAGNOSIS — N921 Excessive and frequent menstruation with irregular cycle: Secondary | ICD-10-CM

## 2015-12-21 DIAGNOSIS — N760 Acute vaginitis: Secondary | ICD-10-CM

## 2015-12-21 DIAGNOSIS — N939 Abnormal uterine and vaginal bleeding, unspecified: Secondary | ICD-10-CM

## 2015-12-21 LAB — POCT WET PREP (WET MOUNT): Clue Cells Wet Prep Whiff POC: POSITIVE

## 2015-12-21 MED ORDER — MEGESTROL ACETATE 40 MG PO TABS: ORAL_TABLET | ORAL | 1 refills | Status: DC

## 2015-12-21 MED ORDER — METRONIDAZOLE 500 MG PO TABS: 500.0000 mg | ORAL_TABLET | Freq: Two times a day (BID) | ORAL | 0 refills | Status: DC

## 2015-12-21 NOTE — Patient Instructions (Signed)

## 2015-12-21 NOTE — Addendum Note (Signed)
Addended by: Roma Schanz on: 12/21/2015 05:20 PM   Modules accepted: Orders

## 2015-12-21 NOTE — Progress Notes (Signed)
   [facility] Visit  Patient name: Jacqueline Underwood MRN [id number]  Date of birth: 1990/11/29  CC & HPI:  Jacqueline Underwood is a 25 y.o. [phone] African American female presenting today for report of BTB on Nexplanon. Nexplanon was placed 01/25/15, was rx'd megace sometime after d/t BTB. States son threw away her rx. Bleeds about twice/mth for few days each time, flow like period. Has had some abnormal d/c, no itching/odor/irritation.  No LMP recorded. Patient has had an implant. The current method of family planning is nexplanon. Last pap Jan 2016, normal  Pertinent History Reviewed:  Medical & Surgical Hx:   Past medical, surgical, family, and social history reviewed in electronic medical record Medications: Reviewed & Updated - see associated section Allergies: Reviewed in electronic medical record  Objective Findings:  Vitals: BP (!) 100/58 (BP Location: Right Arm, Patient Position: Sitting)   Pulse 72   Wt 207 lb (93.9 kg)   BMI 40.43 kg/m  Body mass index is 40.43 kg/m.  Physical Examination: General appearance - alert, well appearing, and in no distress Pelvic - spec exam: cx visually clear, mod amt thin white malodorous d/c  Results for orders placed or performed in visit on 12/21/15 (from the past 24 hour(s))  POCT Wet Prep [facility] [place])   Collection Time: 12/21/15  2:03 PM  Result Value Ref Range   Source Wet Prep POC vaginal    WBC, Wet Prep HPF POC few    Bacteria Wet Prep HPF POC None None, Few, Too numerous to count   BACTERIA WET PREP MORPHOLOGY POC     Clue Cells Wet Prep HPF POC Moderate (A) None, Too numerous to count   Clue Cells Wet Prep Whiff POC Positive Whiff    Yeast Wet Prep HPF POC None    KOH Wet Prep POC     Trichomonas Wet Prep HPF POC none      Assessment & Plan:  A:   BV  BTB on Nexplanon  P:  GC/CT from urine today  Rx metronidazole 500mg  BID x 7d for BV, no sex or etoh while taking   Metronidazole may help BTB, if not- refilled  megace rx  Return in about 1 month (around 01/21/2016) for Pap & physical.  Tawnya Crook CNM, [facility] 12/21/2015 2:03 PM

## 2015-12-22 NOTE — Telephone Encounter (Signed)
Pt informed Flagyl e-scribed to pharmacy.

## 2015-12-23 LAB — GC/CHLAMYDIA PROBE AMP
CHLAMYDIA, DNA PROBE: NEGATIVE
Neisseria gonorrhoeae by PCR: NEGATIVE

## 2016-01-18 ENCOUNTER — Other Ambulatory Visit: Payer: Medicaid Other | Admitting: Women's Health

## 2016-02-01 ENCOUNTER — Ambulatory Visit (INDEPENDENT_AMBULATORY_CARE_PROVIDER_SITE_OTHER): Payer: Medicaid Other | Admitting: Women's Health

## 2016-02-01 ENCOUNTER — Encounter: Payer: Self-pay | Admitting: Women's Health

## 2016-02-01 VITALS — BP 108/70 | HR 72 | Ht 61.5 in | Wt 205.0 lb

## 2016-02-01 DIAGNOSIS — A499 Bacterial infection, unspecified: Secondary | ICD-10-CM

## 2016-02-01 DIAGNOSIS — Z01419 Encounter for gynecological examination (general) (routine) without abnormal findings: Secondary | ICD-10-CM | POA: Diagnosis not present

## 2016-02-01 DIAGNOSIS — E669 Obesity, unspecified: Secondary | ICD-10-CM

## 2016-02-01 DIAGNOSIS — Z0000 Encounter for general adult medical examination without abnormal findings: Secondary | ICD-10-CM

## 2016-02-01 DIAGNOSIS — N760 Acute vaginitis: Secondary | ICD-10-CM

## 2016-02-01 DIAGNOSIS — N898 Other specified noninflammatory disorders of vagina: Secondary | ICD-10-CM | POA: Diagnosis not present

## 2016-02-01 DIAGNOSIS — B9689 Other specified bacterial agents as the cause of diseases classified elsewhere: Secondary | ICD-10-CM

## 2016-02-01 LAB — POCT WET PREP (WET MOUNT)
Clue Cells Wet Prep Whiff POC: POSITIVE
TRICHOMONAS WET PREP HPF POC: ABSENT

## 2016-02-01 MED ORDER — METRONIDAZOLE 500 MG PO TABS: 500.0000 mg | ORAL_TABLET | Freq: Two times a day (BID) | ORAL | 0 refills | Status: DC

## 2016-02-01 NOTE — Patient Instructions (Signed)

## 2016-02-01 NOTE — Progress Notes (Signed)
Subjective:   Jacqueline Underwood is a 25 y.o. [phone] African American female here for a routine well-woman exam.  No LMP recorded. Patient has had an implant.    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  Past Medical History Past Medical History:  Diagnosis Date  . BV (bacterial vaginosis)   . Pregnant   . Uterine fibroid in pregnancy   . UTI (urinary tract infection)   . Yeast infection     Past Surgical History Past Surgical History:  Procedure Laterality Date  . CESAREAN SECTION MULTI-GESTATIONAL  11/18/2014   Procedure: CESAREAN SECTION MULTI-GESTATIONAL;  Surgeon: Donnamae Jude, MD;  Location: [facility];  Service: Obstetrics;;  . NO PAST SURGERIES      Gynecologic History [id number]  No LMP recorded. Patient has had an implant. Contraception: Nexplanon Last Pap: 05/2014. Results were: normal Last mammogram: never. Results were: n/a Last TCS: never  Obstetric History OB History  Gravida Para Term Preterm AB Living  3 2 1 1 1 3   SAB TAB Ectopic Multiple Live Births  1     1 3     # Outcome Date GA Lbr Len/2nd Weight Sex Delivery Anes PTL Lv  3A Preterm 11/18/14 [redacted]w[redacted]d  3 lb 9.1 oz (1.619 kg) M CS-LTranv Spinal  LIV  3B Preterm 11/18/14 [redacted]w[redacted]d  4 lb 1.6 oz (1.86 kg) M CS-LTranv Spinal  LIV  2 Term 01/15/13 [redacted]w[redacted]d 18:53 / 01:11 7 lb 3.5 oz (3.274 kg) M Vag-Spont EPI  LIV     Birth Comments: within normal limits  1 SAB 2013              Current Medications Current  Outpatient Prescriptions on File Prior to Visit  Medication Sig Dispense Refill  . megestrol (MEGACE) 40 MG tablet 3x5d, 2x5d, then 1 daily to help control vaginal bleeding. Stop taking when bleeding stops. 45 tablet 1  . ibuprofen (ADVIL,MOTRIN) 600 MG tablet Take 1 tablet (600 mg total) by mouth every 6 (six) hours as needed for mild pain. (Patient not taking: Reported on 01/04/2015) 30 tablet 0  . loratadine (CLARITIN) 10 MG tablet Take 10 mg by mouth daily.    Marland Kitchen oxyCODONE-acetaminophen (PERCOCET/ROXICET) 5-325 MG per tablet Take 1-2 tablets by mouth every 6 (six) hours as needed (for pain scale 4-7). (Patient not taking: Reported on 01/04/2015) 30 tablet 0  . Prenatal Vit-Fe Fumarate-FA (PRENATAL MULTIVITAMIN) TABS Take 1 tablet by mouth daily.     No current facility-administered medications on file prior to visit.     Review of Systems Patient denies any headaches, blurred vision, shortness of breath, chest pain, abdominal pain, problems with bowel movements, urination, or intercourse.  Objective:  BP 108/70 (BP Location: Right Arm, Patient Position: Sitting, Cuff Size: Normal)   Pulse 72   Ht 5' 1.5" (1.562 m)   Wt 205 lb (93 kg)   BMI 38.11 kg/m  Physical Exam  General:  Well developed, well nourished, no acute distress. She is alert and oriented x3. Skin:  Warm and dry Neck:  Midline trachea,  no thyromegaly or nodules Cardiovascular: Regular rate and rhythm, no murmur heard Lungs:  Effort normal, all lung fields clear to auscultation bilaterally Breasts:  No dominant palpable mass, retraction, or nipple discharge Abdomen:  Soft, non tender, no hepatosplenomegaly or masses Pelvic:  External genitalia is normal in appearance.  The vagina is normal in appearance, mod amt thin white malodorous d/c. The cervix is bulbous, no CMT.  Thin prep pap is not . Uterus is felt to be normal size, shape, and contour.  No adnexal masses or tenderness noted. Extremities:  No swelling or varicosities  noted Psych:  She has a normal mood and affect  Results for orders placed or performed in visit on 02/01/16 (from the past 24 hour(s))  POCT Wet Prep [facility] [place])     Status: Abnormal   Collection Time: 02/01/16 11:07 AM  Result Value Ref Range   Source Wet Prep POC vaginal    WBC, Wet Prep HPF POC none    Bacteria Wet Prep HPF POC None None, Few, Too numerous to count   BACTERIA WET PREP MORPHOLOGY POC     Clue Cells Wet Prep HPF POC Many (A) None, Too numerous to count   Clue Cells Wet Prep Whiff POC Positive Whiff    Yeast Wet Prep HPF POC None    KOH Wet Prep POC     Trichomonas Wet Prep HPF POC Absent Absent     Assessment:   Healthy well-woman exam BV Obese  Plan:  Rx metronidazole 500mg  BID x 7d for BV, no sex or etoh while taking, stop using perfumed soaps, use mild soaps, don't overwash, don't douche Advised weight loss, decrease carbs/calories, increase exercise GC/CT per request, was neg at end of July F/U 50yr for physical, or sooner if needed Mammogram @25yo  or sooner if problems Colonoscopy @25yo  or sooner if problems  Tawnya Crook CNM, WHNP-BC 02/01/2016 11:01 AM

## 2016-02-02 LAB — GC/CHLAMYDIA PROBE AMP
Chlamydia trachomatis, NAA: NEGATIVE
NEISSERIA GONORRHOEAE BY PCR: NEGATIVE

## 2016-10-08 ENCOUNTER — Ambulatory Visit: Payer: Medicaid Other | Admitting: Women's Health

## 2016-11-15 ENCOUNTER — Ambulatory Visit (INDEPENDENT_AMBULATORY_CARE_PROVIDER_SITE_OTHER): Payer: BLUE CROSS/BLUE SHIELD | Admitting: Women's Health

## 2016-11-15 ENCOUNTER — Encounter: Payer: Self-pay | Admitting: Women's Health

## 2016-11-15 VITALS — BP 90/62 | HR 83 | Ht 60.0 in | Wt 212.0 lb

## 2016-11-15 DIAGNOSIS — B9689 Other specified bacterial agents as the cause of diseases classified elsewhere: Secondary | ICD-10-CM | POA: Diagnosis not present

## 2016-11-15 DIAGNOSIS — N760 Acute vaginitis: Secondary | ICD-10-CM | POA: Diagnosis not present

## 2016-11-15 DIAGNOSIS — N898 Other specified noninflammatory disorders of vagina: Secondary | ICD-10-CM

## 2016-11-15 LAB — POCT WET PREP (WET MOUNT)
Clue Cells Wet Prep Whiff POC: POSITIVE
Trichomonas Wet Prep HPF POC: ABSENT

## 2016-11-15 MED ORDER — METRONIDAZOLE 500 MG PO TABS: 500.0000 mg | ORAL_TABLET | Freq: Two times a day (BID) | ORAL | 0 refills | Status: DC

## 2016-11-15 NOTE — Progress Notes (Signed)
   [facility] Visit  Patient name: Jacqueline Underwood MRN [id number]  Date of birth: 08-03-1990  CC & HPI:  Jacqueline Underwood is a 26 y.o. [phone] African American female presenting today for report of vaginal d/c with itching and odor x 1wk, thinks it's BV again. Changed soaps from [place] to something w/ perfume in it.  Patient's last menstrual period was 10/14/2016 (approximate). The current method of family planning is nexplanon, inserted 01/25/15. Last pap 06/23/14  Pertinent History Reviewed:  Medical & Surgical Hx:   Past medical, surgical, family, and social history reviewed in electronic medical record Medications: Reviewed & Updated - see associated section Allergies: Reviewed in electronic medical record  Objective Findings:  Vitals: BP 90/62 (BP Location: Left Arm, Patient Position: Sitting, Cuff Size: Large)   Pulse 83   Ht 5' (1.524 m)   Wt 212 lb (96.2 kg)   LMP 10/14/2016 (Approximate)   Breastfeeding? No   BMI 41.40 kg/m  Body mass index is 41.4 kg/m.  Physical Examination: General appearance - alert, well appearing, and in no distress Pelvic - cx clear, scant white slightly malodorous d/c  Results for orders placed or performed in visit on 11/15/16 (from the past 24 hour(s))  POCT Wet Prep [facility] [place])   Collection Time: 11/15/16  4:44 PM  Result Value Ref Range   Source Wet Prep POC vaginal    WBC, Wet Prep HPF POC few    Bacteria Wet Prep HPF POC None (A) Few   BACTERIA WET PREP MORPHOLOGY POC     Clue Cells Wet Prep HPF POC Few (A) None   Clue Cells Wet Prep Whiff POC Positive Whiff    Yeast Wet Prep HPF POC None    KOH Wet Prep POC     Trichomonas Wet Prep HPF POC Absent Absent     Assessment & Plan:  A:   BV  P:  Rx metronidazole 500mg  BID x 7d for BV, no sex or etoh while taking   Go back to [employer]  Gc/ct today  Return for after 9/6 for , Physical.  Tawnya Crook CNM, [facility] 11/15/2016 4:44 PM

## 2016-11-17 LAB — GC/CHLAMYDIA PROBE AMP
Chlamydia trachomatis, NAA: NEGATIVE
Neisseria gonorrhoeae by PCR: NEGATIVE

## 2016-12-16 ENCOUNTER — Emergency Department (HOSPITAL_COMMUNITY): Payer: BLUE CROSS/BLUE SHIELD

## 2016-12-16 ENCOUNTER — Encounter (HOSPITAL_COMMUNITY): Payer: Self-pay | Admitting: Emergency Medicine

## 2016-12-16 ENCOUNTER — Emergency Department (HOSPITAL_COMMUNITY)
Admission: EM | Admit: 2016-12-16 | Discharge: 2016-12-16 | Disposition: A | Discharge status: Home / Self Care | Payer: BLUE CROSS/BLUE SHIELD | Attending: Emergency Medicine | Admitting: Emergency Medicine

## 2016-12-16 DIAGNOSIS — D259 Leiomyoma of uterus, unspecified: Secondary | ICD-10-CM | POA: Diagnosis not present

## 2016-12-16 DIAGNOSIS — R1030 Lower abdominal pain, unspecified: Secondary | ICD-10-CM

## 2016-12-16 LAB — COMPREHENSIVE METABOLIC PANEL
ALBUMIN: 4.1 g/dL (ref 3.5–5.0)
ALT: 21 U/L (ref 14–54)
AST: 22 U/L (ref 15–41)
Alkaline Phosphatase: 53 U/L (ref 38–126)
Anion gap: 6 (ref 5–15)
BUN: 12 mg/dL (ref 6–20)
CHLORIDE: 109 mmol/L (ref 101–111)
CO2: 28 mmol/L (ref 22–32)
Calcium: 9.8 mg/dL (ref 8.9–10.3)
Creatinine, Ser: 0.72 mg/dL (ref 0.44–1.00)
GFR calc Af Amer: >60 mL/min (ref >60)
GFR calc non Af Amer: >60 mL/min (ref >60)
GLUCOSE: 90 mg/dL (ref 65–99)
POTASSIUM: 3.6 mmol/L (ref 3.5–5.1)
Sodium: 143 mmol/L (ref 135–145)
Total Bilirubin: 0.8 mg/dL (ref 0.3–1.2)
Total Protein: 8 g/dL (ref 6.5–8.1)

## 2016-12-16 LAB — URINALYSIS, ROUTINE W REFLEX MICROSCOPIC
BACTERIA UA: NONE SEEN
BILIRUBIN URINE: NEGATIVE
Glucose, UA: NEGATIVE mg/dL
Ketones, ur: NEGATIVE mg/dL
Leukocytes, UA: NEGATIVE
Nitrite: NEGATIVE
Protein, ur: NEGATIVE mg/dL
SPECIFIC GRAVITY, URINE: 1.02 (ref 1.005–1.030)
pH: 6 (ref 5.0–8.0)

## 2016-12-16 LAB — CBC
HEMATOCRIT: 38.1 % (ref 36.0–46.0)
Hemoglobin: 12.7 g/dL (ref 12.0–15.0)
MCH: 31.1 pg (ref 26.0–34.0)
MCHC: 33.3 g/dL (ref 30.0–36.0)
MCV: 93.2 fL (ref 78.0–100.0)
Platelets: 263 10*3/uL (ref 150–400)
RBC: 4.09 MIL/uL (ref 3.87–5.11)
RDW: 12 % (ref 11.5–15.5)
WBC: 6.8 10*3/uL (ref 4.0–10.5)

## 2016-12-16 LAB — PREGNANCY, URINE: PREG TEST UR: NEGATIVE

## 2016-12-16 LAB — LIPASE, BLOOD: Lipase: 21 U/L (ref 11–51)

## 2016-12-16 MED ORDER — SODIUM CHLORIDE 0.9 % IV BOLUS (SEPSIS)
1000.0000 mL | Freq: Once | INTRAVENOUS | Status: AC
  Administered 2016-12-16: 1000 mL via INTRAVENOUS

## 2016-12-16 MED ORDER — IOPAMIDOL (ISOVUE-300) INJECTION 61%
100.0000 mL | Freq: Once | INTRAVENOUS | Status: AC | PRN
  Administered 2016-12-16: 100 mL via INTRAVENOUS

## 2016-12-16 MED ORDER — IBUPROFEN 800 MG PO TABS: 800.0000 mg | ORAL_TABLET | Freq: Three times a day (TID) | ORAL | 0 refills | Status: DC | PRN

## 2016-12-16 NOTE — ED Triage Notes (Signed)
Patient c/o left lower abd pain x1 month. Per patient diarrhea and irregular period. Denies any nausea, vomiting, fevers, or urinary symptoms.

## 2016-12-16 NOTE — ED Notes (Signed)
Pt states abdominal pain over the past few days getting worse. Pt says she has a knot in her abdomen for months. Denies any N/V.

## 2016-12-16 NOTE — ED Provider Notes (Signed)
Emergency Department Provider Note   I have reviewed the triage vital signs and the nursing notes.   HISTORY  Chief Complaint Abdominal Pain   HPI Philamena BIRDELLA SIPPEL is a 26 y.o. female with PMH of uterine fibroids and BV presents to the emergency room in for evaluation of worsening lower abdominal pain with irregular periods and palpable "knot" in her lower abdomen. She has been on Nexplanon birth control for the last year. Over the last month her symptoms have worsened. No fever or chills. She has had some associated diarrhea but no vomiting or nausea. No vaginal discharge or pelvic discomfort. Pain is midline, moderate, and nonradiating. No alleviating factors.   Past Medical History:  Diagnosis Date  . BV (bacterial vaginosis)   . Pregnant   . Uterine fibroid in pregnancy   . UTI (urinary tract infection)   . Yeast infection     Patient Active Problem List   Diagnosis Date Noted  . Nexplanon insertion 01/25/2015  . Preterm delivery, delivered 01/04/2015  . H/O cesarean section 01/04/2015  . Uterine fibroid in pregnancy 10/21/2012    Past Surgical History:  Procedure Laterality Date  . CESAREAN SECTION MULTI-GESTATIONAL  11/18/2014   Procedure: CESAREAN SECTION MULTI-GESTATIONAL;  Surgeon: Donnamae Jude, MD;  Location: [facility];  Service: Obstetrics;;  . NO PAST SURGERIES      Current Outpatient Rx  . Order #: [id number] Class: Print  . Order #: [id number] Class: Normal    Allergies Peanuts [peanut oil]  Family History  Problem Relation Age of Onset  . Hypertension Father   . Diabetes Maternal Grandmother     Social History Social History  Substance Use Topics  . Smoking status: Never Smoker  . Smokeless tobacco: Never Used  . Alcohol use No    Review of Systems  Constitutional: No fever/chills Eyes: No visual changes. ENT: No sore throat. Cardiovascular: Denies chest pain. Respiratory: Denies shortness of breath. Gastrointestinal: Positive lower  abdominal pain and suprapubic mass.  No nausea, no vomiting. Positive diarrhea.  No constipation. Genitourinary: Negative for dysuria. Irregular periods.  Musculoskeletal: Negative for back pain. Skin: Negative for rash. Neurological: Negative for headaches, focal weakness or numbness.  10-point ROS otherwise negative.  ____________________________________________   PHYSICAL EXAM:  VITAL SIGNS: ED Triage Vitals [12/16/16 1853]  Enc Vitals Group     BP 100/73     Pulse Rate 72     Resp 18     Temp 98.2 F (36.8 C)     Temp Source Oral     SpO2 100 %     Weight 210 lb (95.3 kg)     Height 5' (1.524 m)     Pain Score 7   Constitutional: Alert and oriented. Well appearing and in no acute distress. Eyes: Conjunctivae are normal. Head: Atraumatic. Nose: No congestion/rhinnorhea. Mouth/Throat: Mucous membranes are moist.  Oropharynx non-erythematous. Neck: No stridor.  Cardiovascular: Normal rate, regular rhythm. Good peripheral circulation. Grossly normal heart sounds.   Respiratory: Normal respiratory effort.  No retractions. Lungs CTAB. Gastrointestinal: Soft with palpable midline abdominal mass with moderate tenderness. Mass appears to be mobile. No overlying cellulitis. No significant distention.  Musculoskeletal: No lower extremity tenderness nor edema. No gross deformities of extremities. Neurologic:  Normal speech and language. No gross focal neurologic deficits are appreciated.  Skin:  Skin is warm, dry and intact. No rash noted.  ____________________________________________   LABS (all labs ordered are listed, but only abnormal results are displayed)  Labs Reviewed  URINALYSIS, ROUTINE W REFLEX MICROSCOPIC - Abnormal; Notable for the following:       Result Value   Hgb urine dipstick SMALL (*)    Squamous Epithelial / LPF 0-5 (*)    All other components within normal limits  LIPASE, BLOOD  COMPREHENSIVE METABOLIC PANEL  CBC  PREGNANCY, URINE    ____________________________________________  RADIOLOGY  Ct Abdomen Pelvis W Contrast  Result Date: 12/16/2016 CLINICAL DATA:  Left lower abdominal pain for 1 month. EXAM: CT ABDOMEN AND PELVIS WITH CONTRAST TECHNIQUE: Multidetector CT imaging of the abdomen and pelvis was performed using the standard protocol following bolus administration of intravenous contrast. CONTRAST:  14mL ISOVUE-300 IOPAMIDOL (ISOVUE-300) INJECTION 61% COMPARISON:  Ob ultrasound exam 06/05/2014. FINDINGS: Lower chest: 3 mm left lower lobe pulmonary nodule (image 16 series 4). Hepatobiliary: No focal abnormality within the liver parenchyma. Gallbladder decompressed. No intrahepatic or extrahepatic biliary dilation. Pancreas: No focal mass lesion. No dilatation of the main duct. No intraparenchymal cyst. No peripancreatic edema. Spleen: No splenomegaly. No focal mass lesion. Adrenals/Urinary Tract: No adrenal nodule or mass. Kidneys unremarkable. No evidence for hydroureter. The urinary bladder appears normal for the degree of distention. Stomach/Bowel: Tiny hiatal hernia. Stomach otherwise unremarkable. No small bowel wall thickening. No small bowel dilatation. The terminal ileum is normal. The appendix is normal. No gross colonic mass. No colonic wall thickening. No substantial diverticular change. Vascular/Lymphatic: No abdominal aortic aneurysm. No abdominal aortic atherosclerotic calcification. There is no gastrohepatic or hepatoduodenal ligament lymphadenopathy. No intraperitoneal or retroperitoneal lymphadenopathy. No pelvic sidewall lymphadenopathy. Reproductive: Uterus measures 18.7 x 10.1 x 11.4 cm. A large posterior fundal fibroid measures 10.3 x 8.6 x 10.8 cm which compares to ultrasound measurements obtained previously of 6.5 x 9.3 x 6.6 cm. There is no adnexal mass. Other: No intraperitoneal free fluid. Musculoskeletal: Bone windows reveal no worrisome lytic or sclerotic osseous lesions. IMPRESSION: 1. No acute  findings in the abdomen or pelvis. 2. Large posterior fundal fibroid measures up to 10.8 cm and results inuterine enlargement with top of fibroid extending up just cranial to the umbilicus. 3. 3 mm left lower lobe pulmonary nodule. No follow-up needed if patient is low-risk. Non-contrast chest CT can be considered in 12 months if patient is high-risk. This recommendation follows the consensus statement: Guidelines for Management of Incidental Pulmonary Nodules Detected on CT Images: From the Fleischner Society 2017; Radiology 2017; 284:228-243. Electronically Signed   By: Misty Stanley M.D.   On: 12/16/2016 21:16    ____________________________________________   PROCEDURES  Procedure(s) performed:   Procedures  None ____________________________________________   INITIAL IMPRESSION / ASSESSMENT AND PLAN / ED COURSE  Pertinent labs & imaging results that were available during my care of the patient were reviewed by me and considered in my medical decision making (see chart for details).  Patient resents to the emergency room in for evaluation of moderate lower abdominal pain and palpable mass. She does have a history of uterine fibroids. With the irregular menstrual periods over the past month and associated lower abdominal pain this palpable mass may represent a large uterine fibroid, possibly pedunculated. Patient also notes history of ovarian cysts. There is no sudden, severe pain to suggest ovarian torsion. Given the palpable, tender mass plan for CT to further evaluate.   09:30 PM Patient with large uterine fibroid on CT. I have referred her to OB/GYN and prescribed Motrin for pain. Discussed return precautions in detail with patient and family at bedside.  At this time, I do not  feel there is any life-threatening condition present. I have reviewed and discussed all results (EKG, imaging, lab, urine as appropriate), exam findings with patient. I have reviewed nursing notes and  appropriate previous records.  I feel the patient is safe to be discharged home without further emergent workup. Discussed usual and customary return precautions. Patient and family (if present) verbalize understanding and are comfortable with this plan.  Patient will follow-up with their primary care provider. If they do not have a primary care provider, information for follow-up has been provided to them. All questions have been answered.  ____________________________________________  FINAL CLINICAL IMPRESSION(S) / ED DIAGNOSES  Final diagnoses:  Lower abdominal pain  Uterine leiomyoma, unspecified location     MEDICATIONS GIVEN DURING THIS VISIT:  Medications  sodium chloride 0.9 % bolus 1,000 mL (0 mLs Intravenous Stopped 12/16/16 2045)  iopamidol (ISOVUE-300) 61 % injection 100 mL (100 mLs Intravenous Contrast Given 12/16/16 2027)     NEW OUTPATIENT MEDICATIONS STARTED DURING THIS VISIT:  New Prescriptions   IBUPROFEN (ADVIL,MOTRIN) 800 MG TABLET    Take 1 tablet (800 mg total) by mouth every 8 (eight) hours as needed.      Note:  This document was prepared using Dragon voice recognition software and may include unintentional dictation errors.  Nanda Quinton, MD Emergency Medicine    Annissa Andreoni, [facility], MD 12/16/16 2136

## 2016-12-16 NOTE — ED Notes (Signed)
Pt alert & oriented x4, stable gait. Patient given discharge instructions, paperwork & prescription(s). Patient  instructed to stop at the registration desk to finish any additional paperwork. Patient verbalized understanding. Pt left department w/ no further questions. 

## 2016-12-16 NOTE — Discharge Instructions (Signed)
You were seen in the ED today with uterine fibroids and lower abdominal pain. Follow up with the OB/Gyn in the coming days to discuss further management of your fibroids.

## 2016-12-31 ENCOUNTER — Encounter: Payer: Self-pay | Admitting: Obstetrics & Gynecology

## 2016-12-31 ENCOUNTER — Ambulatory Visit (INDEPENDENT_AMBULATORY_CARE_PROVIDER_SITE_OTHER): Payer: BLUE CROSS/BLUE SHIELD | Admitting: Obstetrics & Gynecology

## 2016-12-31 VITALS — BP 116/68 | HR 70 | Wt 215.0 lb

## 2016-12-31 DIAGNOSIS — D259 Leiomyoma of uterus, unspecified: Secondary | ICD-10-CM | POA: Diagnosis not present

## 2016-12-31 DIAGNOSIS — N921 Excessive and frequent menstruation with irregular cycle: Secondary | ICD-10-CM | POA: Diagnosis not present

## 2016-12-31 DIAGNOSIS — N946 Dysmenorrhea, unspecified: Secondary | ICD-10-CM

## 2016-12-31 NOTE — Progress Notes (Signed)
Chief Complaint  Jacqueline Underwood presents with  . Follow-up    ED visit-fibroids    Blood pressure 116/68, pulse 70, weight 215 lb (97.5 kg), last menstrual period 12/09/2016.  26 y.o. [id number] Jacqueline Underwood's last menstrual period was 12/09/2016 (approximate). The current method of family planning is Nexplanon.  Outpatient Encounter Prescriptions as of 12/31/2016  Medication Sig  . ibuprofen (ADVIL,MOTRIN) 800 MG tablet Take 1 tablet (800 mg total) by mouth every 8 (eight) hours as needed. (Jacqueline Underwood not taking: Reported on 12/31/2016)  . [DISCONTINUED] metroNIDAZOLE (FLAGYL) 500 MG tablet Take 1 tablet (500 mg total) by mouth 2 (two) times daily. X 7 days. No sex or alcohol while taking   No facility-administered encounter medications on file as of 12/31/2016.     Subjective Jacqueline Underwood is known to have a significant fibroid for the past several years  She had a set of dye.twins 2 years ago and we knew she had about a 10 cm fibroid at that time  She's been having increasing problems with lower abdominal pain and bleeding dysmenorrhea since that time even though she is on the nexplanon  As a result she wants to go ahead and have a myomectomy because her increasing pain and bleeding and dysmenorrhea  Additionally she wants a time it when her work to shutdown because of renovations which will be in October  Objective CT scan a previous sonograms are reviewed and 10+ centimeters fibroid is noted  Pertinent ROS No burning with urination, frequency or urgency No nausea, vomiting or diarrhea Nor fever chills or other constitutional symptoms   Labs or studies Reviewed scans and ultrasound most recently July 22 Her most  recent hemoglobin was 12.7    Impression Diagnoses this Encounter::   ICD-10-CM   1. Uterine leiomyoma, unspecified location D25.9   2. Dysmenorrhea N94.6   3. Menometrorrhagia N92.1    Established relevant diagnosis(es):   Plan/Recommendations: No orders of the  defined types were placed in this encounter.   Labs or Scans Ordered: No orders of the defined types were placed in this encounter.   Management:: Due to Jacqueline Underwood's work schedule her plan will be closed from October 12 through December 3 so we will  plan to do her abdominal approach myomectomy on October 17 See her a week or 2 prior to that for her preop visit to go over final plans and answer any questions she may have  Follow up Return in about 2 months (around 03/04/2017) for 4pm with Dr Elonda Husky, pre op.   Face to face time:  15 minutes  Greater than 50% of the visit time was spent in counseling and coordination of care with the Jacqueline Underwood.  The summary and outline of the counseling and care coordination is summarized in the note above.   All questions were answered.  Past Medical History:  Diagnosis Date  . BV (bacterial vaginosis)   . Pregnant   . Uterine fibroid in pregnancy   . UTI (urinary tract infection)   . Yeast infection     Past Surgical History:  Procedure Laterality Date  . CESAREAN SECTION MULTI-GESTATIONAL  11/18/2014   Procedure: CESAREAN SECTION MULTI-GESTATIONAL;  Surgeon: Donnamae Jude, MD;  Location: [facility];  Service: Obstetrics;;  . NO PAST SURGERIES      OB History    Gravida Para Term Preterm AB Living   3 2 1 1 1 3    SAB TAB Ectopic Multiple Live Births   1  1 3      Allergies  Allergen Reactions  . Peanuts [Peanut Oil] Itching    Itching in throat.    Social History   Social History  . Marital status: Single    Spouse name: N/A  . Number of children: N/A  . Years of education: N/A   Social History Main Topics  . Smoking status: Never Smoker  . Smokeless tobacco: Never Used  . Alcohol use No  . Drug use: No  . Sexual activity: Yes    Birth control/ protection: Implant   Other Topics Concern  . None   Social History Narrative  . None    Family History  Problem Relation Age of Onset  . Hypertension Father   . Diabetes  Maternal Grandmother

## 2017-02-01 ENCOUNTER — Other Ambulatory Visit: Payer: BLUE CROSS/BLUE SHIELD | Admitting: Women's Health

## 2017-02-12 ENCOUNTER — Encounter: Payer: BLUE CROSS/BLUE SHIELD | Admitting: Women's Health

## 2017-03-04 ENCOUNTER — Encounter: Payer: BLUE CROSS/BLUE SHIELD | Admitting: Obstetrics & Gynecology

## 2017-03-07 ENCOUNTER — Other Ambulatory Visit: Payer: BLUE CROSS/BLUE SHIELD | Admitting: Women's Health

## 2017-03-25 ENCOUNTER — Other Ambulatory Visit (HOSPITAL_COMMUNITY)
Admission: RE | Admit: 2017-03-25 | Discharge: 2017-03-25 | Disposition: A | Discharge status: Home / Self Care | Payer: BLUE CROSS/BLUE SHIELD | Source: Ambulatory Visit | Attending: Adult Health | Admitting: Adult Health

## 2017-03-25 ENCOUNTER — Encounter: Payer: Self-pay | Admitting: Adult Health

## 2017-03-25 ENCOUNTER — Ambulatory Visit (INDEPENDENT_AMBULATORY_CARE_PROVIDER_SITE_OTHER): Payer: BLUE CROSS/BLUE SHIELD | Admitting: Adult Health

## 2017-03-25 VITALS — BP 110/70 | HR 88 | Ht 62.0 in | Wt 216.5 lb

## 2017-03-25 DIAGNOSIS — Z01411 Encounter for gynecological examination (general) (routine) with abnormal findings: Secondary | ICD-10-CM | POA: Diagnosis not present

## 2017-03-25 DIAGNOSIS — N921 Excessive and frequent menstruation with irregular cycle: Secondary | ICD-10-CM | POA: Diagnosis not present

## 2017-03-25 DIAGNOSIS — N852 Hypertrophy of uterus: Secondary | ICD-10-CM | POA: Insufficient documentation

## 2017-03-25 DIAGNOSIS — N946 Dysmenorrhea, unspecified: Secondary | ICD-10-CM

## 2017-03-25 DIAGNOSIS — Z975 Presence of (intrauterine) contraceptive device: Secondary | ICD-10-CM

## 2017-03-25 DIAGNOSIS — Z01419 Encounter for gynecological examination (general) (routine) without abnormal findings: Secondary | ICD-10-CM

## 2017-03-25 DIAGNOSIS — F489 Nonpsychotic mental disorder, unspecified: Secondary | ICD-10-CM

## 2017-03-25 DIAGNOSIS — Z3009 Encounter for other general counseling and advice on contraception: Secondary | ICD-10-CM

## 2017-03-25 DIAGNOSIS — R4589 Other symptoms and signs involving emotional state: Secondary | ICD-10-CM | POA: Insufficient documentation

## 2017-03-25 DIAGNOSIS — D259 Leiomyoma of uterus, unspecified: Secondary | ICD-10-CM

## 2017-03-25 DIAGNOSIS — D219 Benign neoplasm of connective and other soft tissue, unspecified: Secondary | ICD-10-CM

## 2017-03-25 HISTORY — DX: Dysmenorrhea, unspecified: N94.6

## 2017-03-25 HISTORY — DX: Excessive and frequent menstruation with irregular cycle: N92.1

## 2017-03-25 HISTORY — DX: Other symptoms and signs involving emotional state: R45.89

## 2017-03-25 NOTE — Patient Instructions (Signed)
Uterine Fibroids Uterine fibroids are tissue masses (tumors) that can develop in the womb (uterus). They are also called leiomyomas. This type of tumor is not cancerous (benign) and does not spread to other parts of the body outside of the pelvic area, which is between the hip bones. Occasionally, fibroids may develop in the fallopian tubes, in the cervix, or on the support structures (ligaments) that surround the uterus. You can have one or many fibroids. Fibroids can vary in size, weight, and where they grow in the uterus. Some can become quite large. Most fibroids do not require medical treatment. What are the causes? A fibroid can develop when a single uterine cell keeps growing (replicating). Most cells in the human body have a control mechanism that keeps them from replicating without control. What are the signs or symptoms? Symptoms may include:  Heavy bleeding during your period.  Bleeding or spotting between periods.  Pelvic pain and pressure.  Bladder problems, such as needing to urinate more often (urinary frequency) or urgently.  Inability to reproduce offspring (infertility).  Miscarriages.  How is this diagnosed? Uterine fibroids are diagnosed through a physical exam. Your health care provider may feel the lumpy tumors during a pelvic exam. Ultrasonography and an MRI may be done to determine the size, location, and number of fibroids. How is this treated? Treatment may include:  Watchful waiting. This involves getting the fibroid checked by your health care provider to see if it grows or shrinks. Follow your health care provider's recommendations for how often to have this checked.  Hormone medicines. These can be taken by mouth or given through an intrauterine device (IUD).  Surgery. ? Removing the fibroids (myomectomy) or the uterus (hysterectomy). ? Removing blood supply to the fibroids (uterine artery embolization).  If fibroids interfere with your fertility and you  want to become pregnant, your health care provider may recommend having the fibroids removed. Follow these instructions at home:  Keep all follow-up visits as directed by your health care provider. This is important.  Take over-the-counter and prescription medicines only as told by your health care provider. ? If you were prescribed a hormone treatment, take the hormone medicines exactly as directed.  Ask your health care provider about taking iron pills and increasing the amount of dark green, leafy vegetables in your diet. These actions can help to boost your blood iron levels, which may be affected by heavy menstrual bleeding.  Pay close attention to your period and tell your health care provider about any changes, such as: ? Increased blood flow that requires you to use more pads or tampons than usual per month. ? A change in the number of days that your period lasts per month. ? A change in symptoms that are associated with your period, such as abdominal cramping or back pain. Contact a health care provider if:  You have pelvic pain, back pain, or abdominal cramps that cannot be controlled with medicines.  You have an increase in bleeding between and during periods.  You soak tampons or pads in a half hour or less.  You feel lightheaded, extra tired, or weak. Get help right away if:  You faint.  You have a sudden increase in pelvic pain. This information is not intended to replace advice given to you by your health care provider. Make sure you discuss any questions you have with your health care provider. Document Released: 05/11/2000 Document Revised: 01/12/2016 Document Reviewed: 11/10/2013 Elsevier Interactive Patient Education  2018 [employer]  

## 2017-03-25 NOTE — Progress Notes (Signed)
Patient ID: Jacqueline Underwood, female   DOB: [birth year], 26 y.o.   MRN: [id number] History of Present Illness: Jacqueline Underwood is a 26 year old black female in for a well woman gyn exam and pap.She has nexplanon and has heavy painful periods, and has knot in stomach that hurts when kids in her lap.She has know fibroids.She is also more moody.    Current Medications, Allergies, Past Medical History, Past Surgical History, Family History and Social History were reviewed in [employer] record.     Review of Systems: Patient denies any headaches, hearing loss, fatigue, blurred vision, shortness of breath, chest pain, abdominal pain, problems with bowel movements, urination, or intercourse. No joint pan.See HPI for positives.    Physical Exam:BP 110/70 (BP Location: Left Arm, Patient Position: Sitting, Cuff Size: Large)   Pulse 88   Ht 5\' 2"  (1.575 m)   Wt 216 lb 8 oz (98.2 kg)   LMP 03/15/2017   BMI 39.60 kg/m  General:  Well developed, well nourished, no acute distress Skin:  Warm and dry Neck:  Midline trachea, normal thyroid, good ROM, no lymphadenopathy Lungs; Clear to auscultation bilaterally Breast:  No dominant palpable mass, retraction, or nipple discharge Cardiovascular: Regular rate and rhythm Abdomen:  Soft, non tender, no hepatosplenomegaly Pelvic:  External genitalia is normal in appearance, no lesions.  The vagina is normal in appearance. Urethra has no lesions or masses. The cervix is bulbous.Pap with GC/CHL performed.  Uterus is enlarged to about 20 weeks with large right sided fibroid felt.  No adnexal masses or tenderness noted.Bladder is non tender, no masses felt. Extremities/musculoskeletal:  No swelling or varicosities noted, no clubbing or cyanosis Psych:  No mood changes, alert and cooperative,seems happy PHQ 2 score 0. Discussed getting fibroids removed, and she wants nexplanon removed and does not want more kids, so to talk with Dr Elonda Husky about myomectomy,  and tubal  VS [facility].  Impression: 1. Encounter for gynecological examination with Papanicolaou smear of cervix   2. Family planning   3. Enlarged uterus   4. Fibroids   5. Nexplanon in place   6. Moody   7. Menometrorrhagia   8. Dysmenorrhea       Plan:  Check HIV and RPR Review handout on fibroids Return in 1 week to talk surgery with Dr Elonda Husky Physical in 1 year/pap in 3 if normal

## 2017-03-26 LAB — CYTOLOGY - PAP
ADEQUACY: ABSENT
Chlamydia: NEGATIVE
DIAGNOSIS: NEGATIVE
Neisseria Gonorrhea: NEGATIVE

## 2017-03-26 LAB — RPR: RPR: NONREACTIVE

## 2017-03-26 LAB — HIV ANTIBODY (ROUTINE TESTING W REFLEX): HIV Screen 4th Generation wRfx: NONREACTIVE

## 2017-04-04 ENCOUNTER — Ambulatory Visit (INDEPENDENT_AMBULATORY_CARE_PROVIDER_SITE_OTHER): Payer: BLUE CROSS/BLUE SHIELD | Admitting: Obstetrics & Gynecology

## 2017-04-04 ENCOUNTER — Encounter: Payer: Self-pay | Admitting: Obstetrics & Gynecology

## 2017-04-04 VITALS — BP 102/62 | HR 81 | Ht 60.0 in | Wt 215.0 lb

## 2017-04-04 DIAGNOSIS — N946 Dysmenorrhea, unspecified: Secondary | ICD-10-CM

## 2017-04-04 DIAGNOSIS — N921 Excessive and frequent menstruation with irregular cycle: Secondary | ICD-10-CM | POA: Diagnosis not present

## 2017-04-04 DIAGNOSIS — D259 Leiomyoma of uterus, unspecified: Secondary | ICD-10-CM | POA: Diagnosis not present

## 2017-04-04 NOTE — Progress Notes (Signed)
Preoperative History and Physical  Jacqueline Underwood is a 26 y.o. [phone] with Patient's last menstrual period was 03/15/2017. admitted for a abdominal myomectomy of a 11 cm myoma.  Recently she has become increasingly symptomatic consistent with degeneratyion  She also wants her nexplanon removed from the right arm  Pt understands inherent risk of hysterectomy when a myomectomy is done  PMH:      Past Medical History:  Diagnosis Date  . BV (bacterial vaginosis)   . Pregnant   . Uterine fibroid in pregnancy   . UTI (urinary tract infection)   . Yeast infection   PSH:       Past Surgical History:  Procedure Laterality Date  . NO PAST SURGERIES    POb/GynH:          OB History    Gravida Para Term Preterm AB Living   3 2 1 1 1 3     SAB TAB Ectopic Multiple Live Births   1   1 3      SH:  Social History       Tobacco Use  . Smoking status: Never Smoker  . Smokeless tobacco: Never Used  Substance Use Topics  . Alcohol use: No  . Drug use: No  FH:       Family History  Problem Relation Age of Onset  . Hypertension Father   . Diabetes Maternal Grandmother   Allergies:       Allergies  Allergen Reactions  . Peanuts [Peanut Oil] Itching    Itching in throat.  Medications:  Current Outpatient Medications:  . etonogestrel (NEXPLANON) 68 MG IMPL implant, 1 each by Subdermal route once., Disp: , Rfl:  Review of Systems:  Review of Systems  Constitutional: Negative for fever, chills, weight loss, malaise/fatigue and diaphoresis.  HENT: Negative for hearing loss, ear pain, nosebleeds, congestion, sore throat, neck pain, tinnitus and ear discharge.  Eyes: Negative for blurred vision, double vision, photophobia, pain, discharge and redness.  Respiratory: Negative for cough, hemoptysis, sputum production, shortness of breath, wheezing and stridor.  Cardiovascular: Negative for chest pain, palpitations, orthopnea, claudication, leg swelling and PND.  Gastrointestinal: Positive  for abdominal pain. Negative for heartburn, nausea, vomiting, diarrhea, constipation, blood in stool and melena.  Genitourinary: Negative for dysuria, urgency, frequency, hematuria and flank pain.  Musculoskeletal: Negative for myalgias, back pain, joint pain and falls.  Skin: Negative for itching and rash.  Neurological: Negative for dizziness, tingling, tremors, sensory change, speech change, focal weakness, seizures, loss of consciousness, weakness and headaches.  Endo/Heme/Allergies: Negative for environmental allergies and polydipsia. Does not bruise/bleed easily.  Psychiatric/Behavioral: Negative for depression, suicidal ideas, hallucinations, memory loss and substance abuse. The patient is not nervous/anxious and does not have insomnia.  PHYSICAL EXAM:  Blood pressure 102/62, pulse 81, height 5' (1.524 m), weight 215 lb (97.5 kg), last menstrual period 03/15/2017.  Vitals reviewed.  Constitutional: She is oriented to person, place, and time. She appears well-developed and well-nourished.  HENT:  Head: Normocephalic and atraumatic.  Right Ear: External ear normal.  Left Ear: External ear normal.  Nose: Nose normal.  Mouth/Throat: Oropharynx is clear and moist.  Eyes: Conjunctivae and EOM are normal. Pupils are equal, round, and reactive to light. Right eye exhibits no discharge. Left eye exhibits no discharge. No scleral icterus.  Neck: Normal range of motion. Neck supple. No tracheal deviation present. No thyromegaly present.  Cardiovascular: Normal rate, regular rhythm, normal heart sounds and intact distal pulses. Exam reveals no gallop and  no friction rub.  No murmur heard.  Respiratory: Effort normal and breath sounds normal. No respiratory distress. She has no wheezes. She has no rales. She exhibits no tenderness.  GI: Soft. Bowel sounds are normal. She exhibits no distension and no mass. There is tenderness. There is no rebound and no guarding.  Genitourinary:  Vulva is normal  without lesions Vagina is pink moist without discharge Cervix normal in appearance and pap is normal Uterus is tender enlarged due to myoma Adnexa is negative with normal sized ovaries by sonogram  Musculoskeletal: Normal range of motion. She exhibits no edema and no tenderness.  Neurological: She is alert and oriented to person, place, and time. She has normal reflexes. She displays normal reflexes. No cranial nerve deficit. She exhibits normal muscle tone. Coordination normal.  Skin: Skin is warm and dry. No rash noted. No erythema. No pallor.  Psychiatric: She has a normal mood and affect. Her behavior is normal. Judgment and thought content normal.  Labs:        Results for orders placed or performed in visit on 03/25/17 (from the past 336 hour(s))  Cytology - PAP   Collection Time: 03/25/17 12:00 AM  Result Value Ref Range   Adequacy      Satisfactory for evaluation endocervical/transformation zone component ABSENT.   Diagnosis      NEGATIVE FOR INTRAEPITHELIAL LESIONS OR MALIGNANCY.   Chlamydia Negative    Neisseria gonorrhea Negative    Material Submitted CervicoVaginal Pap [ThinPrep Imaged]    CYTOLOGY - PAP PAP RESULT   HIV antibody   Collection Time: 03/25/17 11:08 AM  Result Value Ref Range   HIV Screen 4th Generation wRfx Non Reactive Non Reactive  RPR   Collection Time: 03/25/17 11:08 AM  Result Value Ref Range   RPR Ser Ql Non Reactive Non Reactive         Results for orders placed or performed during the hospital encounter of 04/24/17 (from the past 336 hour(s))  Prepare RBC   Collection Time: 04/15/17 11:12 AM  Result Value Ref Range   Order Confirmation ORDER PROCESSED BY BLOOD BANK   Results for orders placed or performed during the hospital encounter of 04/15/17 (from the past 336 hour(s))  CBC   Collection Time: 04/15/17 11:12 AM  Result Value Ref Range   WBC 7.1 4.0 - 10.5 K/uL   RBC 4.03 3.87 - 5.11 MIL/uL   Hemoglobin 12.6 12.0 - 15.0 g/dL   HCT  38.2 36.0 - 46.0 %   MCV 94.8 78.0 - 100.0 fL   MCH 31.3 26.0 - 34.0 pg   MCHC 33.0 30.0 - 36.0 g/dL   RDW 11.8 11.5 - 15.5 %   Platelets 236 150 - 400 K/uL  Comprehensive metabolic panel   Collection Time: 04/15/17 11:12 AM  Result Value Ref Range   Sodium 135 135 - 145 mmol/L   Potassium 3.4 (L) 3.5 - 5.1 mmol/L   Chloride 103 101 - 111 mmol/L   CO2 26 22 - 32 mmol/L   Glucose, Bld 92 65 - 99 mg/dL   BUN 12 6 - 20 mg/dL   Creatinine, Ser 0.69 0.44 - 1.00 mg/dL   Calcium 8.9 8.9 - 10.3 mg/dL   Total Protein 7.6 6.5 - 8.1 g/dL   Albumin 3.9 3.5 - 5.0 g/dL   AST 18 15 - 41 U/L   ALT 17 14 - 54 U/L   Alkaline Phosphatase 47 38 - 126 U/L   Total Bilirubin 0.5  0.3 - 1.2 mg/dL   GFR calc non Af Amer >60 >60 mL/min   GFR calc Af Amer >60 >60 mL/min   Anion gap 6 5 - 15  hCG, quantitative, pregnancy   Collection Time: 04/15/17 11:12 AM  Result Value Ref Range   hCG, Beta Chain, Quant, S <1 <5 mIU/mL  Rapid HIV screen (HIV 1/2 Ab+Ag)   Collection Time: 04/15/17 11:12 AM  Result Value Ref Range   HIV-1 P24 Antigen - HIV24 NON REACTIVE NON REACTIVE   HIV 1/2 Antibodies NON REACTIVE NON REACTIVE   Interpretation (HIV Ag Ab)      A non reactive test result means that HIV 1 or HIV 2 antibodies and HIV 1 p24 antigen were not detected in the specimen.  Urinalysis, Routine w reflex microscopic   Collection Time: 04/15/17 11:12 AM  Result Value Ref Range   Color, Urine YELLOW YELLOW   APPearance CLEAR CLEAR   Specific Gravity, Urine 1.025 1.005 - 1.030   pH 6.0 5.0 - 8.0   Glucose, UA NEGATIVE NEGATIVE mg/dL   Hgb urine dipstick NEGATIVE NEGATIVE   Bilirubin Urine NEGATIVE NEGATIVE   Ketones, ur NEGATIVE NEGATIVE mg/dL   Protein, ur NEGATIVE NEGATIVE mg/dL   Nitrite NEGATIVE NEGATIVE   Leukocytes, UA NEGATIVE NEGATIVE  Type and screen   Collection Time: 04/15/17 11:12 AM  Result Value Ref Range   ABO/RH(D) O POS    Antibody Screen NEG    Sample Expiration 04/29/2017    Extend  sample reason NO TRANSFUSIONS OR PREGNANCY IN THE PAST 3 MONTHS    Unit Number [id number]    Blood Component Type RED CELLS,LR    Unit division 00    Status of Unit ALLOCATED    Transfusion Status OK TO TRANSFUSE    Crossmatch Result Compatible    Unit Number [id number]    Blood Component Type RED CELLS,LR    Unit division 00    Status of Unit ALLOCATED    Transfusion Status OK TO TRANSFUSE    Crossmatch Result Compatible   BPAM RBC   Collection Time: 04/15/17 11:12 AM  Result Value Ref Range   Blood Product Unit Number [id number]    Unit Type and Rh 5100    Blood Product Expiration Date 709628366294    Blood Product Unit Number [id number]    Unit Type and Rh 5100    Blood Product Expiration Date 812751700174    EKG:     Orders placed or performed during the hospital encounter of 11/16/11  . ED EKG  . ED EKG  . EKG 12-Lead  . EKG 12-Lead  . EKG  Imaging Studies:  CLINICAL DATA: Left lower abdominal pain for 1 month.  EXAM:  CT ABDOMEN AND PELVIS WITH CONTRAST  TECHNIQUE:  Multidetector CT imaging of the abdomen and pelvis was performed  using the standard protocol following bolus administration of  intravenous contrast.  CONTRAST: 143mL ISOVUE-300 IOPAMIDOL (ISOVUE-300) INJECTION 61%  COMPARISON: Ob ultrasound exam 06/05/2014.  FINDINGS:  Lower chest: 3 mm left lower lobe pulmonary nodule (image 16 series  4).  Hepatobiliary: No focal abnormality within the liver parenchyma.  Gallbladder decompressed. No intrahepatic or extrahepatic biliary  dilation.  Pancreas: No focal mass lesion. No dilatation of the main duct. No  intraparenchymal cyst. No peripancreatic edema.  Spleen: No splenomegaly. No focal mass lesion.  Adrenals/Urinary Tract: No adrenal nodule or mass. Kidneys  unremarkable. No evidence for hydroureter. The urinary bladder  appears normal for the degree  of distention.  Stomach/Bowel: Tiny hiatal hernia. Stomach otherwise unremarkable.  No  small bowel wall thickening. No small bowel dilatation. The  terminal ileum is normal. The appendix is normal. No gross colonic  mass. No colonic wall thickening. No substantial diverticular  change.  Vascular/Lymphatic: No abdominal aortic aneurysm. No abdominal  aortic atherosclerotic calcification. There is no gastrohepatic or  hepatoduodenal ligament lymphadenopathy. No intraperitoneal or  retroperitoneal lymphadenopathy. No pelvic sidewall lymphadenopathy.  Reproductive: Uterus measures 18.7 x 10.1 x 11.4 cm. A large  posterior fundal fibroid measures 10.3 x 8.6 x 10.8 cm which  compares to ultrasound measurements obtained previously of 6.5 x 9.3  x 6.6 cm. There is no adnexal mass.  Other: No intraperitoneal free fluid.  Musculoskeletal: Bone windows reveal no worrisome lytic or sclerotic  osseous lesions.  IMPRESSION:  1. No acute findings in the abdomen or pelvis.  2. Large posterior fundal fibroid measures up to 10.8 cm and results  inuterine enlargement with top of fibroid extending up just cranial  to the umbilicus.  3. 3 mm left lower lobe pulmonary nodule. No follow-up needed if  patient is low-risk. Non-contrast chest CT can be considered in 12  months if patient is high-risk. This recommendation follows the  consensus statement: Guidelines for Management of Incidental  Pulmonary Nodules Detected on CT Images: From the Fleischner Society  2017; Radiology 2017; 284:228-243.  Assessment:  11 cm uterine myoma causing increasing pain, probable degenerating myoma      Patient Active Problem List   Diagnosis Date Noted  . Dysmenorrhea 03/25/2017  . Menometrorrhagia 03/25/2017  . Moody 03/25/2017  . Nexplanon in place 03/25/2017  . Enlarged uterus 03/25/2017  . Encounter for gynecological examination with Papanicolaou smear of cervix 01/25/2015  . Preterm delivery, delivered 01/04/2015  . H/O cesarean section 01/04/2015  . Fibroids 10/21/2012  Plan:  Abdominal  myomectomy removal of nexplanon 04/24/2017  Pt understands the risks of surgery including but not limited t excessive bleeding requiring transfusion or reoperation, post-operative infection requiring prolonged hospitalization or re-hospitalization and antibiotic therapy, and damage to other organs including bladder, bowel, ureters and major vessels. The patient also understands the alternative treatment options which were discussed in full. All questions were answered.   Martha Soltys H 04/04/2017 11:19 AM     Face to face time:  25 minutes  Greater than 50% of the visit time was spent in counseling and coordination of care with the patient.  The summary and outline of the counseling and care coordination is summarized in the note above.   All questions were answered.

## 2017-04-12 NOTE — Patient Instructions (Signed)
Jacqueline Underwood  04/12/2017     @PREFPERIOPPHARMACY @   Your procedure is scheduled on  04/24/2017 .  Report to [facility] at  900  A.M.  Call this number if you have problems the morning of surgery:  [phone]   Remember:  Do not eat food or drink liquids after midnight.  Take these medicines the morning of surgery with A SIP OF WATER  None   Do not wear jewelry, make-up or nail polish.  Do not wear lotions, powders, or perfumes, or deoderant.  Do not shave 48 hours prior to surgery.  Men may shave face and neck.  Do not bring valuables to the hospital.  [facility] is not responsible for any belongings or valuables.  Contacts, dentures or bridgework may not be worn into surgery.  Leave your suitcase in the car.  After surgery it may be brought to your room.  For patients admitted to the hospital, discharge time will be determined by your treatment team.  Patients discharged the day of surgery will not be allowed to drive home.   Name and phone number of your driver:   family Special instructions:  None  Please read over the following fact sheets that you were given. Anesthesia Post-op Instructions and Care and Recovery After Surgery       Incision Care, Adult An incision is a cut that a doctor makes in your skin for surgery (for a procedure). Most times, these cuts are closed after surgery. Your cut from surgery may be closed with stitches (sutures), staples, skin glue, or skin tape (adhesive strips). You may need to return to your doctor to have stitches or staples taken out. This may happen many days or many weeks after your surgery. The cut needs to be well cared for so it does not get infected. How to care for your cut Cut care  Follow instructions from your doctor about how to take care of your cut. Make sure you: ? Wash your hands with soap and water before you change your bandage (dressing). If you cannot use soap and water, use hand  sanitizer. ? Change your bandage as told by your doctor. ? Leave stitches, skin glue, or skin tape in place. They may need to stay in place for 2 weeks or longer. If tape strips get loose and curl up, you may trim the loose edges. Do not remove tape strips completely unless your doctor says it is okay.  Check your cut area every day for signs of infection. Check for: ? More redness, swelling, or pain. ? More fluid or blood. ? Warmth. ? Pus or a bad smell.  Ask your doctor how to clean the cut. This may include: ? Using mild soap and water. ? Using a clean towel to pat the cut dry after you clean it. ? Putting a cream or ointment on the cut. Do this only as told by your doctor. ? Covering the cut with a clean bandage.  Ask your doctor when you can leave the cut uncovered.  Do not take baths, swim, or use a hot tub until your doctor says it is okay. Ask your doctor if you can take showers. You may only be allowed to take sponge baths for bathing. Medicines  If you were prescribed an antibiotic medicine, cream, or ointment, take the antibiotic or put it on the cut as told by your doctor. Do not stop taking  or putting on the antibiotic even if your condition gets better.  Take over-the-counter and prescription medicines only as told by your doctor. General instructions  Limit movement around your cut. This helps healing. ? Avoid straining, lifting, or exercise for the first month, or for as long as told by your doctor. ? Follow instructions from your doctor about going back to your normal activities. ? Ask your doctor what activities are safe.  Protect your cut from the sun when you are outside for the first 6 months, or for as long as told by your doctor. Put on sunscreen around the scar or cover up the scar.  Keep all follow-up visits as told by your doctor. This is important. Contact a doctor if:  Your have more redness, swelling, or pain around the cut.  You have more fluid or  blood coming from the cut.  Your cut feels warm to the touch.  You have pus or a bad smell coming from the cut.  You have a fever or shaking chills.  You feel sick to your stomach (nauseous) or you throw up (vomit).  You are dizzy.  Your stitches or staples come undone. Get help right away if:  You have a red streak coming from your cut.  Your cut bleeds through the bandage and the bleeding does not stop with gentle pressure.  The edges of your cut open up and separate.  You have very bad (severe) pain.  You have a rash.  You are confused.  You pass out (faint).  You have trouble breathing and you have a fast heartbeat. This information is not intended to replace advice given to you by your health care provider. Make sure you discuss any questions you have with your health care provider. Document Released: 08/06/2011 Document Revised: 01/20/2016 Document Reviewed: 01/20/2016 Elsevier Interactive Patient Education  2017 [employer]  Myomectomy Myomectomy is surgery to remove a noncancerous tumor (myoma) from the uterus. Myomas are tumors made up of fibrous tissue. They are often called fibroid tumors. Fibroid tumors can range from the size of a pea to the size of a grapefruit. In a myomectomy, the fibroid tumor is removed without removing the uterus. Because these tumors are rarely cancerous, this surgery is usually done only if the tumor is growing or causing symptoms such as pain, pressure, bleeding, or pain with intercourse. LET [facility] CARE PROVIDER KNOW ABOUT:  Any allergies you have.  All medicines you are taking, including vitamins, herbs, eye drops, creams, and over-the-counter medicines.  Previous problems you or members of your family have had with the use of anesthetics.  Any blood disorders you have.  Previous surgeries you have had.  Medical conditions you have. RISKS AND COMPLICATIONS Generally, this is a safe procedure. However, as with any  procedure, complications can occur. Possible complications include:  Excessive bleeding.  Infection.  Injury to nearby organs.  Blood clots in the legs, chest, or brain.  Scar tissue on other organs and in the pelvis. This may require another surgery to remove the scar tissue.  BEFORE THE PROCEDURE  Ask your health care provider about changing or stopping your regular medicines. Avoid taking aspirin or blood thinners as directed by your health care provider.  Do not  eat or drink anything after midnight on the night before surgery.  If you smoke, do not  smoke for 2 weeks before the surgery.  Do not  drink alcohol the day before the surgery.  Arrange for someone  to drive you home after the procedure or after your hospital stay. Also arrange for someone to help you with activities during your recovery. PROCEDURE You will be given medicine to make you sleep through the procedure (general anesthetic). Any of the following methods may be used to perform a myomectomy:  Small monitors will be put on your body. They are used to check your heart, blood pressure, and oxygen level.  An IV access tube will be put into one of your veins. Medicine will be able to flow directly into your body through this IV tube.  You might be given a medicine to help you relax (sedative).  You will be given a medicine to make you sleep (general anesthetic). A breathing tube will be placed into your lungs during the procedure.  A thin, flexible tube (catheter) will be inserted into your bladder to collect urine.  Any of the following methods may be used to perform a myomectomy: ? Hysteroscopic myomectomy-This method may be used when the fibroid tumor is inside the cavity of the uterus. A long, thin tube that is like a telescope (hysteroscope) is inserted inside the uterus. A saline solution is put into your uterus. This expands the uterus and allows the surgeon to see the fibroids. Tools are passed through  the hysteroscope to remove the fibroid tumor in pieces. ? Laparoscopic myomectomy-A few small cuts (incisions) are made in the lower abdomen. A thin, lighted tube with a tiny camera on the end (laparoscope) is inserted through one of the incisions. This gives the surgeon a good view of the area. The fibroid tumor is removed through the other incisions. The incisions are then closed with stitches (sutures) or staples. ? Abdominal myomectomy-This method is used when the fibroid tumor cannot be removed with a hysteroscope or laparoscope. The surgery is performed through a larger surgical incision in the abdomen. The fibroid tumor is removed through this incision. The incision is closed with sutures or staples.  What to expect after the procedure  If you had a laparoscopic or hysteroscopic myomectomy, you may be able to go home the same day, or you may need to stay in the hospital overnight.  If you had an abdominal myomectomy, you may need to stay in the hospital for a few days.  Your IV access tube and catheter will be removed in 1-2 days.  You may be given medicine for pain or to help you sleep.  You may be given an antibiotic medicine, if needed. This information is not intended to replace advice given to you by your health care provider. Make sure you discuss any questions you have with your health care provider. Document Released: 03/11/2007 Document Revised: 10/20/2015 Document Reviewed: 12/24/2012 Elsevier Interactive Patient Education  2017 [place]. Myomectomy, Care After Refer to this sheet in the next few weeks. These instructions provide you with information on caring for yourself after your procedure. Your health care provider may also give you more specific instructions. Your treatment has been planned according to current medical practices, but problems sometimes occur. Call your health care provider if you have any problems or questions after your procedure. What can I expect  after the procedure? After your procedure, it is typical to have the following:  Pain in your abdomen, especially at any incision sites. You will be given pain medicine to control the pain.  Tiredness. This is a normal part of the recovery process. Your energy level will return to normal over the next several  weeks.  Constipation.  Vaginal bleeding. This is normal and should stop after 1-2 weeks.  Follow these instructions at home:  Only take over-the-counter or prescription medicines as directed by your health care provider. Avoid aspirin because it can cause bleeding.  Do not douche, use tampons, or have sexual intercourse until given permission by your health care provider.  Remove or change any bandages (dressings) as directed by your health care provider.  Take showers instead of baths as directed by your health care provider.  You will probably be able to go back to your normal routine after a few days. Do not do anything that requires extra effort until your health care provider says it is okay. Do not lift anything heavier than 15 pounds (6.8 kg) until your health care provider approves.  Walk daily but take frequent rest breaks if you tire easily.  Continue to practice deep breathing and coughing. If it hurts to cough, try holding a pillow against your belly as you cough.  If you become constipated, you may: ? Use a mild laxative if your health care provider approves. ? Add more fruit and bran to your diet. ? Drink enough fluids to keep your urine clear or pale yellow.  Take your temperature twice a day and write it down.  Do not drink alcohol.  Do not drive until your health care provider approves.  Have someone help you at home for 1 week or until you can do your own household activities.  Follow up with your health care provider as directed. Contact a health care provider if:  You have a fever.  You have increasing abdominal pain that is not relieved with  medicine.  You have nausea, vomiting, or diarrhea.  You have pain when you urinate, or you have blood in your urine.  You have a rash on your body.  You have pain or redness where your IV access tube was inserted.  You have redness, swelling, or any kind of drainage from an incision. Get help right away if:  You have weakness or lightheadedness.  You have pain, swelling, or redness in your legs.  You have chest pain.  You faint.  You have shortness of breath.  You have heavy vaginal bleeding.  Your incision is opening up. This information is not intended to replace advice given to you by your health care provider. Make sure you discuss any questions you have with your health care provider. Document Released: 10/04/2010 Document Revised: 10/20/2015 Document Reviewed: 12/24/2012 Elsevier Interactive Patient Education  2017 [place] Anesthesia, Adult General anesthesia is the use of medicines to make a person "go to sleep" (be unconscious) for a medical procedure. General anesthesia is often recommended when a procedure:  Is long.  Requires you to be still or in an unusual position.  Is major and can cause you to lose blood.  Is impossible to do without general anesthesia.  The medicines used for general anesthesia are called general anesthetics. In addition to making you sleep, the medicines:  Prevent pain.  Control your blood pressure.  Relax your muscles.  Tell a health care provider about:  Any allergies you have.  All medicines you are taking, including vitamins, herbs, eye drops, creams, and over-the-counter medicines.  Any problems you or family members have had with anesthetic medicines.  Types of anesthetics you have had in the past.  Any bleeding disorders you have.  Any surgeries you have had.  Any medical conditions you have.  Any history of heart or lung conditions, such as heart failure, sleep apnea, or chronic obstructive  pulmonary disease (COPD).  Whether you are pregnant or may be pregnant.  Whether you use tobacco, alcohol, marijuana, or street drugs.  Any history of Armed forces logistics/support/administrative officer.  Any history of depression or anxiety. What are the risks? Generally, this is a safe procedure. However, problems may occur, including:  Allergic reaction to anesthetics.  Lung and heart problems.  Inhaling food or liquids from your stomach into your lungs (aspiration).  Injury to nerves.  Waking up during your procedure and being unable to move (rare).  Extreme agitation or a state of mental confusion (delirium) when you wake up from the anesthetic.  Air in the bloodstream, which can lead to stroke.  These problems are more likely to develop if you are having a major surgery or if you have an advanced medical condition. You can prevent some of these complications by answering all of your health care provider's questions thoroughly and by following all pre-procedure instructions. General anesthesia can cause side effects, including:  Nausea or vomiting  A sore throat from the breathing tube.  Feeling cold or shivery.  Feeling tired, washed out, or achy.  Sleepiness or drowsiness.  Confusion or agitation.  What happens before the procedure? Staying hydrated Follow instructions from your health care provider about hydration, which may include:  Up to 2 hours before the procedure - you may continue to drink clear liquids, such as water, clear fruit juice, black coffee, and plain tea.  Eating and drinking restrictions Follow instructions from your health care provider about eating and drinking, which may include:  8 hours before the procedure - stop eating heavy meals or foods such as meat, fried foods, or fatty foods.  6 hours before the procedure - stop eating light meals or foods, such as toast or cereal.  6 hours before the procedure - stop drinking milk or drinks that contain milk.  2 hours  before the procedure - stop drinking clear liquids.  Medicines  Ask your health care provider about: ? Changing or stopping your regular medicines. This is especially important if you are taking diabetes medicines or blood thinners. ? Taking medicines such as aspirin and ibuprofen. These medicines can thin your blood. Do not take these medicines before your procedure if your health care provider instructs you not to. ? Taking new dietary supplements or medicines. Do not take these during the week before your procedure unless your health care provider approves them.  If you are told to take a medicine or to continue taking a medicine on the day of the procedure, take the medicine with sips of water. General instructions   Ask if you will be going home the same day, the following day, or after a longer hospital stay. ? Plan to have someone take you home. ? Plan to have someone stay with you for the first 24 hours after you leave the hospital or clinic.  For 3-6 weeks before the procedure, try not to use any tobacco products, such as cigarettes, chewing tobacco, and e-cigarettes.  You may brush your teeth on the morning of the procedure, but make sure to spit out the toothpaste. What happens during the procedure?  You will be given anesthetics through a mask and through an IV tube in one of your veins.  You may receive medicine to help you relax (sedative).  As soon as you are asleep, a breathing tube may be used  to help you breathe.  An anesthesia specialist will stay with you throughout the procedure. He or she will help keep you comfortable and safe by continuing to give you medicines and adjusting the amount of medicine that you get. He or she will also watch your blood pressure, pulse, and oxygen levels to make sure that the anesthetics do not cause any problems.  If a breathing tube was used to help you breathe, it will be removed before you wake up. The procedure may vary among  health care providers and hospitals. What happens after the procedure?  You will wake up, often slowly, after the procedure is complete, usually in a recovery area.  Your blood pressure, heart rate, breathing rate, and blood oxygen level will be monitored until the medicines you were given have worn off.  You may be given medicine to help you calm down if you feel anxious or agitated.  If you will be going home the same day, your health care provider may check to make sure you can stand, drink, and urinate.  Your health care providers will treat your pain and side effects before you go home.  Do not drive for 24 hours if you received a sedative.  You may: ? Feel nauseous and vomit. ? Have a sore throat. ? Have mental slowness. ? Feel cold or shivery. ? Feel sleepy. ? Feel tired. ? Feel sore or achy, even in parts of your body where you did not have surgery. This information is not intended to replace advice given to you by your health care provider. Make sure you discuss any questions you have with your health care provider. Document Released: 08/21/2007 Document Revised: 10/25/2015 Document Reviewed: 04/28/2015 Elsevier Interactive Patient Education  2018 [place] Anesthesia, Adult, Care After These instructions provide you with information about caring for yourself after your procedure. Your health care provider may also give you more specific instructions. Your treatment has been planned according to current medical practices, but problems sometimes occur. Call your health care provider if you have any problems or questions after your procedure. What can I expect after the procedure? After the procedure, it is common to have:  Vomiting.  A sore throat.  Mental slowness.  It is common to feel:  Nauseous.  Cold or shivery.  Sleepy.  Tired.  Sore or achy, even in parts of your body where you did not have surgery.  Follow these instructions at home: For  at least 24 hours after the procedure:  Do not: ? Participate in activities where you could fall or become injured. ? Drive. ? Use heavy machinery. ? Drink alcohol. ? Take sleeping pills or medicines that cause drowsiness. ? Make important decisions or sign legal documents. ? Take care of children on your own.  Rest. Eating and drinking  If you vomit, drink water, juice, or soup when you can drink without vomiting.  Drink enough fluid to keep your urine clear or pale yellow.  Make sure you have little or no nausea before eating solid foods.  Follow the diet recommended by your health care provider. General instructions  Have a responsible adult stay with you until you are awake and alert.  Return to your normal activities as told by your health care provider. Ask your health care provider what activities are safe for you.  Take over-the-counter and prescription medicines only as told by your health care provider.  If you smoke, do not smoke without supervision.  Keep all follow-up  visits as told by your health care provider. This is important. Contact a health care provider if:  You continue to have nausea or vomiting at home, and medicines are not helpful.  You cannot drink fluids or start eating again.  You cannot urinate after 8-12 hours.  You develop a skin rash.  You have fever.  You have increasing redness at the site of your procedure. Get help right away if:  You have difficulty breathing.  You have chest pain.  You have unexpected bleeding.  You feel that you are having a life-threatening or urgent problem. This information is not intended to replace advice given to you by your health care provider. Make sure you discuss any questions you have with your health care provider. Document Released: 08/20/2000 Document Revised: 10/17/2015 Document Reviewed: 04/28/2015 Elsevier Interactive Patient Education  [employer].

## 2017-04-15 ENCOUNTER — Encounter (HOSPITAL_COMMUNITY)
Admission: RE | Admit: 2017-04-15 | Discharge: 2017-04-15 | Disposition: A | Discharge status: Home / Self Care | Payer: BLUE CROSS/BLUE SHIELD | Source: Ambulatory Visit | Attending: Obstetrics & Gynecology | Admitting: Obstetrics & Gynecology

## 2017-04-15 ENCOUNTER — Encounter (HOSPITAL_COMMUNITY): Payer: Self-pay

## 2017-04-15 ENCOUNTER — Other Ambulatory Visit: Payer: Self-pay | Admitting: Obstetrics & Gynecology

## 2017-04-15 ENCOUNTER — Other Ambulatory Visit: Payer: Self-pay

## 2017-04-15 DIAGNOSIS — N852 Hypertrophy of uterus: Secondary | ICD-10-CM | POA: Diagnosis not present

## 2017-04-15 DIAGNOSIS — N946 Dysmenorrhea, unspecified: Secondary | ICD-10-CM | POA: Diagnosis not present

## 2017-04-15 DIAGNOSIS — N921 Excessive and frequent menstruation with irregular cycle: Secondary | ICD-10-CM | POA: Insufficient documentation

## 2017-04-15 DIAGNOSIS — Z01812 Encounter for preprocedural laboratory examination: Secondary | ICD-10-CM | POA: Insufficient documentation

## 2017-04-15 HISTORY — DX: Excessive and frequent menstruation with regular cycle: N92.0

## 2017-04-15 HISTORY — DX: Leiomyoma of uterus, unspecified: D25.9

## 2017-04-15 LAB — COMPREHENSIVE METABOLIC PANEL
ALT: 17 U/L (ref 14–54)
ANION GAP: 6 (ref 5–15)
AST: 18 U/L (ref 15–41)
Albumin: 3.9 g/dL (ref 3.5–5.0)
Alkaline Phosphatase: 47 U/L (ref 38–126)
BUN: 12 mg/dL (ref 6–20)
CHLORIDE: 103 mmol/L (ref 101–111)
CO2: 26 mmol/L (ref 22–32)
Calcium: 8.9 mg/dL (ref 8.9–10.3)
Creatinine, Ser: 0.69 mg/dL (ref 0.44–1.00)
Glucose, Bld: 92 mg/dL (ref 65–99)
POTASSIUM: 3.4 mmol/L — AB (ref 3.5–5.1)
SODIUM: 135 mmol/L (ref 135–145)
Total Bilirubin: 0.5 mg/dL (ref 0.3–1.2)
Total Protein: 7.6 g/dL (ref 6.5–8.1)

## 2017-04-15 LAB — URINALYSIS, ROUTINE W REFLEX MICROSCOPIC
BILIRUBIN URINE: NEGATIVE
GLUCOSE, UA: NEGATIVE mg/dL
Hgb urine dipstick: NEGATIVE
KETONES UR: NEGATIVE mg/dL
LEUKOCYTES UA: NEGATIVE
Nitrite: NEGATIVE
PROTEIN: NEGATIVE mg/dL
Specific Gravity, Urine: 1.025 (ref 1.005–1.030)
pH: 6 (ref 5.0–8.0)

## 2017-04-15 LAB — CBC
HEMATOCRIT: 38.2 % (ref 36.0–46.0)
HEMOGLOBIN: 12.6 g/dL (ref 12.0–15.0)
MCH: 31.3 pg (ref 26.0–34.0)
MCHC: 33 g/dL (ref 30.0–36.0)
MCV: 94.8 fL (ref 78.0–100.0)
Platelets: 236 10*3/uL (ref 150–400)
RBC: 4.03 MIL/uL (ref 3.87–5.11)
RDW: 11.8 % (ref 11.5–15.5)
WBC: 7.1 10*3/uL (ref 4.0–10.5)

## 2017-04-15 LAB — HCG, QUANTITATIVE, PREGNANCY

## 2017-04-15 LAB — RAPID HIV SCREEN (HIV 1/2 AB+AG)
HIV 1/2 ANTIBODIES: NONREACTIVE
HIV-1 P24 ANTIGEN - HIV24: NONREACTIVE

## 2017-04-17 ENCOUNTER — Inpatient Hospital Stay (HOSPITAL_COMMUNITY)
Admission: RE | Admit: 2017-04-17 | Discharge: 2017-04-17 | Disposition: A | Discharge status: Home / Self Care | Payer: BLUE CROSS/BLUE SHIELD | Source: Ambulatory Visit

## 2017-04-24 ENCOUNTER — Inpatient Hospital Stay (HOSPITAL_COMMUNITY)
Admission: AD | Admit: 2017-04-24 | Discharge: 2017-04-25 | DRG: 742 | Disposition: A | Discharge status: Home / Self Care | Payer: BLUE CROSS/BLUE SHIELD | Source: Ambulatory Visit | Attending: Obstetrics & Gynecology | Admitting: Obstetrics & Gynecology

## 2017-04-24 ENCOUNTER — Encounter (HOSPITAL_COMMUNITY)
Admission: AD | Disposition: A | Discharge status: Home / Self Care | Payer: Self-pay | Source: Ambulatory Visit | Attending: Obstetrics & Gynecology

## 2017-04-24 ENCOUNTER — Ambulatory Visit (HOSPITAL_COMMUNITY): Payer: BLUE CROSS/BLUE SHIELD | Admitting: Anesthesiology

## 2017-04-24 ENCOUNTER — Other Ambulatory Visit: Payer: Self-pay

## 2017-04-24 ENCOUNTER — Encounter (HOSPITAL_COMMUNITY): Payer: Self-pay

## 2017-04-24 ENCOUNTER — Encounter: Payer: Self-pay | Admitting: Obstetrics & Gynecology

## 2017-04-24 DIAGNOSIS — D259 Leiomyoma of uterus, unspecified: Principal | ICD-10-CM | POA: Diagnosis present

## 2017-04-24 DIAGNOSIS — Z6841 Body Mass Index (BMI) 40.0 and over, adult: Secondary | ICD-10-CM | POA: Diagnosis not present

## 2017-04-24 DIAGNOSIS — Z9889 Other specified postprocedural states: Secondary | ICD-10-CM

## 2017-04-24 DIAGNOSIS — Z3049 Encounter for surveillance of other contraceptives: Secondary | ICD-10-CM | POA: Diagnosis not present

## 2017-04-24 DIAGNOSIS — N921 Excessive and frequent menstruation with irregular cycle: Secondary | ICD-10-CM | POA: Diagnosis present

## 2017-04-24 DIAGNOSIS — Z91010 Allergy to peanuts: Secondary | ICD-10-CM | POA: Diagnosis not present

## 2017-04-24 DIAGNOSIS — Z98891 History of uterine scar from previous surgery: Secondary | ICD-10-CM

## 2017-04-24 DIAGNOSIS — R109 Unspecified abdominal pain: Secondary | ICD-10-CM

## 2017-04-24 DIAGNOSIS — Z793 Long term (current) use of hormonal contraceptives: Secondary | ICD-10-CM

## 2017-04-24 DIAGNOSIS — R911 Solitary pulmonary nodule: Secondary | ICD-10-CM | POA: Diagnosis present

## 2017-04-24 HISTORY — DX: Other specified postprocedural states: Z98.890

## 2017-04-24 HISTORY — PX: MYOMECTOMY: SHX85

## 2017-04-24 HISTORY — PX: REMOVAL OF NON VAGINAL CONTRACEPTIVE DEVICE: SHX6219

## 2017-04-24 LAB — PREPARE RBC (CROSSMATCH)

## 2017-04-24 SURGERY — MYOMECTOMY, ABDOMINAL APPROACH
Anesthesia: General | Site: Arm Upper | Laterality: Right

## 2017-04-24 MED ORDER — ONDANSETRON HCL 4 MG/2ML IJ SOLN
INTRAMUSCULAR | Status: AC
  Filled 2017-04-24: qty 2

## 2017-04-24 MED ORDER — OXYCODONE-ACETAMINOPHEN 5-325 MG PO TABS
1.0000 | ORAL_TABLET | ORAL | Status: DC | PRN
  Administered 2017-04-24: 1 via ORAL
  Administered 2017-04-25: 2 via ORAL
  Filled 2017-04-24 (×2): qty 2

## 2017-04-24 MED ORDER — KETOROLAC TROMETHAMINE 30 MG/ML IJ SOLN
30.0000 mg | Freq: Once | INTRAMUSCULAR | Status: AC
  Administered 2017-04-24: 30 mg via INTRAVENOUS

## 2017-04-24 MED ORDER — BUPIVACAINE LIPOSOME 1.3 % IJ SUSP
20.0000 mL | Freq: Once | INTRAMUSCULAR | Status: DC
  Filled 2017-04-24: qty 20

## 2017-04-24 MED ORDER — PROMETHAZINE HCL 25 MG/ML IJ SOLN: 25.0000 mg | Freq: Four times a day (QID) | INTRAMUSCULAR | Status: DC | PRN

## 2017-04-24 MED ORDER — NEOSTIGMINE METHYLSULFATE 10 MG/10ML IV SOLN
INTRAVENOUS | Status: DC | PRN
  Administered 2017-04-24: 3 mg via INTRAVENOUS

## 2017-04-24 MED ORDER — MIDAZOLAM HCL 2 MG/2ML IJ SOLN
INTRAMUSCULAR | Status: AC
  Filled 2017-04-24: qty 2

## 2017-04-24 MED ORDER — SIMETHICONE 80 MG PO CHEW: 80.0000 mg | CHEWABLE_TABLET | Freq: Four times a day (QID) | ORAL | Status: DC | PRN

## 2017-04-24 MED ORDER — LIDOCAINE HCL (CARDIAC) 10 MG/ML IV SOLN
INTRAVENOUS | Status: DC | PRN
  Administered 2017-04-24: 50 mg via INTRAVENOUS

## 2017-04-24 MED ORDER — FENTANYL CITRATE (PF) 250 MCG/5ML IJ SOLN
INTRAMUSCULAR | Status: AC
  Filled 2017-04-24: qty 5

## 2017-04-24 MED ORDER — HYDROMORPHONE HCL 1 MG/ML IJ SOLN
1.0000 mg | INTRAMUSCULAR | Status: DC | PRN
  Administered 2017-04-24: 1 mg via INTRAVENOUS
  Filled 2017-04-24: qty 1

## 2017-04-24 MED ORDER — 0.9 % SODIUM CHLORIDE (POUR BTL) OPTIME
TOPICAL | Status: DC | PRN
  Administered 2017-04-24: 2000 mL

## 2017-04-24 MED ORDER — DOCUSATE SODIUM 100 MG PO CAPS
100.0000 mg | ORAL_CAPSULE | Freq: Two times a day (BID) | ORAL | Status: DC
  Administered 2017-04-24 – 2017-04-25 (×2): 100 mg via ORAL
  Filled 2017-04-24 (×2): qty 1

## 2017-04-24 MED ORDER — KCL IN DEXTROSE-NACL 20-5-0.45 MEQ/L-%-% IV SOLN
INTRAVENOUS | Status: DC
  Administered 2017-04-24 – 2017-04-25 (×2): via INTRAVENOUS

## 2017-04-24 MED ORDER — PROPOFOL 10 MG/ML IV BOLUS
INTRAVENOUS | Status: DC | PRN
Start: 2017-04-24 — End: 2017-04-24
  Administered 2017-04-24: 50 mg via INTRAVENOUS
  Administered 2017-04-24: 150 mg via INTRAVENOUS

## 2017-04-24 MED ORDER — BISACODYL 10 MG RE SUPP: 10.0000 mg | Freq: Every day | RECTAL | Status: DC | PRN

## 2017-04-24 MED ORDER — GLYCOPYRROLATE 0.2 MG/ML IJ SOLN
INTRAMUSCULAR | Status: DC | PRN
  Administered 2017-04-24: 0.6 mg via INTRAVENOUS

## 2017-04-24 MED ORDER — DIPHENHYDRAMINE HCL 50 MG/ML IJ SOLN
INTRAMUSCULAR | Status: AC
  Filled 2017-04-24: qty 1

## 2017-04-24 MED ORDER — GI COCKTAIL ~~LOC~~
30.0000 mL | Freq: Once | ORAL | Status: AC
  Administered 2017-04-24: 30 mL via ORAL
  Filled 2017-04-24: qty 30

## 2017-04-24 MED ORDER — MIDAZOLAM HCL 2 MG/2ML IJ SOLN
1.0000 mg | INTRAMUSCULAR | Status: DC
  Administered 2017-04-24: 2 mg via INTRAVENOUS

## 2017-04-24 MED ORDER — SUCCINYLCHOLINE CHLORIDE 20 MG/ML IJ SOLN
INTRAMUSCULAR | Status: DC | PRN
  Administered 2017-04-24: 140 mg via INTRAVENOUS

## 2017-04-24 MED ORDER — ROCURONIUM BROMIDE 100 MG/10ML IV SOLN
INTRAVENOUS | Status: DC | PRN
  Administered 2017-04-24: 5 mg via INTRAVENOUS
  Administered 2017-04-24: 45 mg via INTRAVENOUS

## 2017-04-24 MED ORDER — CEFAZOLIN SODIUM-DEXTROSE 2-4 GM/100ML-% IV SOLN
INTRAVENOUS | Status: AC
  Filled 2017-04-24: qty 100

## 2017-04-24 MED ORDER — GLYCOPYRROLATE 0.2 MG/ML IJ SOLN
INTRAMUSCULAR | Status: AC
  Filled 2017-04-24: qty 3

## 2017-04-24 MED ORDER — BUPIVACAINE LIPOSOME 1.3 % IJ SUSP
INTRAMUSCULAR | Status: AC
  Filled 2017-04-24: qty 20

## 2017-04-24 MED ORDER — LACTATED RINGERS IV SOLN
INTRAVENOUS | Status: DC
  Administered 2017-04-24: 10:00:00 via INTRAVENOUS

## 2017-04-24 MED ORDER — SENNOSIDES-DOCUSATE SODIUM 8.6-50 MG PO TABS: 1.0000 | ORAL_TABLET | Freq: Every evening | ORAL | Status: DC | PRN

## 2017-04-24 MED ORDER — ZOLPIDEM TARTRATE 5 MG PO TABS: 5.0000 mg | ORAL_TABLET | Freq: Every evening | ORAL | Status: DC | PRN

## 2017-04-24 MED ORDER — SODIUM CHLORIDE 0.9 % IV SOLN
INTRAVENOUS | Status: DC | PRN
  Administered 2017-04-24: 10:00:00 via INTRAVENOUS

## 2017-04-24 MED ORDER — KETOROLAC TROMETHAMINE 30 MG/ML IJ SOLN
INTRAMUSCULAR | Status: AC
  Filled 2017-04-24: qty 1

## 2017-04-24 MED ORDER — ONDANSETRON HCL 4 MG/2ML IJ SOLN
4.0000 mg | Freq: Once | INTRAMUSCULAR | Status: AC
  Administered 2017-04-24: 4 mg via INTRAVENOUS

## 2017-04-24 MED ORDER — KETOROLAC TROMETHAMINE 30 MG/ML IJ SOLN
30.0000 mg | Freq: Three times a day (TID) | INTRAMUSCULAR | Status: AC
  Administered 2017-04-24 – 2017-04-25 (×2): 30 mg via INTRAVENOUS
  Filled 2017-04-24 (×2): qty 1

## 2017-04-24 MED ORDER — MIDAZOLAM HCL 2 MG/2ML IJ SOLN
INTRAMUSCULAR | Status: DC | PRN
  Administered 2017-04-24: 2 mg via INTRAVENOUS

## 2017-04-24 MED ORDER — FENTANYL CITRATE (PF) 100 MCG/2ML IJ SOLN
INTRAMUSCULAR | Status: DC | PRN
  Administered 2017-04-24: 50 ug via INTRAVENOUS
  Administered 2017-04-24: 100 ug via INTRAVENOUS
  Administered 2017-04-24 (×2): 50 ug via INTRAVENOUS

## 2017-04-24 MED ORDER — BUPIVACAINE LIPOSOME 1.3 % IJ SUSP
INTRAMUSCULAR | Status: DC | PRN
  Administered 2017-04-24: 20 mL

## 2017-04-24 MED ORDER — HYDROMORPHONE HCL 1 MG/ML IJ SOLN
0.2500 mg | INTRAMUSCULAR | Status: DC | PRN
  Administered 2017-04-24: 0.5 mg via INTRAVENOUS
  Filled 2017-04-24: qty 1

## 2017-04-24 MED ORDER — HYDROMORPHONE HCL 1 MG/ML IJ SOLN
INTRAMUSCULAR | Status: AC
  Filled 2017-04-24: qty 1

## 2017-04-24 MED ORDER — ONDANSETRON HCL 4 MG PO TABS: 8.0000 mg | ORAL_TABLET | Freq: Four times a day (QID) | ORAL | Status: DC | PRN

## 2017-04-24 MED ORDER — CEFAZOLIN SODIUM-DEXTROSE 2-4 GM/100ML-% IV SOLN
2.0000 g | INTRAVENOUS | Status: AC
  Administered 2017-04-24: 2 g via INTRAVENOUS

## 2017-04-24 MED ORDER — SODIUM CHLORIDE 0.9 % IV SOLN
8.0000 mg | Freq: Four times a day (QID) | INTRAVENOUS | Status: DC | PRN
  Filled 2017-04-24: qty 4

## 2017-04-24 SURGICAL SUPPLY — 52 items
APPLIER CLIP 13 LRG OPEN (CLIP)
BAG HAMPER (MISCELLANEOUS) ×4 IMPLANT
CELLS DAT CNTRL 66122 CELL SVR (MISCELLANEOUS) IMPLANT
CLIP APPLIE 13 LRG OPEN (CLIP) IMPLANT
CLOSURE WOUND 1/4 X3 (GAUZE/BANDAGES/DRESSINGS) ×1
CLOTH BEACON ORANGE TIMEOUT ST (SAFETY) ×4 IMPLANT
COVER LIGHT HANDLE STERIS (MISCELLANEOUS) ×8 IMPLANT
DERMABOND ADVANCED (GAUZE/BANDAGES/DRESSINGS) ×2
DERMABOND ADVANCED .7 DNX12 (GAUZE/BANDAGES/DRESSINGS) ×2 IMPLANT
DRAPE WARM FLUID 44X44 (DRAPE) ×4 IMPLANT
DRSG OPSITE POSTOP 4X10 (GAUZE/BANDAGES/DRESSINGS) ×4 IMPLANT
DRSG OPSITE POSTOP 4X8 (GAUZE/BANDAGES/DRESSINGS) ×4 IMPLANT
ELECT NEEDLE TIP 2.8 STRL (NEEDLE) ×4 IMPLANT
ELECT REM PT RETURN 9FT ADLT (ELECTROSURGICAL) ×4
ELECTRODE REM PT RTRN 9FT ADLT (ELECTROSURGICAL) ×2 IMPLANT
FORMALIN 10 PREFIL 480ML (MISCELLANEOUS) ×4 IMPLANT
GLOVE BIOGEL PI IND STRL 6.5 (GLOVE) ×4 IMPLANT
GLOVE BIOGEL PI IND STRL 7.0 (GLOVE) ×2 IMPLANT
GLOVE BIOGEL PI IND STRL 8 (GLOVE) ×2 IMPLANT
GLOVE BIOGEL PI INDICATOR 6.5 (GLOVE) ×4
GLOVE BIOGEL PI INDICATOR 7.0 (GLOVE) ×2
GLOVE BIOGEL PI INDICATOR 8 (GLOVE) ×2
GLOVE ECLIPSE 8.0 STRL XLNG CF (GLOVE) ×4 IMPLANT
GOWN SRG XL XLNG 56XLVL 4 (GOWN DISPOSABLE) ×2 IMPLANT
GOWN STRL NON-REIN XL XLG LVL4 (GOWN DISPOSABLE) ×2
GOWN STRL REUS W/TWL LRG LVL3 (GOWN DISPOSABLE) ×8 IMPLANT
GOWN STRL REUS W/TWL XL LVL3 (GOWN DISPOSABLE) ×4 IMPLANT
INST SET MAJOR GENERAL (KITS) ×4 IMPLANT
KIT ROOM TURNOVER APOR (KITS) ×4 IMPLANT
MANIFOLD NEPTUNE II (INSTRUMENTS) ×4 IMPLANT
NS IRRIG 1000ML POUR BTL (IV SOLUTION) ×8 IMPLANT
PACK ABDOMINAL MAJOR (CUSTOM PROCEDURE TRAY) ×4 IMPLANT
PAD ARMBOARD 7.5X6 YLW CONV (MISCELLANEOUS) ×4 IMPLANT
RETRACTOR WND ALEXIS 25 LRG (MISCELLANEOUS) IMPLANT
RTRCTR WOUND ALEXIS 18CM MED (MISCELLANEOUS)
RTRCTR WOUND ALEXIS 25CM LRG (MISCELLANEOUS)
SEPRAFILM MEMBRANE 5X6 (MISCELLANEOUS) ×4 IMPLANT
SET BASIN LINEN APH (SET/KITS/TRAYS/PACK) ×4 IMPLANT
SPONGE GAUZE 2X2 8PLY STER LF (GAUZE/BANDAGES/DRESSINGS) ×1
SPONGE GAUZE 2X2 8PLY STRL LF (GAUZE/BANDAGES/DRESSINGS) ×3 IMPLANT
SPONGE LAP 18X18 X RAY DECT (DISPOSABLE) ×4 IMPLANT
STAPLER VISISTAT 35W (STAPLE) ×4 IMPLANT
STRIP CLOSURE SKIN 1/4X3 (GAUZE/BANDAGES/DRESSINGS) ×3 IMPLANT
SUT CHROMIC 0 CT 1 (SUTURE) IMPLANT
SUT MON AB 0 CT1 (SUTURE) ×12 IMPLANT
SUT MON AB 3-0 SH 27 (SUTURE) ×8 IMPLANT
SUT PLAIN 2 0 XLH (SUTURE) ×4 IMPLANT
SUT VIC AB 0 CTX 36 (SUTURE) ×2
SUT VIC AB 0 CTX36XBRD ANTBCTR (SUTURE) ×2 IMPLANT
SUT VICRYL 3 0 (SUTURE) IMPLANT
TOWEL BLUE STERILE X RAY DET (MISCELLANEOUS) ×4 IMPLANT
TRAY FOLEY CATH SILVER 16FR (SET/KITS/TRAYS/PACK) ×4 IMPLANT

## 2017-04-24 NOTE — H&P (Signed)
[] Hide copied text  [] Hover for details   Preoperative History and Physical  Jacqueline Underwood is a 26 y.o. [phone] with Patient's last menstrual period was 03/15/2017. admitted for a abdominal myomectomy of a 11 cm myoma.  Recently she has become increasingly symptomatic consistent with degeneratyion She also wants her nexplanon removed from the right arm  Pt understands inherent risk of hysterectomy when a myomectomy is done  PMH:        Past Medical History:  Diagnosis Date  . BV (bacterial vaginosis)   . Pregnant   . Uterine fibroid in pregnancy   . UTI (urinary tract infection)   . Yeast infection     PSH:          Past Surgical History:  Procedure Laterality Date  . NO PAST SURGERIES      POb/GynH:              OB History    Gravida Para Term Preterm AB Living   3 2 1 1 1 3    SAB TAB Ectopic Multiple Live Births   1     1 3       SH:   Social History   Tobacco Use  . Smoking status: Never Smoker  . Smokeless tobacco: Never Used  Substance Use Topics  . Alcohol use: No  . Drug use: No    FH:         Family History  Problem Relation Age of Onset  . Hypertension Father   . Diabetes Maternal Grandmother      Allergies:       Allergies  Allergen Reactions  . Peanuts [Peanut Oil] Itching    Itching in throat.    Medications:       Current Outpatient Medications:  .  etonogestrel (NEXPLANON) 68 MG IMPL implant, 1 each by Subdermal route once., Disp: , Rfl:   Review of Systems:   Review of Systems  Constitutional: Negative for fever, chills, weight loss, malaise/fatigue and diaphoresis.  HENT: Negative for hearing loss, ear pain, nosebleeds, congestion, sore throat, neck pain, tinnitus and ear discharge.   Eyes: Negative for blurred vision, double vision, photophobia, pain, discharge and redness.  Respiratory: Negative for cough, hemoptysis, sputum production, shortness of breath, wheezing and stridor.    Cardiovascular: Negative for chest pain, palpitations, orthopnea, claudication, leg swelling and PND.  Gastrointestinal: Positive for abdominal pain. Negative for heartburn, nausea, vomiting, diarrhea, constipation, blood in stool and melena.  Genitourinary: Negative for dysuria, urgency, frequency, hematuria and flank pain.  Musculoskeletal: Negative for myalgias, back pain, joint pain and falls.  Skin: Negative for itching and rash.  Neurological: Negative for dizziness, tingling, tremors, sensory change, speech change, focal weakness, seizures, loss of consciousness, weakness and headaches.  Endo/Heme/Allergies: Negative for environmental allergies and polydipsia. Does not bruise/bleed easily.  Psychiatric/Behavioral: Negative for depression, suicidal ideas, hallucinations, memory loss and substance abuse. The patient is not nervous/anxious and does not have insomnia.      PHYSICAL EXAM:  Blood pressure 102/62, pulse 81, height 5' (1.524 m), weight 215 lb (97.5 kg), last menstrual period 03/15/2017.    Vitals reviewed. Constitutional: She is oriented to person, place, and time. She appears well-developed and well-nourished.  HENT:  Head: Normocephalic and atraumatic.  Right Ear: External ear normal.  Left Ear: External ear normal.  Nose: Nose normal.  Mouth/Throat: Oropharynx is clear and moist.  Eyes: Conjunctivae and EOM are normal. Pupils are equal, round, and reactive to  light. Right eye exhibits no discharge. Left eye exhibits no discharge. No scleral icterus.  Neck: Normal range of motion. Neck supple. No tracheal deviation present. No thyromegaly present.  Cardiovascular: Normal rate, regular rhythm, normal heart sounds and intact distal pulses.  Exam reveals no gallop and no friction rub.   No murmur heard. Respiratory: Effort normal and breath sounds normal. No respiratory distress. She has no wheezes. She has no rales. She exhibits no tenderness.  GI: Soft. Bowel  sounds are normal. She exhibits no distension and no mass. There is tenderness. There is no rebound and no guarding.  Genitourinary:       Vulva is normal without lesions Vagina is pink moist without discharge Cervix normal in appearance and pap is normal Uterus is tender enlarged due to myoma Adnexa is negative with normal sized ovaries by sonogram  Musculoskeletal: Normal range of motion. She exhibits no edema and no tenderness.  Neurological: She is alert and oriented to person, place, and time. She has normal reflexes. She displays normal reflexes. No cranial nerve deficit. She exhibits normal muscle tone. Coordination normal.  Skin: Skin is warm and dry. No rash noted. No erythema. No pallor.  Psychiatric: She has a normal mood and affect. Her behavior is normal. Judgment and thought content normal.    Labs:       Results for orders placed or performed in visit on 03/25/17 (from the past 336 hour(s))  Cytology - PAP   Collection Time: 03/25/17 12:00 AM  Result Value Ref Range   Adequacy      Satisfactory for evaluation  endocervical/transformation zone component ABSENT.   Diagnosis      NEGATIVE FOR INTRAEPITHELIAL LESIONS OR MALIGNANCY.   Chlamydia Negative    Neisseria gonorrhea Negative    Material Submitted CervicoVaginal Pap [ThinPrep Imaged]    CYTOLOGY - PAP PAP RESULT   HIV antibody   Collection Time: 03/25/17 11:08 AM  Result Value Ref Range   HIV Screen 4th Generation wRfx Non Reactive Non Reactive  RPR   Collection Time: 03/25/17 11:08 AM  Result Value Ref Range   RPR Ser Ql Non Reactive Non Reactive   Results for orders placed or performed during the hospital encounter of 04/24/17 (from the past 336 hour(s))  Prepare RBC   Collection Time: 04/15/17 11:12 AM  Result Value Ref Range   Order Confirmation ORDER PROCESSED BY BLOOD BANK   Results for orders placed or performed during the hospital encounter of 04/15/17 (from the past 336  hour(s))  CBC   Collection Time: 04/15/17 11:12 AM  Result Value Ref Range   WBC 7.1 4.0 - 10.5 K/uL   RBC 4.03 3.87 - 5.11 MIL/uL   Hemoglobin 12.6 12.0 - 15.0 g/dL   HCT 38.2 36.0 - 46.0 %   MCV 94.8 78.0 - 100.0 fL   MCH 31.3 26.0 - 34.0 pg   MCHC 33.0 30.0 - 36.0 g/dL   RDW 11.8 11.5 - 15.5 %   Platelets 236 150 - 400 K/uL  Comprehensive metabolic panel   Collection Time: 04/15/17 11:12 AM  Result Value Ref Range   Sodium 135 135 - 145 mmol/L   Potassium 3.4 (L) 3.5 - 5.1 mmol/L   Chloride 103 101 - 111 mmol/L   CO2 26 22 - 32 mmol/L   Glucose, Bld 92 65 - 99 mg/dL   BUN 12 6 - 20 mg/dL   Creatinine, Ser 0.69 0.44 - 1.00 mg/dL   Calcium 8.9 8.9 -  10.3 mg/dL   Total Protein 7.6 6.5 - 8.1 g/dL   Albumin 3.9 3.5 - 5.0 g/dL   AST 18 15 - 41 U/L   ALT 17 14 - 54 U/L   Alkaline Phosphatase 47 38 - 126 U/L   Total Bilirubin 0.5 0.3 - 1.2 mg/dL   GFR calc non Af Amer >60 >60 mL/min   GFR calc Af Amer >60 >60 mL/min   Anion gap 6 5 - 15  hCG, quantitative, pregnancy   Collection Time: 04/15/17 11:12 AM  Result Value Ref Range   hCG, Beta Chain, Quant, S <1 <5 mIU/mL  Rapid HIV screen (HIV 1/2 Ab+Ag)   Collection Time: 04/15/17 11:12 AM  Result Value Ref Range   HIV-1 P24 Antigen - HIV24 NON REACTIVE NON REACTIVE   HIV 1/2 Antibodies NON REACTIVE NON REACTIVE   Interpretation (HIV Ag Ab)      A non reactive test result means that HIV 1 or HIV 2 antibodies and HIV 1 p24 antigen were not detected in the specimen.  Urinalysis, Routine w reflex microscopic   Collection Time: 04/15/17 11:12 AM  Result Value Ref Range   Color, Urine YELLOW YELLOW   APPearance CLEAR CLEAR   Specific Gravity, Urine 1.025 1.005 - 1.030   pH 6.0 5.0 - 8.0   Glucose, UA NEGATIVE NEGATIVE mg/dL   Hgb urine dipstick NEGATIVE NEGATIVE   Bilirubin Urine NEGATIVE NEGATIVE   Ketones, ur NEGATIVE NEGATIVE mg/dL   Protein, ur NEGATIVE NEGATIVE mg/dL   Nitrite NEGATIVE NEGATIVE   Leukocytes, UA  NEGATIVE NEGATIVE  Type and screen   Collection Time: 04/15/17 11:12 AM  Result Value Ref Range   ABO/RH(D) O POS    Antibody Screen NEG    Sample Expiration 04/29/2017    Extend sample reason NO TRANSFUSIONS OR PREGNANCY IN THE PAST 3 MONTHS    Unit Number [id number]    Blood Component Type RED CELLS,LR    Unit division 00    Status of Unit ALLOCATED    Transfusion Status OK TO TRANSFUSE    Crossmatch Result Compatible    Unit Number [id number]    Blood Component Type RED CELLS,LR    Unit division 00    Status of Unit ALLOCATED    Transfusion Status OK TO TRANSFUSE    Crossmatch Result Compatible   BPAM RBC   Collection Time: 04/15/17 11:12 AM  Result Value Ref Range   Blood Product Unit Number [id number]    Unit Type and Rh 5100    Blood Product Expiration Date 732202542706    Blood Product Unit Number [id number]    Unit Type and Rh 5100    Blood Product Expiration Date 607371062694      EKG:    Orders placed or performed during the hospital encounter of 11/16/11  . ED EKG  . ED EKG  . EKG 12-Lead  . EKG 12-Lead  . EKG    Imaging Studies: CLINICAL DATA:  Left lower abdominal pain for 1 month.  EXAM: CT ABDOMEN AND PELVIS WITH CONTRAST  TECHNIQUE: Multidetector CT imaging of the abdomen and pelvis was performed using the standard protocol following bolus administration of intravenous contrast.  CONTRAST:  178mL ISOVUE-300 IOPAMIDOL (ISOVUE-300) INJECTION 61%  COMPARISON:  Ob ultrasound exam 06/05/2014.  FINDINGS: Lower chest: 3 mm left lower lobe pulmonary nodule (image 16 series 4).  Hepatobiliary: No focal abnormality within the liver parenchyma. Gallbladder decompressed. No intrahepatic or extrahepatic biliary dilation.  Pancreas: No focal mass  lesion. No dilatation of the main duct. No intraparenchymal cyst. No peripancreatic edema.  Spleen: No splenomegaly. No focal mass lesion.  Adrenals/Urinary Tract: No adrenal  nodule or mass. Kidneys unremarkable. No evidence for hydroureter. The urinary bladder appears normal for the degree of distention.  Stomach/Bowel: Tiny hiatal hernia. Stomach otherwise unremarkable. No small bowel wall thickening. No small bowel dilatation. The terminal ileum is normal. The appendix is normal. No gross colonic mass. No colonic wall thickening. No substantial diverticular change.  Vascular/Lymphatic: No abdominal aortic aneurysm. No abdominal aortic atherosclerotic calcification. There is no gastrohepatic or hepatoduodenal ligament lymphadenopathy. No intraperitoneal or retroperitoneal lymphadenopathy. No pelvic sidewall lymphadenopathy.  Reproductive: Uterus measures 18.7 x 10.1 x 11.4 cm. A large posterior fundal fibroid measures 10.3 x 8.6 x 10.8 cm which compares to ultrasound measurements obtained previously of 6.5 x 9.3 x 6.6 cm. There is no adnexal mass.  Other: No intraperitoneal free fluid.  Musculoskeletal: Bone windows reveal no worrisome lytic or sclerotic osseous lesions.  IMPRESSION: 1. No acute findings in the abdomen or pelvis. 2. Large posterior fundal fibroid measures up to 10.8 cm and results inuterine enlargement with top of fibroid extending up just cranial to the umbilicus. 3. 3 mm left lower lobe pulmonary nodule. No follow-up needed if patient is low-risk. Non-contrast chest CT can be considered in 12 months if patient is high-risk. This recommendation follows the consensus statement: Guidelines for Management of Incidental Pulmonary Nodules Detected on CT Images: From the Fleischner Society 2017; Radiology 2017; 284:228-243.    Assessment: 11 cm uterine myoma causing increasing pain, probable degenerating myoma     Patient Active Problem List   Diagnosis Date Noted  . Dysmenorrhea 03/25/2017  . Menometrorrhagia 03/25/2017  . Moody 03/25/2017  . Nexplanon in place 03/25/2017  . Enlarged uterus 03/25/2017  . Encounter  for gynecological examination with Papanicolaou smear of cervix 01/25/2015  . Preterm delivery, delivered 01/04/2015  . H/O cesarean section 01/04/2015  . Fibroids 10/21/2012    Plan: Abdominal myomectomy removal of nexplanon 04/24/2017  Pt understands the risks of surgery including but not limited t  excessive bleeding requiring transfusion or reoperation, post-operative infection requiring prolonged hospitalization or re-hospitalization and antibiotic therapy, and damage to other organs including bladder, bowel, ureters and major vessels.  The patient also understands the alternative treatment options which were discussed in full.  All questions were answered.  Mertie Clause Junko [place]agan 04/24/2017 11:06 AM   Lynne Righi H 04/24/2017 11:04 AM

## 2017-04-24 NOTE — Anesthesia Procedure Notes (Signed)
Procedure Name: Intubation Date/Time: 04/24/2017 11:24 AM Performed by: Vista Deck, CRNA Pre-anesthesia Checklist: Patient identified, Patient being monitored, Timeout performed, Emergency Drugs available and Suction available Patient Re-evaluated:Patient Re-evaluated prior to induction Oxygen Delivery Method: Circle System Utilized Preoxygenation: Pre-oxygenation with 100% oxygen Induction Type: IV induction, Rapid sequence and Cricoid Pressure applied Laryngoscope Size: Mac and 3 Grade View: Grade II Tube type: Oral Tube size: 7.0 mm Number of attempts: 1 Airway Equipment and Method: stylet and Oral airway Placement Confirmation: ETT inserted through vocal cords under direct vision,  positive ETCO2 and breath sounds checked- equal and bilateral Secured at: 22 cm Tube secured with: Tape Dental Injury: Teeth and Oropharynx as per pre-operative assessment

## 2017-04-24 NOTE — Op Note (Signed)
Preoperative diagnosis:  1.  11 cm degenerating uterine leiomyoma                                          2.  Abdominal pain                                         3.  Desires removal of nexplanon                                         4.    Postoperative diagnosis:  Same as above + solitary myoma was 13 cm  Procedure:  Abdominal myomectomy + removal of Nexplanon, right arm  Surgeon:  Florian Buff  Assistant:    Anesthesia:  General endotracheal  Preoperative clinical summary:   Jacqueline N Parkeris a 21 [id number] with Patient's last menstrual period was 03/15/2017.admitted for a abdominal myomectomy of a 11 cm myoma.  Recently she has become increasingly symptomatic consistent with degeneratyion She also wants her nexplanon removed from the right arm  Pt understands inherent risk of hysterectomy when a myomectomy is done    Intraoperative findings: 13 cm fibroid, soft consistent with degeneration  Description of operation:  Patient was taken to the operating room and placed in the supine position where she underwent general endotracheal anesthesia.  She was then prepped and draped in the usual sterile fashion and a Foley catheter was placed for continuous bladder drainage.  A Pfannenstiel skin incision was made and carried down sharply to the rectus fascia which was scored in the midline and extended laterally.  The fascia was taken off the muscles superiorly and inferiorly without difficulty.  The muscles were divided. The peritoneal cavity was entered and the uterus was delivered through the incision. The bovie was used and an incision was made on the anteriror surface of the uterus.  Blunt dissection was then used and the myoma was "shelled" out of the uterus without difficulty and little blood loss.  It was soft consistent with degeneration.  There were no other leiomyomata noted.  The endometrium was entered in the removing of the fibroid, it actually was distorting the  endometrium.  The uterine defect was then closed in multiple layers 5 in all for restoration of anatomy and hemostasis.  I observed the uterus for some time and was comfortable with the hemostasis achieved.   The pelvis was irrigated vigorously. The uterus was then replaced into the abdomen and again was found to be hemostatic. The muscles and peritoneum were reapproximated loosely.  The fascia was closed with 0 Vicryl running.  The subcutaneous tissue was reapproximated using 2-0 plain gut.  The skin was closed using 3-0 Vicryl on a Keith needle in a subcuticular fashion.  Dermabond was then applied for additional wound integrity and to serve as a postoperative bacterial barrier.  The patient was awakened from anesthesia taken to the recovery room in good stable condition. All sponge instrument and needle counts were correct x 3.  The patient received Ancef and Toradol prophylactically preoperatively.  Estimated blood loss for the procedure was 500  Cc  The upper right are was then prepped and a stab incision made at the  end of the nexplanon.  Hemostat was used and the nexplanon was removed without difficulty.  Steri strips placed and bandaged.  Florian Buff 04/24/2017 12:52 PM

## 2017-04-24 NOTE — Transfer of Care (Signed)
Immediate Anesthesia Transfer of Care Note  Patient: Jacqueline Underwood  Procedure(s) Performed: ABDOMINAL MYOMECTOMY OF UTERUS (N/A Abdomen) REMOVAL OF NEXPLANON RIGHT ARM (Right Arm Upper)  Patient Location: PACU  Anesthesia Type:General  Level of Consciousness: awake and patient cooperative  Airway & Oxygen Therapy: Patient Spontanous Breathing  Post-op Assessment: Report given to RN and Post -op Vital signs reviewed and stable  Post vital signs: Reviewed and stable  Last Vitals:  Vitals:   04/24/17 0846 04/24/17 0920  BP: 122/61 (!) 99/58  Pulse: 70   Resp:  (!) 22  Temp: 36.7 C   SpO2: 98% 97%    Last Pain:  Vitals:   04/24/17 0846  TempSrc: Oral         Complications: No apparent anesthesia complications

## 2017-04-24 NOTE — Anesthesia Postprocedure Evaluation (Signed)
Anesthesia Post Note  Patient: Facilities manager  Procedure(s) Performed: ABDOMINAL MYOMECTOMY OF UTERUS (N/A Abdomen) REMOVAL OF NEXPLANON RIGHT ARM (Right Arm Upper)  Patient location during evaluation: PACU Anesthesia Type: General Level of consciousness: awake and alert Pain management: satisfactory to patient Vital Signs Assessment: post-procedure vital signs reviewed and stable Respiratory status: spontaneous breathing Cardiovascular status: stable Postop Assessment: no apparent nausea or vomiting Anesthetic complications: no     Last Vitals:  Vitals:   04/24/17 1330 04/24/17 1345  BP: 103/69 (!) 97/58  Pulse: 60 61  Resp: 20 14  Temp:    SpO2: 99% 94%    Last Pain:  Vitals:   04/24/17 1345  TempSrc:   PainSc: Asleep                 Vraj Denardo

## 2017-04-24 NOTE — Anesthesia Preprocedure Evaluation (Signed)
Anesthesia Evaluation  Patient identified by MRN, date of birth, ID band Patient awake    Reviewed: Allergy & Precautions, NPO status , Patient's Chart, lab work & pertinent test results  Airway Mallampati: I  TM Distance: >3 FB Neck ROM: Full    Dental no notable dental hx. (+) Teeth Intact   Pulmonary neg pulmonary ROS,    Pulmonary exam normal breath sounds clear to auscultation       Cardiovascular negative cardio ROS Normal cardiovascular exam Rhythm:Regular Rate:Normal     Neuro/Psych negative neurological ROS  negative psych ROS   GI/Hepatic negative GI ROS, Neg liver ROS,   Endo/Other  Morbid obesity  Renal/GU negative Renal ROS  negative genitourinary   Musculoskeletal negative musculoskeletal ROS (+)   Abdominal (+) + obese,   Peds  Hematology  (+) anemia ,   Anesthesia Other Findings   Reproductive/Obstetrics                             Anesthesia Physical Anesthesia Plan  ASA: III  Anesthesia Plan: General   Post-op Pain Management:    Induction: Intravenous, Rapid sequence and Cricoid pressure planned  PONV Risk Score and Plan:   Airway Management Planned: Oral ETT  Additional Equipment:   Intra-op Plan:   Post-operative Plan: Extubation in OR  Informed Consent: I have reviewed the patients History and Physical, chart, labs and discussed the procedure including the risks, benefits and alternatives for the proposed anesthesia with the patient or authorized representative who has indicated his/her understanding and acceptance.     Plan Discussed with:   Anesthesia Plan Comments:         Anesthesia Quick Evaluation

## 2017-04-25 ENCOUNTER — Encounter (HOSPITAL_COMMUNITY): Payer: Self-pay | Admitting: Obstetrics & Gynecology

## 2017-04-25 LAB — BASIC METABOLIC PANEL
Anion gap: 6 (ref 5–15)
BUN: 10 mg/dL (ref 6–20)
CALCIUM: 8.5 mg/dL — AB (ref 8.9–10.3)
CHLORIDE: 104 mmol/L (ref 101–111)
CO2: 27 mmol/L (ref 22–32)
Creatinine, Ser: 0.83 mg/dL (ref 0.44–1.00)
GFR calc Af Amer: >60 mL/min (ref >60)
GFR calc non Af Amer: >60 mL/min (ref >60)
GLUCOSE: 96 mg/dL (ref 65–99)
POTASSIUM: 4.4 mmol/L (ref 3.5–5.1)
Sodium: 137 mmol/L (ref 135–145)

## 2017-04-25 LAB — CBC
HEMATOCRIT: 32.6 % — AB (ref 36.0–46.0)
Hemoglobin: 10.7 g/dL — ABNORMAL LOW (ref 12.0–15.0)
MCH: 31.6 pg (ref 26.0–34.0)
MCHC: 32.8 g/dL (ref 30.0–36.0)
MCV: 96.2 fL (ref 78.0–100.0)
PLATELETS: 243 10*3/uL (ref 150–400)
RBC: 3.39 MIL/uL — ABNORMAL LOW (ref 3.87–5.11)
RDW: 12.1 % (ref 11.5–15.5)
WBC: 10.1 10*3/uL (ref 4.0–10.5)

## 2017-04-25 MED ORDER — PROMETHAZINE HCL 25 MG PO TABS: 25.0000 mg | ORAL_TABLET | Freq: Four times a day (QID) | ORAL | 1 refills | Status: DC | PRN

## 2017-04-25 MED ORDER — OXYCODONE-ACETAMINOPHEN 7.5-325 MG PO TABS: 1.0000 | ORAL_TABLET | ORAL | 0 refills | Status: DC | PRN

## 2017-04-25 MED ORDER — OXYCODONE-ACETAMINOPHEN 7.5-325 MG PO TABS: 1.0000 | ORAL_TABLET | ORAL | Status: DC | PRN

## 2017-04-25 MED ORDER — KETOROLAC TROMETHAMINE 10 MG PO TABS: 10.0000 mg | ORAL_TABLET | Freq: Three times a day (TID) | ORAL | 0 refills | Status: DC | PRN

## 2017-04-25 NOTE — Discharge Summary (Signed)
Physician Discharge Summary  Patient ID: Jacqueline Underwood MRN: [id number] DOB/AGE: 1990/07/19 26 y.o.  Admit date: 04/24/2017 Discharge date: 04/25/2017  Admission Diagnoses: Degenerating large uterine myoma  Discharge Diagnoses:  Active Problems:   S/P myomectomy   Discharged Condition: good  Hospital Course: unremarkable post operative course  Consults: None  Significant Diagnostic Studies: labs:   Treatments: surgery: abdominal myomectomy  Discharge Exam: Blood pressure (!) 90/51, pulse 71, temperature 97.9 F (36.6 C), temperature source Oral, resp. rate 18, height 5' (1.524 m), weight 216 lb (98 kg), SpO2 98 %. General appearance: alert, cooperative and no distress GI: soft, non-tender; bowel sounds normal; no masses,  no organomegaly Incision/Wound:clean dry intact  Disposition: 01-Home or Self Care  Discharge Instructions    Call MD for:  persistant nausea and vomiting   Complete by:  As directed    Call MD for:  severe uncontrolled pain   Complete by:  As directed    Call MD for:  temperature >100.4   Complete by:  As directed    Diet - low sodium heart healthy   Complete by:  As directed    Driving Restrictions   Complete by:  As directed    No driving for 1 week   Increase activity slowly   Complete by:  As directed    Leave dressing on - Keep it clean, dry, and intact until clinic visit   Complete by:  As directed    Lifting restrictions   Complete by:  As directed    Do not lift more than 10 pounds   Sexual Activity Restrictions   Complete by:  As directed    No sex for 6 weeks     Allergies as of 04/25/2017      Reactions   Peanuts [peanut Oil] Itching   Itching in throat.      Medication List    STOP taking these medications   ibuprofen 200 MG tablet Commonly known as:  ADVIL,MOTRIN   naproxen sodium 220 MG tablet Commonly known as:  ALEVE   NEXPLANON 68 MG Impl implant Generic drug:  etonogestrel     TAKE these medications    ADULT GUMMY PO Take 2 each daily by mouth.   ketorolac 10 MG tablet Commonly known as:  TORADOL Take 1 tablet (10 mg total) by mouth every 8 (eight) hours as needed.   oxyCODONE-acetaminophen 7.5-325 MG tablet Commonly known as:  PERCOCET Take 1-2 tablets by mouth every 4 (four) hours as needed for moderate pain.   promethazine 25 MG tablet Commonly known as:  PHENERGAN Take 1 tablet (25 mg total) by mouth every 6 (six) hours as needed for nausea.      Follow-up Information    Florian Buff, MD Follow up.   Specialties:  Obstetrics and Gynecology, Radiology Why:  post op Contact information: [place] [address] [phone]           Signed: Florian Buff 04/25/2017, 2:53 PM

## 2017-04-25 NOTE — Anesthesia Postprocedure Evaluation (Signed)
Anesthesia Post Note  Patient: Facilities manager  Procedure(s) Performed: ABDOMINAL MYOMECTOMY OF UTERUS (N/A Abdomen) REMOVAL OF NEXPLANON RIGHT ARM (Right Arm Upper)  Patient location during evaluation: Nursing Unit Anesthesia Type: General Level of consciousness: awake and alert Pain management: pain level controlled Vital Signs Assessment: post-procedure vital signs reviewed and stable Respiratory status: spontaneous breathing, nonlabored ventilation and respiratory function stable Cardiovascular status: blood pressure returned to baseline Postop Assessment: no apparent nausea or vomiting Anesthetic complications: no     Last Vitals:  Vitals:   04/25/17 0400 04/25/17 0800  BP: (!) 92/53 (!) 90/51  Pulse: 95 71  Resp: 20 18  Temp: 36.8 C 36.6 C  SpO2: 98% 98%    Last Pain:  Vitals:   04/25/17 0926  TempSrc:   PainSc: 0-No pain                 Jacqueline Underwood J

## 2017-04-25 NOTE — Progress Notes (Signed)
Patient IV removed, tolerated well. Discharge instructions given at bedside.  

## 2017-04-25 NOTE — Discharge Instructions (Signed)
Myomectomy, Care After °Refer to this sheet in the next few weeks. These instructions provide you with information on caring for yourself after your procedure. Your health care provider may also give you more specific instructions. Your treatment has been planned according to current medical practices, but problems sometimes occur. Call your health care provider if you have any problems or questions after your procedure. °What can I expect after the procedure? °After your procedure, it is typical to have the following: °· Pain in your abdomen, especially at any incision sites. You will be given pain medicine to control the pain. °· Tiredness. This is a normal part of the recovery process. Your energy level will return to normal over the next several weeks. °· Constipation. °· Vaginal bleeding. This is normal and should stop after 1-2 weeks. ° °Follow these instructions at home: °· Only take over-the-counter or prescription medicines as directed by your health care provider. Avoid aspirin because it can cause bleeding. °· Do not douche, use tampons, or have sexual intercourse until given permission by your health care provider. °· Remove or change any bandages (dressings) as directed by your health care provider. °· Take showers instead of baths as directed by your health care provider. °· You will probably be able to go back to your normal routine after a few days. Do not do anything that requires extra effort until your health care provider says it is okay. Do not lift anything heavier than 15 pounds (6.8 kg) until your health care provider approves. °· Walk daily but take frequent rest breaks if you tire easily. °· Continue to practice deep breathing and coughing. If it hurts to cough, try holding a pillow against your belly as you cough. °· If you become constipated, you may: °? Use a mild laxative if your health care provider approves. °? Add more fruit and bran to your diet. °? Drink enough fluids to keep your  urine clear or pale yellow. °· Take your temperature twice a day and write it down. °· Do not drink alcohol. °· Do not drive until your health care provider approves. °· Have someone help you at home for 1 week or until you can do your own household activities. °· Follow up with your health care provider as directed. °Contact a health care provider if: °· You have a fever. °· You have increasing abdominal pain that is not relieved with medicine. °· You have nausea, vomiting, or diarrhea. °· You have pain when you urinate, or you have blood in your urine. °· You have a rash on your body. °· You have pain or redness where your IV access tube was inserted. °· You have redness, swelling, or any kind of drainage from an incision. °Get help right away if: °· You have weakness or lightheadedness. °· You have pain, swelling, or redness in your legs. °· You have chest pain. °· You faint. °· You have shortness of breath. °· You have heavy vaginal bleeding. °· Your incision is opening up. °This information is not intended to replace advice given to you by your health care provider. Make sure you discuss any questions you have with your health care provider. °Document Released: 10/04/2010 Document Revised: 10/20/2015 Document Reviewed: 12/24/2012 °Elsevier Interactive Patient Education © 2017 [employer] ° °

## 2017-04-25 NOTE — Addendum Note (Signed)
Addendum  created 04/25/17 1057 by Charmaine Downs, CRNA   Sign clinical note

## 2017-04-25 NOTE — Progress Notes (Signed)
At approximately 2330, This RN went to check on patient. Patient stated that she was having some pain across upper chest rated 9/10. Patient stated that pain started on left upper chest, then progressed and spread across to right upper chest. Patient c/o SOB, denied dizziness, denied radiation away from chest. Patient had to urinate, so assisted up to bathroom. EKG ordered stat per protocol. Dr. Glo Herring notified. Patient stated that chest pain decreased to 7-8/10 while up to bathroom. Patient assisted into bed. Dilaudid 1 mg given IV per PRN orders. GI Cocktail given PO x1 per MD order. Patient denies chest pain at this time. Patient encouraged to call if chest pain returns or with any needs. RT in room to complete EKG at this time. Will continue to monitor.

## 2017-04-29 DIAGNOSIS — Z029 Encounter for administrative examinations, unspecified: Secondary | ICD-10-CM

## 2017-04-30 LAB — TYPE AND SCREEN
ABO/RH(D): O POS
Antibody Screen: NEGATIVE
UNIT DIVISION: 0
Unit division: 0

## 2017-04-30 LAB — BPAM RBC
Blood Product Expiration Date: 201812202359
Blood Product Expiration Date: 201812202359
UNIT TYPE AND RH: 5100
UNIT TYPE AND RH: 5100

## 2017-05-02 ENCOUNTER — Ambulatory Visit (INDEPENDENT_AMBULATORY_CARE_PROVIDER_SITE_OTHER): Payer: BLUE CROSS/BLUE SHIELD | Admitting: Obstetrics & Gynecology

## 2017-05-02 ENCOUNTER — Encounter: Payer: Self-pay | Admitting: Obstetrics & Gynecology

## 2017-05-02 VITALS — BP 104/70 | HR 96 | Wt 214.0 lb

## 2017-05-02 DIAGNOSIS — Z9889 Other specified postprocedural states: Secondary | ICD-10-CM

## 2017-05-14 ENCOUNTER — Encounter: Payer: Self-pay | Admitting: Obstetrics & Gynecology

## 2017-05-14 ENCOUNTER — Ambulatory Visit (INDEPENDENT_AMBULATORY_CARE_PROVIDER_SITE_OTHER): Payer: BLUE CROSS/BLUE SHIELD | Admitting: Obstetrics & Gynecology

## 2017-05-14 ENCOUNTER — Other Ambulatory Visit: Payer: Self-pay

## 2017-05-14 VITALS — BP 108/72 | HR 84 | Ht 60.0 in | Wt 214.0 lb

## 2017-05-14 DIAGNOSIS — Z9889 Other specified postprocedural states: Secondary | ICD-10-CM

## 2017-05-14 MED ORDER — NORETHINDRONE ACET-ETHINYL EST 1.5-30 MG-MCG PO TABS: 1.0000 | ORAL_TABLET | Freq: Every day | ORAL | 11 refills | Status: DC

## 2017-05-14 NOTE — Progress Notes (Signed)
  HPI: Patient returns for routine postoperative follow-up having undergone abdominal myomectomy on 04/24/2017.  The patient's immediate postoperative recovery has been unremarkable. Since hospital discharge the patient reports no problems, doing great.   Current Outpatient Medications: ketorolac (TORADOL) 10 MG tablet, Take 1 tablet (10 mg total) by mouth every 8 (eight) hours as needed. (Patient not taking: Reported on 05/14/2017), Disp: 15 tablet, Rfl: 0 Multiple Vitamins-Minerals (ADULT GUMMY PO), Take 2 each daily by mouth., Disp: , Rfl:  oxyCODONE-acetaminophen (PERCOCET) 7.5-325 MG tablet, Take 1-2 tablets by mouth every 4 (four) hours as needed for moderate pain. (Patient not taking: Reported on 05/02/2017), Disp: 30 tablet, Rfl: 0 promethazine (PHENERGAN) 25 MG tablet, Take 1 tablet (25 mg total) by mouth every 6 (six) hours as needed for nausea. (Patient not taking: Reported on 05/02/2017), Disp: 30 tablet, Rfl: 1  No current facility-administered medications for this visit.     Blood pressure 108/72, pulse 84, height 5' (1.524 m), weight 214 lb (97.1 kg).  Physical Exam: Incision well healed Uterus   Diagnostic Tests: none  Pathology: benign  Impression: Normal post op course  Plan: Must have repeat section   Follow up: 1  years  Florian Buff, MD

## 2017-06-09 NOTE — Progress Notes (Signed)
  HPI: Patient returns for routine postoperative follow-up having undergone abdominal myomectomy on 04/24/2017.  The patient's immediate postoperative recovery has been unremarkable. Since hospital discharge the patient reports no problems, no bowel bladder problems.   Current Outpatient Medications: ketorolac (TORADOL) 10 MG tablet, Take 1 tablet (10 mg total) by mouth every 8 (eight) hours as needed. (Patient not taking: Reported on 05/14/2017), Disp: 15 tablet, Rfl: 0 Multiple Vitamins-Minerals (ADULT GUMMY PO), Take 2 each daily by mouth., Disp: , Rfl:  Norethindrone Acetate-Ethinyl Estradiol (LOESTRIN 1.5/30, 21,) 1.5-30 MG-MCG tablet, Take 1 tablet by mouth daily., Disp: 1 Package, Rfl: 11 oxyCODONE-acetaminophen (PERCOCET) 7.5-325 MG tablet, Take 1-2 tablets by mouth every 4 (four) hours as needed for moderate pain. (Patient not taking: Reported on 05/02/2017), Disp: 30 tablet, Rfl: 0 promethazine (PHENERGAN) 25 MG tablet, Take 1 tablet (25 mg total) by mouth every 6 (six) hours as needed for nausea. (Patient not taking: Reported on 05/02/2017), Disp: 30 tablet, Rfl: 1  No current facility-administered medications for this visit.     Blood pressure 104/70, pulse 96, weight 214 lb (97.1 kg).  Physical Exam: Incision clean dry intact Abdominal exam is normal  Diagnostic Tests:   Pathology: benign  Impression: S/p abdominal myomectomy, normal post operative course  Plan:   Follow up: Return in about 2 weeks (around 05/16/2017) for [place], with Dr Elonda Husky.   Florian Buff, MD

## 2017-06-20 ENCOUNTER — Encounter: Payer: Self-pay | Admitting: Adult Health

## 2017-06-20 ENCOUNTER — Ambulatory Visit (INDEPENDENT_AMBULATORY_CARE_PROVIDER_SITE_OTHER): Payer: BLUE CROSS/BLUE SHIELD | Admitting: Adult Health

## 2017-06-20 VITALS — BP 118/70 | HR 75 | Ht 62.0 in | Wt 218.0 lb

## 2017-06-20 DIAGNOSIS — Z3009 Encounter for other general counseling and advice on contraception: Secondary | ICD-10-CM | POA: Diagnosis not present

## 2017-06-20 DIAGNOSIS — Z30017 Encounter for initial prescription of implantable subdermal contraceptive: Secondary | ICD-10-CM

## 2017-06-20 NOTE — Patient Instructions (Signed)
No sex Get [facility] 2/6 nexplanon 2/7

## 2017-06-20 NOTE — Progress Notes (Signed)
Subjective:     Patient ID: Jacqueline Underwood, female   DOB: 1990-11-23, 27 y.o.   MRN: [id number]  HPI Jacqueline Underwood is a 27 year old black female in to discuss birth control, has been on OCs but forgets to take and wants nexplanon again.   Review of Systems Patient denies any headaches, hearing loss, fatigue, blurred vision, shortness of breath, chest pain, abdominal pain, problems with bowel movements, urination, or intercourse. No joint pain or mood swings. Reviewed past medical,surgical, social and family history. Reviewed medications and allergies.     Objective:   Physical Exam BP 118/70 (BP Location: Right Arm, Patient Position: Sitting, Cuff Size: Large)   Pulse 75   Ht 5\' 2"  (1.575 m)   Wt 218 lb (98.9 kg)   LMP 06/01/2017 (Exact Date)   BMI 39.87 kg/m   Talk only:she forgets the pill wants nexplanon, has had in past.  Last sex 06/11/17, and has been off her OCs about a week now.    Assessment:     1. Encounter for general counseling and advice on contraceptive management       Plan:    No sex Check QHCG 2/6 pm  Return 2/7 at 4 pm for nexplanon insertion

## 2017-06-21 LAB — BETA HCG QUANT (REF LAB): hCG Quant: <1 m[IU]/mL

## 2017-06-26 ENCOUNTER — Telehealth: Payer: Self-pay | Admitting: *Deleted

## 2017-06-26 NOTE — Telephone Encounter (Signed)
Informed HCG was <1. Pt very excited. Informed patient she will still need to come on 2/6 for HCG draw before 2/7 appt for nexplanon insertion and no sex between now and then. Verbalized understanding.

## 2017-07-03 ENCOUNTER — Other Ambulatory Visit: Payer: BLUE CROSS/BLUE SHIELD

## 2017-07-03 DIAGNOSIS — Z3046 Encounter for surveillance of implantable subdermal contraceptive: Secondary | ICD-10-CM

## 2017-07-04 ENCOUNTER — Encounter: Payer: Self-pay | Admitting: Adult Health

## 2017-07-04 ENCOUNTER — Ambulatory Visit (INDEPENDENT_AMBULATORY_CARE_PROVIDER_SITE_OTHER): Payer: BLUE CROSS/BLUE SHIELD | Admitting: Adult Health

## 2017-07-04 VITALS — BP 98/60 | HR 76 | Ht 60.0 in | Wt 219.5 lb

## 2017-07-04 DIAGNOSIS — Z3046 Encounter for surveillance of implantable subdermal contraceptive: Secondary | ICD-10-CM | POA: Diagnosis not present

## 2017-07-04 DIAGNOSIS — Z3202 Encounter for pregnancy test, result negative: Secondary | ICD-10-CM

## 2017-07-04 DIAGNOSIS — Z30017 Encounter for initial prescription of implantable subdermal contraceptive: Secondary | ICD-10-CM

## 2017-07-04 LAB — POCT URINE PREGNANCY: PREG TEST UR: NEGATIVE

## 2017-07-04 LAB — BETA HCG QUANT (REF LAB)

## 2017-07-04 MED ORDER — ETONOGESTREL 68 MG ~~LOC~~ IMPL
68.0000 mg | DRUG_IMPLANT | Freq: Once | SUBCUTANEOUS | Status: AC
  Administered 2017-07-04: 68 mg via SUBCUTANEOUS

## 2017-07-04 NOTE — Patient Instructions (Addendum)
Use condoms x 2 weeks, keep clean and dry x 24 hours, no heavy lifting, keep steri strips on x 72 hours, Keep pressure dressing on x 24 hours. Follow up prn problems. Remove in 3 years

## 2017-07-04 NOTE — Progress Notes (Signed)
Subjective:     Patient ID: Jacqueline Underwood, female   DOB: [birth year], 27 y.o.   MRN: [id number]  HPI Jacqueline Underwood is a 27 year old black female in for nexplanon insertion, last sex 2 weeks ago, QHCG <1 yesterday.   Review of Systems For nexplanon insertion Reviewed past medical,surgical, social and family history. Reviewed medications and allergies.     Objective:   Physical Exam BP 98/60 (BP Location: Left Arm, Patient Position: Sitting, Cuff Size: Large)   Pulse 76   Ht 5' (1.524 m)   Wt 219 lb 8 oz (99.6 kg)   LMP 06/18/2017   BMI 42.87 kg/m UPT negative. Consent signed, time out called. Right arm cleansed with betadine, and injected with 1.5 cc 1% lidocaine and waited til numb. Nexplanon easily inserted and steri strips applied.Rod easily palpated by provider and pt. Pressure dressing applied.    Assessment:     Nexplanon insertion Lot # [id number]  Exp: 10/2019    Plan:     Use condoms x 2 weeks, keep clean and dry x 24 hours, no heavy lifting, keep steri strips on x 72 hours, Keep pressure dressing on x 24 hours. Follow up prn problems. Remove in 3 years

## 2017-07-04 NOTE — Addendum Note (Signed)
Addended by: Linton Rump on: 07/04/2017 05:02 PM   Modules accepted: Orders

## 2017-08-20 ENCOUNTER — Encounter (HOSPITAL_COMMUNITY): Payer: Self-pay | Admitting: Emergency Medicine

## 2017-08-20 ENCOUNTER — Emergency Department (HOSPITAL_COMMUNITY)
Admission: EM | Admit: 2017-08-20 | Discharge: 2017-08-20 | Disposition: A | Discharge status: Home / Self Care | Payer: BLUE CROSS/BLUE SHIELD | Attending: Emergency Medicine | Admitting: Emergency Medicine

## 2017-08-20 ENCOUNTER — Other Ambulatory Visit: Payer: Self-pay

## 2017-08-20 ENCOUNTER — Emergency Department (HOSPITAL_COMMUNITY): Payer: BLUE CROSS/BLUE SHIELD

## 2017-08-20 DIAGNOSIS — B349 Viral infection, unspecified: Secondary | ICD-10-CM | POA: Diagnosis not present

## 2017-08-20 DIAGNOSIS — R0989 Other specified symptoms and signs involving the circulatory and respiratory systems: Secondary | ICD-10-CM | POA: Diagnosis not present

## 2017-08-20 DIAGNOSIS — R52 Pain, unspecified: Secondary | ICD-10-CM | POA: Diagnosis not present

## 2017-08-20 DIAGNOSIS — M791 Myalgia, unspecified site: Secondary | ICD-10-CM | POA: Diagnosis not present

## 2017-08-20 LAB — URINALYSIS, ROUTINE W REFLEX MICROSCOPIC
BILIRUBIN URINE: NEGATIVE
Glucose, UA: NEGATIVE mg/dL
Hgb urine dipstick: NEGATIVE
KETONES UR: NEGATIVE mg/dL
Leukocytes, UA: NEGATIVE
Nitrite: NEGATIVE
PH: 5 (ref 5.0–8.0)
PROTEIN: NEGATIVE mg/dL
Specific Gravity, Urine: 1.025 (ref 1.005–1.030)

## 2017-08-20 LAB — POC URINE PREG, ED: Preg Test, Ur: NEGATIVE

## 2017-08-20 MED ORDER — HYDROCODONE-HOMATROPINE 5-1.5 MG/5ML PO SYRP: 5.0000 mL | ORAL_SOLUTION | Freq: Four times a day (QID) | ORAL | 0 refills | Status: DC | PRN

## 2017-08-20 MED ORDER — ONDANSETRON HCL 4 MG PO TABS: 4.0000 mg | ORAL_TABLET | Freq: Four times a day (QID) | ORAL | 0 refills | Status: DC

## 2017-08-20 MED ORDER — DEXAMETHASONE 4 MG PO TABS: 4.0000 mg | ORAL_TABLET | Freq: Two times a day (BID) | ORAL | 0 refills | Status: DC

## 2017-08-20 MED ORDER — ONDANSETRON HCL 4 MG PO TABS
4.0000 mg | ORAL_TABLET | Freq: Once | ORAL | Status: AC
  Administered 2017-08-20: 4 mg via ORAL
  Filled 2017-08-20: qty 1

## 2017-08-20 MED ORDER — ACETAMINOPHEN 500 MG PO TABS
1000.0000 mg | ORAL_TABLET | Freq: Once | ORAL | Status: AC
  Administered 2017-08-20: 1000 mg via ORAL
  Filled 2017-08-20: qty 2

## 2017-08-20 MED ORDER — LORATADINE-PSEUDOEPHEDRINE ER 5-120 MG PO TB12: 1.0000 | ORAL_TABLET | Freq: Two times a day (BID) | ORAL | 0 refills | Status: DC

## 2017-08-20 NOTE — Discharge Instructions (Addendum)
Your temperature is 100.4, your oxygen level is within normal limits at 97% on room air.  Your examination suggest a viral respiratory illness.  Please use Tylenol every 4 hours, or ibuprofen every 6 hours for fever and chills.  Please use Decadron 2 times daily with food.  May use Hycodan every 6 hours as needed for cough and congestion.  Use Claritin-D every 12 hours for nasal congestion and sinus pressure.  It is important that you increase of fluids.  It is also important that you and all members of the house wash hands frequently.  Please use your mask until symptoms have resolved.  Please see your primary physician or return to the emergency department if not improving.

## 2017-08-20 NOTE — ED Notes (Signed)
EDP at bedside updating patient. 

## 2017-08-20 NOTE — ED Provider Notes (Signed)
[facility] EMERGENCY DEPARTMENT Provider Note   CSN: [id number] Arrival date & time: 08/20/17  0815     History   Chief Complaint Chief Complaint  Patient presents with  . Generalized Body Aches    HPI Jacqueline Underwood is a 27 y.o. female.  The history is provided by the patient.  URI   This is a new problem. The current episode started 6 to 12 hours ago. The problem has been gradually worsening. The maximum temperature recorded prior to her arrival was 100 to 100.9 F. Associated symptoms include diarrhea, vomiting, congestion, headaches, sinus pain, sneezing, cough and wheezing. Pertinent negatives include no chest pain, no abdominal pain, no dysuria and no neck pain. She has tried nothing for the symptoms.    Past Medical History:  Diagnosis Date  . BV (bacterial vaginosis)   . Menorrhagia   . Pregnant   . Uterine fibroid in pregnancy   . Uterine myoma   . UTI (urinary tract infection)   . Yeast infection     Patient Active Problem List   Diagnosis Date Noted  . S/P myomectomy 04/24/2017  . Dysmenorrhea 03/25/2017  . Menometrorrhagia 03/25/2017  . [place] 03/25/2017  . Nexplanon in place 03/25/2017  . Enlarged uterus 03/25/2017  . Encounter for gynecological examination with Papanicolaou smear of cervix 01/25/2015  . Preterm delivery, delivered 01/04/2015  . H/O cesarean section 01/04/2015  . Fibroids 10/21/2012    Past Surgical History:  Procedure Laterality Date  . CESAREAN SECTION MULTI-GESTATIONAL  11/18/2014   Procedure: CESAREAN SECTION MULTI-GESTATIONAL;  Surgeon: Donnamae Jude, MD;  Location: [facility];  Service: Obstetrics;;  . MYOMECTOMY N/A 04/24/2017   Procedure: ABDOMINAL MYOMECTOMY OF UTERUS;  Surgeon: Florian Buff, MD;  Location: [facility];  Service: Gynecology;  Laterality: N/A;  . NO PAST SURGERIES    . REMOVAL OF NON VAGINAL CONTRACEPTIVE DEVICE Right 04/24/2017   Procedure: REMOVAL OF [place];  Surgeon: Florian Buff, MD;  Location:  [facility];  Service: Gynecology;  Laterality: Right;     OB History    Gravida  3   Para  2   Term  1   Preterm  1   AB  1   Living  3     SAB  1   TAB      Ectopic      Multiple  1   Live Births  3            Home Medications    Prior to Admission medications   Medication Sig Start Date End Date Taking? Authorizing Provider  etonogestrel (NEXPLANON) 68 MG IMPL implant 1 each by Subdermal route once.    [provider]    Family History Family History  Problem Relation Age of Onset  . Hypertension Father   . Diabetes Maternal Grandmother     Social History Social History   Tobacco Use  . Smoking status: Never Smoker  . Smokeless tobacco: Never Used  Substance Use Topics  . Alcohol use: No  . Drug use: No     Allergies   Peanuts [peanut oil]   Review of Systems Review of Systems  Constitutional: Positive for chills. Negative for activity change.       All ROS Neg except as noted in HPI  HENT: Positive for congestion, sinus pain and sneezing. Negative for nosebleeds.   Eyes: Negative for photophobia and discharge.  Respiratory: Positive for cough and wheezing. Negative for shortness of  breath.   Cardiovascular: Negative for chest pain and palpitations.  Gastrointestinal: Positive for diarrhea and vomiting. Negative for abdominal pain and blood in stool.  Genitourinary: Negative for dysuria, frequency and hematuria.  Musculoskeletal: Negative for arthralgias, back pain and neck pain.  Skin: Negative.   Neurological: Positive for headaches. Negative for dizziness, seizures and speech difficulty.  Psychiatric/Behavioral: Negative for confusion and hallucinations.     Physical Exam Updated Vital Signs BP (!) 92/59 (BP Location: Right Arm)   Pulse 95   Temp (!) 100.4 F (38 C) (Oral)   Resp 18   Ht 5' (1.524 m)   Wt 98.9 kg (218 lb)   LMP 08/13/2017 Comment: has nexplanon  SpO2 96%   BMI 42.58 kg/m   Physical Exam    Constitutional: She is oriented to person, place, and time. She appears well-developed and well-nourished.  Non-toxic appearance.  HENT:  Head: Normocephalic.  Right Ear: Tympanic membrane and external ear normal.  Left Ear: Tympanic membrane and external ear normal.  Nasal congestion present.  Eyes: Pupils are equal, round, and reactive to light. EOM and lids are normal.  Neck: Normal range of motion. Neck supple. Carotid bruit is not present.  Cardiovascular: Normal rate, regular rhythm, normal heart sounds, intact distal pulses and normal pulses.  Pulmonary/Chest: Breath sounds normal. No respiratory distress.  Abdominal: Soft. Bowel sounds are normal. There is no tenderness. There is no guarding.  Musculoskeletal: Normal range of motion.  Lymphadenopathy:       Head (right side): No submandibular adenopathy present.       Head (left side): No submandibular adenopathy present.    She has no cervical adenopathy.  Neurological: She is alert and oriented to person, place, and time. She has normal strength. No cranial nerve deficit or sensory deficit.  Skin: Skin is warm and dry.  Psychiatric: She has a normal mood and affect. Her speech is normal.  Nursing note and vitals reviewed.    ED Treatments / Results  Labs (all labs ordered are listed, but only abnormal results are displayed) Labs Reviewed - No data to display  EKG None  Radiology No results found.  Procedures Procedures (including critical care time)  Medications Ordered in ED Medications - No data to display   Initial Impression / Assessment and Plan / ED Course  I have reviewed the triage vital signs and the nursing notes.  Pertinent labs & imaging results that were available during my care of the patient were reviewed by me and considered in my medical decision making (see chart for details).       Final Clinical Impressions(s) / ED Diagnoses MDM  Vital signs reviewed.  Temperature improved and blood  pressure improved during emergency department visit.  Pulse oximetry is 96-98%.  Chest x-ray is negative for acute problem.  Examination and endorses viral illness.  Prescription for Claritin-D, Hycodan syrup, Decadron, and Zofran given to patient.  We discussed the importance of good hydration.  We discussed the importance of frequent handwashing by the entire family.  Patient will follow up with her primary physician or return to the emergency department if any changes, problems, or concerns.  Patient is in agreement with this plan.   Final diagnoses:  Viral illness    ED Discharge Orders        Ordered    dexamethasone (DECADRON) 4 MG tablet  2 times daily with meals     08/20/17 1101    ondansetron (ZOFRAN) 4 MG tablet  Every 6 hours     08/20/17 1101    loratadine-pseudoephedrine (CLARITIN-D 12 HOUR) 5-120 MG tablet  2 times daily     08/20/17 1101    HYDROcodone-homatropine (HYCODAN) 5-1.5 MG/5ML syrup  Every 6 hours PRN     08/20/17 1101       Lily Kocher, PA-C 08/21/17 1708    Fredia Sorrow, MD 08/22/17 [phone]

## 2017-08-20 NOTE — ED Triage Notes (Signed)
PT c/o generalized body aches, fever, cough, sinus congestion and vomiting x1 this am. PT states her children were dx with flu x3 days ago.

## 2017-08-20 NOTE — ED Notes (Signed)
EDP at bedside  

## 2017-08-20 NOTE — ED Notes (Signed)
Pt transported to xray 

## 2017-11-29 ENCOUNTER — Encounter (HOSPITAL_COMMUNITY): Payer: Self-pay | Admitting: *Deleted

## 2017-11-29 ENCOUNTER — Emergency Department (HOSPITAL_COMMUNITY)
Admission: EM | Admit: 2017-11-29 | Discharge: 2017-11-29 | Discharge status: Against Medical Advice | Payer: BLUE CROSS/BLUE SHIELD | Attending: Emergency Medicine | Admitting: Emergency Medicine

## 2017-11-29 ENCOUNTER — Other Ambulatory Visit: Payer: Self-pay

## 2017-11-29 DIAGNOSIS — Z5321 Procedure and treatment not carried out due to patient leaving prior to being seen by health care provider: Secondary | ICD-10-CM | POA: Insufficient documentation

## 2017-11-29 DIAGNOSIS — R1033 Periumbilical pain: Secondary | ICD-10-CM | POA: Diagnosis not present

## 2017-11-29 DIAGNOSIS — R350 Frequency of micturition: Secondary | ICD-10-CM | POA: Insufficient documentation

## 2017-11-29 DIAGNOSIS — R202 Paresthesia of skin: Secondary | ICD-10-CM | POA: Diagnosis not present

## 2017-11-29 LAB — URINALYSIS, ROUTINE W REFLEX MICROSCOPIC
BILIRUBIN URINE: NEGATIVE
GLUCOSE, UA: NEGATIVE mg/dL
HGB URINE DIPSTICK: NEGATIVE
Ketones, ur: NEGATIVE mg/dL
Leukocytes, UA: NEGATIVE
Nitrite: NEGATIVE
PROTEIN: NEGATIVE mg/dL
Specific Gravity, Urine: 1.025 (ref 1.005–1.030)
pH: 6 (ref 5.0–8.0)

## 2017-11-29 LAB — PREGNANCY, URINE: PREG TEST UR: NEGATIVE

## 2017-11-29 NOTE — ED Notes (Signed)
Per lab, they have attempted to collect blood on patient, but patient has not been in the room for approximately 20 minutes. Patient still is not in room. Patient left without informing staff.

## 2017-11-29 NOTE — ED Triage Notes (Signed)
Painful urination with frequency, states a "bad tingle", for 3 days. No fever.  Patient has had one UTI in the past approximately 3 years ago.

## 2017-11-29 NOTE — ED Provider Notes (Signed)
[facility] EMERGENCY DEPARTMENT Provider Note   CSN: [id number] Arrival date & time: 11/29/17  0906     History   Chief Complaint Chief Complaint  Patient presents with  . Urinary Frequency    HPI Baneen DESTINE ZIRKLE is a 27 y.o. female.  HPI   Talecia NATALEY BAHRI is a 27 y.o. female who presents to the Emergency Department complaining of pain and tingling with urination.  Symptoms began three days ago.  Her symptoms also include increased frequency and pressure sensation to her lower mid abdomen.  She denies fever, chills, back pain, vomiting, abdnormal vaginal bleeding or discharge. no new sexual partners   Past Medical History:  Diagnosis Date  . BV (bacterial vaginosis)   . Menorrhagia   . Pregnant   . Uterine fibroid in pregnancy   . Uterine myoma   . UTI (urinary tract infection)   . Yeast infection     Patient Active Problem List   Diagnosis Date Noted  . S/P myomectomy 04/24/2017  . Dysmenorrhea 03/25/2017  . Menometrorrhagia 03/25/2017  . Moody 03/25/2017  . Nexplanon in place 03/25/2017  . Enlarged uterus 03/25/2017  . Encounter for gynecological examination with Papanicolaou smear of cervix 01/25/2015  . Preterm delivery, delivered 01/04/2015  . H/O cesarean section 01/04/2015  . Fibroids 10/21/2012    Past Surgical History:  Procedure Laterality Date  . CESAREAN SECTION MULTI-GESTATIONAL  11/18/2014   Procedure: CESAREAN SECTION MULTI-GESTATIONAL;  Surgeon: Donnamae Jude, MD;  Location: [place] [facility];  Service: Obstetrics;;  . MYOMECTOMY N/A 04/24/2017   Procedure: ABDOMINAL MYOMECTOMY OF UTERUS;  Surgeon: Florian Buff, MD;  Location: [facility];  Service: Gynecology;  Laterality: N/A;  . NO PAST SURGERIES    . REMOVAL OF NON VAGINAL CONTRACEPTIVE DEVICE Right 04/24/2017   Procedure: REMOVAL OF [place];  Surgeon: Florian Buff, MD;  Location: [facility];  Service: Gynecology;  Laterality: Right;     OB History    Gravida  3   Para  2   Term  1   Preterm  1   AB  1   Living  3     SAB  1   TAB      Ectopic      Multiple  1   Live Births  3            Home Medications    Prior to Admission medications   Medication Sig Start Date End Date Taking? Authorizing Provider  dexamethasone (DECADRON) 4 MG tablet Take 1 tablet (4 mg total) by mouth 2 (two) times daily with a meal. Patient not taking: Reported on 11/29/2017 08/20/17   Lily Kocher, PA-C  etonogestrel (NEXPLANON) 68 MG IMPL implant 1 each by Subdermal route once.    [provider]  HYDROcodone-homatropine (HYCODAN) 5-1.5 MG/5ML syrup Take 5 mLs by mouth every 6 (six) hours as needed for cough. Patient not taking: Reported on 11/29/2017 08/20/17   Lily Kocher, PA-C  loratadine-pseudoephedrine (CLARITIN-D 12 HOUR) 5-120 MG tablet Take 1 tablet by mouth 2 (two) times daily. Patient not taking: Reported on 11/29/2017 08/20/17   Lily Kocher, PA-C  ondansetron (ZOFRAN) 4 MG tablet Take 1 tablet (4 mg total) by mouth every 6 (six) hours. Patient not taking: Reported on 11/29/2017 08/20/17   Lily Kocher, PA-C    Family History Family History  Problem Relation Age of Onset  . Hypertension Father   . Diabetes Maternal Grandmother     Social History Social  History   Tobacco Use  . Smoking status: Never Smoker  . Smokeless tobacco: Never Used  Substance Use Topics  . Alcohol use: No  . Drug use: No     Allergies   Peanuts [peanut oil]   Review of Systems Review of Systems  Constitutional: Negative for activity change, appetite change, chills and fever.  Respiratory: Negative for chest tightness and shortness of breath.   Gastrointestinal: Negative for abdominal pain (lower abdominal pain), nausea and vomiting.  Genitourinary: Positive for dysuria, frequency and urgency. Negative for decreased urine volume, difficulty urinating, flank pain, genital sores, menstrual problem, vaginal bleeding and vaginal discharge.  Musculoskeletal: Negative  for back pain.  Skin: Negative for rash.  Neurological: Negative for dizziness, weakness and numbness.  Hematological: Negative for adenopathy.  All other systems reviewed and are negative.    Physical Exam Updated Vital Signs BP 103/67 (BP Location: Right Arm)   Pulse 82   Temp 98.8 F (37.1 C) (Oral)   Resp 14   Ht 5' (1.524 m)   Wt 98.4 kg (217 lb)   BMI 42.38 kg/m   Physical Exam  Constitutional: She appears well-developed and well-nourished. No distress.  HENT:  Mouth/Throat: Oropharynx is clear and moist.  Cardiovascular: Normal rate.  Pulmonary/Chest: Effort normal and breath sounds normal.  Abdominal: Soft. She exhibits no distension. There is tenderness.  Abdomen is soft, mild suprapubic tenderness.  No guarding.  No CVA tenderness.  Musculoskeletal: Normal range of motion.  Neurological: She is alert.  Skin: No rash noted.  Nursing note and vitals reviewed.    ED Treatments / Results  Labs (all labs ordered are listed, but only abnormal results are displayed) Labs Reviewed  WET PREP, GENITAL  URINALYSIS, ROUTINE W REFLEX MICROSCOPIC  PREGNANCY, URINE  BASIC METABOLIC PANEL  CBC WITH DIFFERENTIAL/PLATELET  GC/CHLAMYDIA PROBE AMP () NOT AT [facility]    EKG None  Radiology No results found.  Procedures Procedures (including critical care time)  Medications Ordered in ED Medications - No data to display   Initial Impression / Assessment and Plan / ED Course  I have reviewed the triage vital signs and the nursing notes.  Pertinent labs & imaging results that were available during my care of the patient were reviewed by me and considered in my medical decision making (see chart for details).    69  Advised pt of U/A results and without evidence of UTI, the need for a pelvic exam. Pt verbalized understanding.     63  Returned for pelvic, and pt has left the dept without informing me or nursing.    Final Clinical Impressions(s) / ED  Diagnoses   Final diagnoses:  Eloped from emergency department    ED Discharge Orders    None       Kem Parkinson, Hershal Coria 11/29/17 2133    Daleen Bo, MD 12/01/17 1128

## 2017-12-25 ENCOUNTER — Ambulatory Visit: Payer: BLUE CROSS/BLUE SHIELD | Admitting: Obstetrics and Gynecology

## 2017-12-25 ENCOUNTER — Encounter: Payer: Self-pay | Admitting: Obstetrics and Gynecology

## 2017-12-25 VITALS — BP 118/70 | HR 70 | Ht 60.0 in | Wt 227.0 lb

## 2017-12-25 DIAGNOSIS — N898 Other specified noninflammatory disorders of vagina: Secondary | ICD-10-CM

## 2017-12-25 MED ORDER — METRONIDAZOLE 0.75 % VA GEL: 1.0000 | Freq: Every day | VAGINAL | 3 refills | Status: DC

## 2017-12-25 NOTE — Progress Notes (Signed)
[place], well appearing, and in no distress and oriented to person, place, and timeatient ID: Jacqueline Underwood, female   DOB: 1990/06/01, 27 y.o.   MRN: [id number]    [facility] Visit  @DATE @            Patient name: Jacqueline Underwood MRN [id number]  Date of birth: 07-08-1990  CC & HPI:  Jacqueline Underwood is a 27 y.o. female presenting today for milky white vaginal discharge for past 2 weeks. She has no new sexual partner all she has changed is bath soap. She thought it was a UTI due to having recurring uti's, but did not have uti . She has 3 children and spoke of removing Nexplanon in her arm and getting tubes tied.  ROS:  ROS +vaginal discharge -fever -chills All systems are negative except as noted in the HPI and PMH.   Pertinent History Reviewed:   Reviewed: Significant for UTI, C-section Medical         Past Medical History:  Diagnosis Date  . BV (bacterial vaginosis)   . Menorrhagia   . Pregnant   . Uterine fibroid in pregnancy   . Uterine myoma   . UTI (urinary tract infection)   . Yeast infection                               Surgical Hx:    Past Surgical History:  Procedure Laterality Date  . CESAREAN SECTION MULTI-GESTATIONAL  11/18/2014   Procedure: CESAREAN SECTION MULTI-GESTATIONAL;  Surgeon: Donnamae Jude, MD;  Location: [facility] [facility];  Service: Obstetrics;;  . MYOMECTOMY N/A 04/24/2017   Procedure: ABDOMINAL MYOMECTOMY OF UTERUS;  Surgeon: Florian Buff, MD;  Location: [facility];  Service: Gynecology;  Laterality: N/A;  . NO PAST SURGERIES    . REMOVAL OF NON VAGINAL CONTRACEPTIVE DEVICE Right 04/24/2017   Procedure: REMOVAL OF [place];  Surgeon: Florian Buff, MD;  Location: [facility];  Service: Gynecology;  Laterality: Right;   Medications: Reviewed & Updated - see associated section                       Current Outpatient Medications:  .  etonogestrel (NEXPLANON) 68 MG IMPL implant, 1 each by Subdermal route once., Disp: , Rfl:  .   loratadine-pseudoephedrine (CLARITIN-D 12 HOUR) 5-120 MG tablet, Take 1 tablet by mouth 2 (two) times daily., Disp: 20 tablet, Rfl: 0 .  dexamethasone (DECADRON) 4 MG tablet, Take 1 tablet (4 mg total) by mouth 2 (two) times daily with a meal. (Patient not taking: Reported on 11/29/2017), Disp: 10 tablet, Rfl: 0 .  HYDROcodone-homatropine (HYCODAN) 5-1.5 MG/5ML syrup, Take 5 mLs by mouth every 6 (six) hours as needed for cough. (Patient not taking: Reported on 11/29/2017), Disp: 120 mL, Rfl: 0 .  ondansetron (ZOFRAN) 4 MG tablet, Take 1 tablet (4 mg total) by mouth every 6 (six) hours. (Patient not taking: Reported on 11/29/2017), Disp: 12 tablet, Rfl: 0   Social History: Reviewed -  reports that she has never smoked. She has never used smokeless tobacco.  Objective Findings:  Vitals: Blood pressure 118/70, pulse 70, height 5' (1.524 m), weight 227 lb (103 kg).  PHYSICAL EXAMINATION General appearance - alert, well appearing, and in no distress and oriented to person, place, and time Mental status - alert, oriented to person, place, and time, normal mood, behavior, speech, dress, motor activity,  and thought processes, affect appropriate to mood  PELVIC External genitalia - Vagina - discharge, milky white with odor Cervix -  normal, bimanual not done Uterus - normal appearing Adnexa- Negative Wet [place] - moderate white cells few clues, neg trick positiv whiff.   Assessment & Plan:   A:  1.  Bacterial Vaginosis 2. Considering permanent sterilization  P:  1.  Rx Metrogel, check Gonorrhea, Chlamydia.    By signing my name below, I, Samul Dada, attest that this documentation has been prepared under the direction and in the presence of Jonnie Kind, MD. Electronically Signed: Francis. 12/25/17. 4:27 PM.  I personally performed the services described in this documentation, which was SCRIBED in my presence. The recorded information has been reviewed and considered  accurate. It has been edited as necessary during review. Jonnie Kind, MD

## 2017-12-26 DIAGNOSIS — N898 Other specified noninflammatory disorders of vagina: Secondary | ICD-10-CM | POA: Diagnosis not present

## 2017-12-29 LAB — GC/CHLAMYDIA PROBE AMP
Chlamydia trachomatis, NAA: NEGATIVE
Neisseria gonorrhoeae by PCR: NEGATIVE

## 2018-01-15 ENCOUNTER — Ambulatory Visit (INDEPENDENT_AMBULATORY_CARE_PROVIDER_SITE_OTHER): Payer: BLUE CROSS/BLUE SHIELD | Admitting: Women's Health

## 2018-01-15 ENCOUNTER — Encounter: Payer: Self-pay | Admitting: Women's Health

## 2018-01-15 VITALS — BP 102/60 | HR 74 | Ht 60.0 in | Wt 224.0 lb

## 2018-01-15 DIAGNOSIS — Z3009 Encounter for other general counseling and advice on contraception: Secondary | ICD-10-CM | POA: Diagnosis not present

## 2018-01-15 NOTE — Progress Notes (Signed)
   GYN VISIT Patient name: Jacqueline Underwood MRN [id number]  Date of birth: 1991/04/08 Chief Complaint:   Follow-up (nexplanon removal)  History of Present Illness:   Jacqueline Underwood is a 27 y.o. [phone] African American female being seen today wanting to have Nexplanon removed d/t 'blowing up' since it was put in 07/04/17.  Review of chart reveals 5lb wt gain. Pt very shocked by this, thought it was a lot more. Does not ever want any more children, was wanting BTL, but doesn't want to start having periods again, as she has been amenorrheic w/ Nexplanon. After all of this was discussed, pt wants to leave Nexplanon in.     No LMP recorded. Patient has had an implant.  Last pap 03/25/17. Results were:  normal Review of Systems:   Pertinent items are noted in HPI Denies fever/chills, dizziness, headaches, visual disturbances, fatigue, shortness of breath, chest pain, abdominal pain, vomiting, abnormal vaginal discharge/itching/odor/irritation, problems with periods, bowel movements, urination, or intercourse unless otherwise stated above.  Pertinent History Reviewed:  Reviewed past medical,surgical, social, obstetrical and family history.  Reviewed problem list, medications and allergies. Physical Assessment:   Vitals:   01/15/18 1554  BP: 102/60  Pulse: 74  Weight: 224 lb (101.6 kg)  Height: 5' (1.524 m)  Body mass index is 43.75 kg/m.       Physical Examination:   General appearance: alert, well appearing, and in no distress  Mental status: alert, oriented to person, place, and time  Skin: warm & dry   Cardiovascular: normal heart rate noted  Respiratory: normal respiratory effort, no distress  Abdomen: soft, non-tender   Pelvic: examination not indicated  Extremities: no edema   No results found for this or any previous visit (from the past 24 hour(s)).  Assessment & Plan:  1) Contraception counseling> wants to leave nexplanon in for now  2) BMI 43> recommended weight loss,  discussed cutting back calories/carbs, increasing exercise/activity  Meds: No orders of the defined types were placed in this encounter.   No orders of the defined types were placed in this encounter.   Return in about 3 months (around 04/17/2018) for Physical.  Roma Schanz CNM, WHNP-BC 01/15/2018 4:20 PM

## 2018-01-15 NOTE — Patient Instructions (Signed)

## 2018-03-26 ENCOUNTER — Other Ambulatory Visit: Payer: BLUE CROSS/BLUE SHIELD | Admitting: Adult Health

## 2018-03-31 ENCOUNTER — Other Ambulatory Visit: Payer: BLUE CROSS/BLUE SHIELD | Admitting: Women's Health

## 2018-04-08 ENCOUNTER — Encounter: Payer: BLUE CROSS/BLUE SHIELD | Admitting: Women's Health

## 2018-04-13 ENCOUNTER — Emergency Department (HOSPITAL_COMMUNITY): Payer: BLUE CROSS/BLUE SHIELD

## 2018-04-13 ENCOUNTER — Encounter (HOSPITAL_COMMUNITY): Payer: Self-pay

## 2018-04-13 ENCOUNTER — Emergency Department (HOSPITAL_COMMUNITY)
Admission: EM | Admit: 2018-04-13 | Discharge: 2018-04-13 | Disposition: A | Discharge status: Home / Self Care | Payer: BLUE CROSS/BLUE SHIELD | Attending: Emergency Medicine | Admitting: Emergency Medicine

## 2018-04-13 ENCOUNTER — Other Ambulatory Visit: Payer: Self-pay

## 2018-04-13 DIAGNOSIS — R079 Chest pain, unspecified: Secondary | ICD-10-CM | POA: Diagnosis not present

## 2018-04-13 DIAGNOSIS — Z79899 Other long term (current) drug therapy: Secondary | ICD-10-CM | POA: Insufficient documentation

## 2018-04-13 DIAGNOSIS — Z91010 Allergy to peanuts: Secondary | ICD-10-CM | POA: Insufficient documentation

## 2018-04-13 LAB — BASIC METABOLIC PANEL
Anion gap: 6 (ref 5–15)
BUN: 16 mg/dL (ref 6–20)
CO2: 23 mmol/L (ref 22–32)
Calcium: 9 mg/dL (ref 8.9–10.3)
Chloride: 108 mmol/L (ref 98–111)
Creatinine, Ser: 0.71 mg/dL (ref 0.44–1.00)
GFR calc Af Amer: >60 mL/min (ref >60)
GFR calc non Af Amer: >60 mL/min (ref >60)
Glucose, Bld: 82 mg/dL (ref 70–99)
Potassium: 3.6 mmol/L (ref 3.5–5.1)
Sodium: 137 mmol/L (ref 135–145)

## 2018-04-13 LAB — CBC
HCT: 37 % (ref 36.0–46.0)
Hemoglobin: 11.9 g/dL — ABNORMAL LOW (ref 12.0–15.0)
MCH: 30.4 pg (ref 26.0–34.0)
MCHC: 32.2 g/dL (ref 30.0–36.0)
MCV: 94.4 fL (ref 80.0–100.0)
Platelets: 265 10*3/uL (ref 150–400)
RBC: 3.92 MIL/uL (ref 3.87–5.11)
RDW: 11.3 % — ABNORMAL LOW (ref 11.5–15.5)
WBC: 6 10*3/uL (ref 4.0–10.5)
nRBC: 0 % (ref 0.0–0.2)

## 2018-04-13 LAB — TROPONIN I: Troponin I: <0.03 ng/mL (ref <0.03)

## 2018-04-13 LAB — HCG, QUANTITATIVE, PREGNANCY: hCG, Beta Chain, Quant, S: <1 m[IU]/mL (ref <5)

## 2018-04-13 LAB — I-STAT BETA HCG BLOOD, ED (MC, WL, AP ONLY): I-stat hCG, quantitative: <5 m[IU]/mL (ref <5)

## 2018-04-13 NOTE — ED Triage Notes (Addendum)
Pt reports that cp began last night while she was lying down. Pain is described as tight and constant. Some lightheadedness. Hurts to deep breath

## 2018-04-22 ENCOUNTER — Ambulatory Visit (INDEPENDENT_AMBULATORY_CARE_PROVIDER_SITE_OTHER): Payer: Medicaid Other | Admitting: Obstetrics and Gynecology

## 2018-04-22 ENCOUNTER — Other Ambulatory Visit (HOSPITAL_COMMUNITY)
Admission: RE | Admit: 2018-04-22 | Discharge: 2018-04-22 | Disposition: A | Discharge status: Home / Self Care | Payer: Self-pay | Source: Ambulatory Visit | Attending: Obstetrics and Gynecology | Admitting: Obstetrics and Gynecology

## 2018-04-22 ENCOUNTER — Encounter: Payer: Self-pay | Admitting: Obstetrics and Gynecology

## 2018-04-22 VITALS — BP 99/64 | HR 76 | Ht 60.0 in | Wt 224.8 lb

## 2018-04-22 DIAGNOSIS — Z01419 Encounter for gynecological examination (general) (routine) without abnormal findings: Secondary | ICD-10-CM | POA: Insufficient documentation

## 2018-04-22 DIAGNOSIS — Z309 Encounter for contraceptive management, unspecified: Secondary | ICD-10-CM

## 2018-04-22 NOTE — Progress Notes (Addendum)
Patient ID: Jacqueline Underwood, female   DOB: 12-04-1990, 27 y.o.   MRN: [id number]   Assessment:  Annual Gyn Exam Plan:  1. pap smear done, next pap due 3 yrs 2. return annually or prn 3    Annual mammogram advised after age 47 4.   BTL papers signed today. Schedule BTL for January and remove Nexplanon at the same time 5.   F/U in 4 wks for pre-op Subjective:  Jacqueline Underwood is a 27 y.o. female [phone] who presents for annual exam. No LMP recorded. Patient has had an implant. (Nexplanon in right arm) The patient has no complaints today. Her last pap was 03/25/2017 and was normal. She wants her Nexplanon removed and would like a BTL. She had twins by C section but had her other child vaginally.  The following portions of the patient's history were reviewed and updated as appropriate: allergies, current medications, past family history, past medical history, past social history, past surgical history and problem list. Past Medical History:  Diagnosis Date  . BV (bacterial vaginosis)   . Menorrhagia   . Pregnant   . Uterine fibroid in pregnancy   . Uterine myoma   . UTI (urinary tract infection)   . Yeast infection     Past Surgical History:  Procedure Laterality Date  . CESAREAN SECTION MULTI-GESTATIONAL  11/18/2014   Procedure: CESAREAN SECTION MULTI-GESTATIONAL;  Surgeon: Donnamae Jude, MD;  Location: [place] [facility];  Service: Obstetrics;;  . MYOMECTOMY N/A 04/24/2017   Procedure: ABDOMINAL MYOMECTOMY OF UTERUS;  Surgeon: Florian Buff, MD;  Location: [facility];  Service: Gynecology;  Laterality: N/A;  . NO PAST SURGERIES    . REMOVAL OF NON VAGINAL CONTRACEPTIVE DEVICE Right 04/24/2017   Procedure: REMOVAL OF [place];  Surgeon: Florian Buff, MD;  Location: [facility];  Service: Gynecology;  Laterality: Right;     Current Outpatient Medications:  .  Biotin w/ Vitamins C & E (HAIR/SKIN/NAILS PO), Take 3 capsules by mouth daily., Disp: , Rfl:  .  etonogestrel (NEXPLANON) 68 MG IMPL  implant, 1 each by Subdermal route once., Disp: , Rfl:  .  metroNIDAZOLE (METROGEL) 0.75 % vaginal gel, Place 1 Applicatorful vaginally at bedtime. X 5 days. Then use 1-2 times monthly to reduce recurrent BV (Patient not taking: Reported on 04/13/2018), Disp: 70 g, Rfl: 3  Review of Systems Constitutional: negative Gastrointestinal: negative Genitourinary: negative  Objective:  BP 99/64 (BP Location: Right Arm, Patient Position: Sitting, Cuff Size: Normal)   Pulse 76   Ht 5' (1.524 m)   Wt 224 lb 12.8 oz (102 kg)   BMI 43.90 kg/m    BMI: Body mass index is 43.9 kg/m.  General Appearance: Alert, appropriate appearance for age. No acute distress HEENT: Grossly normal Neck / Thyroid:  Cardiovascular: RRR; normal S1, S2, no murmur Lungs: CTA bilaterally Back: No CVAT Breast Exam: not done Gastrointestinal: Soft, non-tender, no masses or organomegaly Pelvic Exam: Vulva and vagina appear normal. Bimanual exam reveals normal uterus and adnexa. Lymphatic Exam: Non-palpable nodes in neck, clavicular, axillary, or inguinal regions  Skin: no rash or abnormalities Neurologic: Normal gait and speech, no tremor  Psychiatric: Alert and oriented, appropriate affect.  Urinalysis:Not done  By signing my name below, I, De Burrs, attest that this documentation has been prepared under the direction and in the presence of Jonnie Kind, MD. Electronically Signed: De Burrs, Medical Scribe. 04/22/18. 4:42 PM.  Attestation of Attending Supervision of Advanced Practitioner: Evaluation  and management procedures were performed by the PA/NP/CNM/OB Fellow, and I was available for supervision/collaboration. Chart reviewed and agree with management and plan.  Jonnie Kind 04/22/2018 4:48 PM

## 2018-04-23 NOTE — ED Provider Notes (Signed)
[facility] EMERGENCY DEPARTMENT Provider Note   CSN: [id number] Arrival date & time: 04/13/18  3546     History   Chief Complaint Chief Complaint  Patient presents with  . Chest Pain    HPI Jacqueline Underwood is a 27 y.o. female.  HPI   27 year old female chest pain.  Onset last night while she was laying down flat on her back.  It has been persistent since then.  Describes a sensation of tightness.  Worse with certain movements and deep breathing.  She does not feel short of breath though.  No cough.  No unusual leg pain or swelling.  No fevers or chills.  Past Medical History:  Diagnosis Date  . BV (bacterial vaginosis)   . Menorrhagia   . Pregnant   . Uterine fibroid in pregnancy   . Uterine myoma   . UTI (urinary tract infection)   . Yeast infection     Patient Active Problem List   Diagnosis Date Noted  . S/P myomectomy 04/24/2017  . Dysmenorrhea 03/25/2017  . Menometrorrhagia 03/25/2017  . Moody 03/25/2017  . Nexplanon in place 03/25/2017  . Enlarged uterus 03/25/2017  . Preterm delivery, delivered 01/04/2015  . H/O cesarean section 01/04/2015  . Fibroids 10/21/2012    Past Surgical History:  Procedure Laterality Date  . CESAREAN SECTION MULTI-GESTATIONAL  11/18/2014   Procedure: CESAREAN SECTION MULTI-GESTATIONAL;  Surgeon: Donnamae Jude, MD;  Location: [place] [facility];  Service: Obstetrics;;  . MYOMECTOMY N/A 04/24/2017   Procedure: ABDOMINAL MYOMECTOMY OF UTERUS;  Surgeon: Florian Buff, MD;  Location: [facility];  Service: Gynecology;  Laterality: N/A;  . NO PAST SURGERIES    . REMOVAL OF NON VAGINAL CONTRACEPTIVE DEVICE Right 04/24/2017   Procedure: REMOVAL OF [place];  Surgeon: Florian Buff, MD;  Location: [facility];  Service: Gynecology;  Laterality: Right;     OB History    Gravida  3   Para  2   Term  1   Preterm  1   AB  1   Living  3     SAB  1   TAB      Ectopic      Multiple  1   Live Births  3            Home  Medications    Prior to Admission medications   Medication Sig Start Date End Date Taking? Authorizing Provider  Biotin w/ Vitamins C & E (HAIR/SKIN/NAILS PO) Take 3 capsules by mouth daily.   Yes [provider]  etonogestrel (NEXPLANON) 68 MG IMPL implant 1 each by Subdermal route once.   Yes [provider]  metroNIDAZOLE (METROGEL) 0.75 % vaginal gel Place 1 Applicatorful vaginally at bedtime. X 5 days. Then use 1-2 times monthly to reduce recurrent BV Patient not taking: Reported on 04/13/2018 12/25/17   Jonnie Kind, MD    Family History Family History  Problem Relation Age of Onset  . Hypertension Father   . Diabetes Maternal Grandmother     Social History Social History   Tobacco Use  . Smoking status: Never Smoker  . Smokeless tobacco: Never Used  Substance Use Topics  . Alcohol use: Yes    Comment: weekends  . Drug use: No     Allergies   Peanuts [peanut oil]   Review of Systems Review of Systems  All systems reviewed and negative, other than as noted in HPI.  Physical Exam Updated Vital Signs BP Marland Kitchen)  96/53   Pulse (!) 59   Temp 98.3 F (36.8 C) (Oral)   Resp 17   Ht 5' (1.524 m)   Wt 101.6 kg   SpO2 100%   BMI 43.75 kg/m   Physical Exam  Constitutional: She appears well-developed and well-nourished. No distress.  HENT:  Head: Normocephalic and atraumatic.  Eyes: Conjunctivae are normal. Right eye exhibits no discharge. Left eye exhibits no discharge.  Neck: Neck supple.  Cardiovascular: Normal rate, regular rhythm and normal heart sounds. Exam reveals no gallop and no friction rub.  No murmur heard. Pulmonary/Chest: Effort normal and breath sounds normal. No respiratory distress.  Abdominal: Soft. She exhibits no distension. There is no tenderness.  Musculoskeletal: She exhibits no edema or tenderness.  Lower extremities symmetric as compared to each other. No calf tenderness. Negative Homan's. No palpable cords.     Neurological: She is alert.  Skin: Skin is warm and dry.  Psychiatric: She has a normal mood and affect. Her behavior is normal. Thought content normal.  Nursing note and vitals reviewed.    ED Treatments / Results  Labs (all labs ordered are listed, but only abnormal results are displayed) Labs Reviewed  CBC - Abnormal; Notable for the following components:      Result Value   Hemoglobin 11.9 (*)    RDW 11.3 (*)    All other components within normal limits  BASIC METABOLIC PANEL  TROPONIN I  HCG, QUANTITATIVE, PREGNANCY  I-STAT BETA HCG BLOOD, ED ([facility], [facility], [facility] ONLY)    EKG EKG Interpretation  Date/Time:  Sunday April 13 2018 09:18:41 EST Ventricular Rate:  62 PR Interval:    QRS Duration: 91 QT Interval:  410 QTC Calculation: 417 R Axis:   64 Text Interpretation:  Sinus rhythm Borderline T abnormalities, anterior leads Confirmed by Christella App ([id number]) on 04/13/2018 9:28:18 AM   Radiology No results found.   Dg Chest 2 View  Result Date: 04/13/2018 CLINICAL DATA:  Chest pain. EXAM: CHEST - 2 VIEW COMPARISON:  08/20/2017. FINDINGS: Normal sized heart. Clear lungs. Mild peribronchial thickening. Normal appearing bones. IMPRESSION: Mild bronchitic changes. Electronically Signed   By: Steven  Reid M.D.   On: 04/13/2018 10:19    Procedures Procedures (including critical care time)  Medications Ordered in ED Medications - No data to display   Initial Impression / Assessment and Plan / ED Course  I have reviewed the triage vital signs and the nursing notes.  Pertinent labs & imaging results that were available during my care of the patient were reviewed by me and considered in my medical decision making (see chart for details).     27  year old female chest pain.  Doubt ACS, PE, dissection or other emergent process.  Afebrile.  Well-appearing.  Reassuring exam.  Chest x-ray, EKG and labs pretty unremarkable.  Symptom medic treatment.  Return precautions were  discussed.  Final Clinical Impressions(s) / ED Diagnoses   Final diagnoses:  Nonspecific chest pain    ED Discharge Orders    None       Virgel Manifold, MD 04/23/18 0025

## 2018-04-28 LAB — CYTOLOGY - PAP
Chlamydia: NEGATIVE
DIAGNOSIS: NEGATIVE
Neisseria Gonorrhea: NEGATIVE

## 2018-04-30 ENCOUNTER — Encounter: Payer: Medicaid Other | Admitting: Advanced Practice Midwife

## 2018-05-29 ENCOUNTER — Ambulatory Visit (INDEPENDENT_AMBULATORY_CARE_PROVIDER_SITE_OTHER): Payer: BLUE CROSS/BLUE SHIELD | Admitting: Obstetrics and Gynecology

## 2018-05-29 ENCOUNTER — Encounter: Payer: Self-pay | Admitting: Obstetrics and Gynecology

## 2018-05-29 VITALS — BP 106/44 | HR 59 | Ht 60.0 in

## 2018-05-29 DIAGNOSIS — Z3009 Encounter for other general counseling and advice on contraception: Secondary | ICD-10-CM

## 2018-05-29 DIAGNOSIS — Z01818 Encounter for other preprocedural examination: Secondary | ICD-10-CM

## 2018-05-29 DIAGNOSIS — Z113 Encounter for screening for infections with a predominantly sexual mode of transmission: Secondary | ICD-10-CM

## 2018-05-29 NOTE — Progress Notes (Signed)
**Note Jacqueline-Identified via Obfuscation** Patient ID: Jacqueline Underwood, female   DOB: 1990-07-27, 28 y.o.   MRN: [id number]  Preoperative History and Physical  Jacqueline Underwood is a 28 y.o. [id number] here for surgical management of BTL.   No significant preoperative concerns. Nexplanon was easily felt and is towards the back of her arm.  Proposed surgery: BTL + Nexplanon removal  Past Medical History:  Diagnosis Date  . BV (bacterial vaginosis)   . Menorrhagia   . Pregnant   . Uterine fibroid in pregnancy   . Uterine myoma   . UTI (urinary tract infection)   . Yeast infection    Past Surgical History:  Procedure Laterality Date  . CESAREAN SECTION MULTI-GESTATIONAL  11/18/2014   Procedure: CESAREAN SECTION MULTI-GESTATIONAL;  Surgeon: Donnamae Jude, MD;  Location: [facility];  Service: Obstetrics;;  . MYOMECTOMY N/A 04/24/2017   Procedure: ABDOMINAL MYOMECTOMY OF UTERUS;  Surgeon: Florian Buff, MD;  Location: [facility];  Service: Gynecology;  Laterality: N/A;  . NO PAST SURGERIES    . REMOVAL OF NON VAGINAL CONTRACEPTIVE DEVICE Right 04/24/2017   Procedure: REMOVAL OF [place];  Surgeon: Florian Buff, MD;  Location: [facility];  Service: Gynecology;  Laterality: Right;   OB History  Gravida Para Term Preterm AB Living  3 2 1 1 1 3   SAB TAB Ectopic Multiple Live Births  1     1 3     # Outcome Date GA Lbr Len/2nd Weight Sex Delivery Anes PTL Lv  3A Preterm 11/18/14 [redacted]w[redacted]d  3 lb 9.1 oz (1.619 kg) M CS-LTranv Spinal  LIV  3B Preterm 11/18/14 [redacted]w[redacted]d  4 lb 1.6 oz (1.86 kg) M CS-LTranv Spinal  LIV  2 Term 01/15/13 [redacted]w[redacted]d 18:53 / 01:11 7 lb 3.5 oz (3.274 kg) M Vag-Spont EPI  LIV     Birth Comments: within normal limits  1 SAB 2013          Patient denies any other pertinent gynecologic issues.   Current Outpatient Medications on File Prior to Visit  Medication Sig Dispense Refill  . etonogestrel (NEXPLANON) 68 MG IMPL implant 1 each by Subdermal route once.    . Biotin w/ Vitamins C & E (HAIR/SKIN/NAILS PO) Take 3 capsules by  mouth daily.     No current facility-administered medications on file prior to visit.    Allergies  Allergen Reactions  . Peanuts [Peanut Oil] Itching    Itching in throat.   Social History:   reports that she has never smoked. She has never used smokeless tobacco. She reports current alcohol use. She reports that she does not use drugs.  Family History  Problem Relation Age of Onset  . Hypertension Father   . Diabetes Maternal Grandmother    Review of Systems: Noncontributory  PHYSICAL EXAM: Blood pressure (!) 106/44, pulse (!) 59, height 5' (1.524 m). General appearance - alert, well appearing, and in no distress Chest - clear to auscultation, no wheezes, rales or rhonchi, symmetric air entry Heart - normal rate and regular rhythm Abdomen - soft, nontender, nondistended, no masses or organomegaly Pelvic - examination normal Extremities - peripheral pulses normal, no pedal edema, no clubbing or cyanosis  Labs: No results found for this or any previous visit (from the past 336 hour(s)).  Imaging Studies: No results found.  Assessment: Patient Active Problem List   Diagnosis Date Noted  . S/P myomectomy 04/24/2017  . Dysmenorrhea 03/25/2017  . Menometrorrhagia 03/25/2017  . Moody 03/25/2017  . Nexplanon in  place 03/25/2017  . Enlarged uterus 03/25/2017  . Preterm delivery, delivered 01/04/2015  . H/O cesarean section 01/04/2015  . Fibroids 10/21/2012    Plan: 1. GC/CHL screening done today 2. Patient will undergo surgical management with BTL + Nexplanon removal -- papers signed today. 3. Schedule pre-op in 6 weeks. Pt requests surgery March when she is on vacation from work.  By signing my name below, I, Jacqueline Underwood, attest that this documentation has been prepared under the direction and in the presence of Jonnie Kind, MD. Electronically Signed: De Underwood, Medical Scribe. 05/29/18. 4:45 PM.  I personally performed the services described in this  documentation, which was SCRIBED in my presence. The recorded information has been reviewed and considered accurate. It has been edited as necessary during review. Jonnie Kind, MD

## 2018-05-31 LAB — GC/CHLAMYDIA PROBE AMP
CHLAMYDIA, DNA PROBE: NEGATIVE
NEISSERIA GONORRHOEAE BY PCR: NEGATIVE

## 2018-06-25 ENCOUNTER — Other Ambulatory Visit: Payer: Self-pay

## 2018-06-25 ENCOUNTER — Emergency Department (HOSPITAL_COMMUNITY)
Admission: EM | Admit: 2018-06-25 | Discharge: 2018-06-25 | Disposition: A | Discharge status: Home / Self Care | Payer: Self-pay | Attending: Emergency Medicine | Admitting: Emergency Medicine

## 2018-06-25 ENCOUNTER — Encounter (HOSPITAL_COMMUNITY): Payer: Self-pay | Admitting: Emergency Medicine

## 2018-06-25 DIAGNOSIS — Y92008 Other place in unspecified non-institutional (private) residence as the place of occurrence of the external cause: Secondary | ICD-10-CM | POA: Insufficient documentation

## 2018-06-25 DIAGNOSIS — S70369A Insect bite (nonvenomous), unspecified thigh, initial encounter: Secondary | ICD-10-CM

## 2018-06-25 DIAGNOSIS — S70361A Insect bite (nonvenomous), right thigh, initial encounter: Secondary | ICD-10-CM | POA: Insufficient documentation

## 2018-06-25 DIAGNOSIS — Y999 Unspecified external cause status: Secondary | ICD-10-CM | POA: Insufficient documentation

## 2018-06-25 DIAGNOSIS — S70362A Insect bite (nonvenomous), left thigh, initial encounter: Secondary | ICD-10-CM | POA: Insufficient documentation

## 2018-06-25 DIAGNOSIS — Y9389 Activity, other specified: Secondary | ICD-10-CM | POA: Insufficient documentation

## 2018-06-25 DIAGNOSIS — W57XXXA Bitten or stung by nonvenomous insect and other nonvenomous arthropods, initial encounter: Secondary | ICD-10-CM | POA: Insufficient documentation

## 2018-06-25 DIAGNOSIS — Z79899 Other long term (current) drug therapy: Secondary | ICD-10-CM | POA: Insufficient documentation

## 2018-06-25 MED ORDER — SULFAMETHOXAZOLE-TRIMETHOPRIM 800-160 MG PO TABS: 1.0000 | ORAL_TABLET | Freq: Two times a day (BID) | ORAL | 0 refills | Status: AC

## 2018-06-25 NOTE — ED Notes (Signed)
Pt ambulatory to waiting room. Pt verbalized understanding of discharge instructions.   

## 2018-06-25 NOTE — ED Notes (Signed)
Areas noted on both left and right upper thigh. Red and warm to touch

## 2018-06-25 NOTE — ED Triage Notes (Signed)
Patient complaining of abscess to upper right and upper left leg x 3 days. States areas are not draining.

## 2018-06-25 NOTE — Discharge Instructions (Addendum)
As discussed it is possible you are insect bites are red and tender simply from inflammation, but you are being covered for possible infection with antibiotics prescribed.  Take the entire course of your antibiotics.  You may also continue the topical hydrocortisone cream if that helps you with itching.  You may also add Benadryl or Claritin as discussed which can also help with itching.

## 2018-06-26 ENCOUNTER — Telehealth: Payer: Self-pay | Admitting: Obstetrics and Gynecology

## 2018-06-26 ENCOUNTER — Ambulatory Visit (INDEPENDENT_AMBULATORY_CARE_PROVIDER_SITE_OTHER): Payer: Medicaid Other | Admitting: Obstetrics and Gynecology

## 2018-06-26 ENCOUNTER — Encounter: Payer: Self-pay | Admitting: Obstetrics and Gynecology

## 2018-06-26 VITALS — BP 102/65 | HR 82 | Ht 60.0 in | Wt 228.0 lb

## 2018-06-26 DIAGNOSIS — Z3049 Encounter for surveillance of other contraceptives: Secondary | ICD-10-CM | POA: Diagnosis not present

## 2018-06-26 DIAGNOSIS — Z3046 Encounter for surveillance of implantable subdermal contraceptive: Secondary | ICD-10-CM

## 2018-06-26 MED ORDER — NORGESTIMATE-ETH ESTRADIOL 0.25-35 MG-MCG PO TABS: 1.0000 | ORAL_TABLET | Freq: Every day | ORAL | 11 refills | Status: DC

## 2018-06-26 NOTE — Telephone Encounter (Signed)
Left message for pt to call if she is not to keep appt.

## 2018-06-26 NOTE — Progress Notes (Addendum)
Patient ID: Jacqueline Underwood, female   DOB: 12-16-1990, 28 y.o.   MRN: [id number]     GYNECOLOGY OFFICE PROCEDURE NOTE  Jacqueline Underwood is a 28 y.o. [phone] here for Nexplanon removal. Last pap smear was on 06/22/2018 and was normal.  No other gynecologic concerns.  Nexplanon Removal Patient identified, informed consent performed, consent signed.   Appropriate time out taken. Nexplanon site identified.  Area prepped in usual sterile fashon. One ml of 1% lidocaine was used to anesthetize the area at the distal end of the implant. A small stab incision was made right beside the implant on the distal portion.  The Nexplanon rod was grasped using hemostats and removed without difficulty.  There was minimal blood loss. There were no complications. Steri-strips were applied over the small incision.  A pressure bandage was applied to reduce any bruising.  The patient tolerated the procedure well and was given post procedure instructions.  Patient is planning to use BTL for contraception, but would like to use BCPs until then.   Plan: Rx Sprintec Pt taken to scheduler for scheduling of tubal.  By signing my name below, I, De Burrs, attest that this documentation has been prepared under the direction and in the presence of Jonnie Kind, MD. Electronically Signed: De Burrs, Medical Scribe. 06/26/18. 4:34 PM.  I personally performed the services described in this documentation, which was SCRIBED in my presence. The recorded information has been reviewed and considered accurate. It has been edited as necessary during review. Jonnie Kind, MD

## 2018-06-26 NOTE — ED Provider Notes (Signed)
[facility] Provider Note   CSN: [id number] Arrival date & time: 06/25/18  1613     History   Chief Complaint Chief Complaint  Patient presents with  . Abscess    HPI Jacqueline Underwood is a 28 y.o. female with no significant past medical history, woke 3 days ago with a suspected insect bite x3 on her upper thighs.  She has 2 on her left upper thigh and one on her right.  She has had no drainage from these areas, she describes itching with the center of these erythematous patches slightly tender and hard to palpation.  She denies fevers or chills, nausea, abdominal pain, shortness of breath or radiation of pain.  She does endorse having found spiders in their home but does not recall an actual bite.  Significant other at bedside is symptom-free.  She has had no treatments prior to arrival..  The history is provided by the patient.    Past Medical History:  Diagnosis Date  . BV (bacterial vaginosis)   . Menorrhagia   . Pregnant   . Uterine fibroid in pregnancy   . Uterine myoma   . UTI (urinary tract infection)   . Yeast infection     Patient Active Problem List   Diagnosis Date Noted  . S/P myomectomy 04/24/2017  . Dysmenorrhea 03/25/2017  . Menometrorrhagia 03/25/2017  . Moody 03/25/2017  . Nexplanon in place 03/25/2017  . Enlarged uterus 03/25/2017  . Preterm delivery, delivered 01/04/2015  . H/O cesarean section 01/04/2015  . Fibroids 10/21/2012    Past Surgical History:  Procedure Laterality Date  . CESAREAN SECTION MULTI-GESTATIONAL  11/18/2014   Procedure: CESAREAN SECTION MULTI-GESTATIONAL;  Surgeon: Donnamae Jude, MD;  Location: [facility];  Service: Obstetrics;;  . MYOMECTOMY N/A 04/24/2017   Procedure: ABDOMINAL MYOMECTOMY OF UTERUS;  Surgeon: Florian Buff, MD;  Location: [facility];  Service: Gynecology;  Laterality: N/A;  . NO PAST SURGERIES    . REMOVAL OF NON VAGINAL CONTRACEPTIVE DEVICE Right 04/24/2017   Procedure: REMOVAL OF [place];  Surgeon: Florian Buff, MD;  Location: [facility];  Service: Gynecology;  Laterality: Right;     OB History    Gravida  3   Para  2   Term  1   Preterm  1   AB  1   Living  3     SAB  1   TAB      Ectopic      Multiple  1   Live Births  3            Home Medications    Prior to Admission medications   Medication Sig Start Date End Date Taking? Authorizing Provider  Biotin w/ Vitamins C & E (HAIR/SKIN/NAILS PO) Take 3 capsules by mouth daily.    [provider]  etonogestrel (NEXPLANON) 68 MG IMPL implant 1 each by Subdermal route once.    [provider]  sulfamethoxazole-trimethoprim (BACTRIM DS,SEPTRA DS) 800-160 MG tablet Take 1 tablet by mouth 2 (two) times daily for 10 days. 06/25/18 07/05/18  Evalee Jefferson, PA-C    Family History Family History  Problem Relation Age of Onset  . Hypertension Father   . Diabetes Maternal Grandmother     Social History Social History   Tobacco Use  . Smoking status: Never Smoker  . Smokeless tobacco: Never Used  Substance Use Topics  . Alcohol use: Yes    Comment: weekends  . Drug  use: No     Allergies   Peanuts [peanut oil]   Review of Systems Review of Systems  Constitutional: Negative for chills and fever.  Respiratory: Negative for shortness of breath and wheezing.   Skin: Positive for rash.  Neurological: Negative for numbness.     Physical Exam Updated Vital Signs BP (!) 104/57 (BP Location: Right Arm)   Pulse 61   Temp 97.7 F (36.5 C) (Oral)   Resp 18   Ht 5' (1.524 m)   Wt 102.1 kg   SpO2 100%   BMI 43.94 kg/m   Physical Exam Constitutional:      General: She is not in acute distress.    Appearance: She is well-developed.  HENT:     Head: Normocephalic.  Neck:     Musculoskeletal: Neck supple.  Cardiovascular:     Rate and Rhythm: Normal rate.  Pulmonary:     Effort: Pulmonary effort is normal.     Breath sounds: No wheezing.  Musculoskeletal: Normal  range of motion.  Skin:    General: Skin is warm.     Findings: Lesion present. No rash.     Comments: Patient has 3 erythematous round macular lesions on her upper thighs.  There is an identified punctum at the center of these lesions.  No fluctuance, no drainage, no red streaking.  One on her right upper thigh measure 6 cm, 2 on her left upper thigh are nearly coalesced with a total diameter of 12 cm.  Nontender to palpation.      ED Treatments / Results  Labs (all labs ordered are listed, but only abnormal results are displayed) Labs Reviewed - No data to display  EKG None  Radiology No results found.  Procedures Procedures (including critical care time)  Medications Ordered in ED Medications - No data to display   Initial Impression / Assessment and Plan / ED Course  I have reviewed the triage vital signs and the nursing notes.  Pertinent labs & imaging results that were available during my care of the patient were reviewed by me and considered in my medical decision making (see chart for details).     Suspect these lesions may be localized inflammatory reaction from insect bites.  However, could also be early infection/cellulitis from these bites.  She was placed on Bactrim, also discussed home treatments for itch relief.  Advised close follow-up for any worsening or persistent symptoms.  The site edges were marked using a skin marker pen.  PRN follow-up anticipated.  Final Clinical Impressions(s) / ED Diagnoses   Final diagnoses:  Insect bite of thigh, unspecified laterality, initial encounter    ED Discharge Orders         Ordered    sulfamethoxazole-trimethoprim (BACTRIM DS,SEPTRA DS) 800-160 MG tablet  2 times daily     06/25/18 1856           Evalee Jefferson, PA-C 06/26/18 1440    Milton Ferguson, MD 06/28/18 1116

## 2018-07-10 ENCOUNTER — Encounter: Payer: Self-pay | Admitting: Obstetrics and Gynecology

## 2018-07-10 ENCOUNTER — Ambulatory Visit (INDEPENDENT_AMBULATORY_CARE_PROVIDER_SITE_OTHER): Payer: BLUE CROSS/BLUE SHIELD | Admitting: Obstetrics and Gynecology

## 2018-07-10 VITALS — Ht 60.0 in | Wt 225.0 lb

## 2018-07-10 DIAGNOSIS — Z302 Encounter for sterilization: Secondary | ICD-10-CM | POA: Diagnosis not present

## 2018-07-10 DIAGNOSIS — Z01818 Encounter for other preprocedural examination: Secondary | ICD-10-CM | POA: Diagnosis not present

## 2018-07-10 NOTE — Progress Notes (Signed)
Patient ID: Jacqueline Underwood, female   DOB: May 06, 1991, 28 y.o.   MRN: [id number]  Preoperative History and Physical  Jacqueline Underwood is a 28 y.o. [id number] here for surgical management of contraception.   No significant preoperative concerns. She has 27 year old twins and a 27 year old.  Proposed surgery: laparoscopic tubal ligation by salpingectomy  Past Medical History:  Diagnosis Date  . BV (bacterial vaginosis)   . Menorrhagia   . Pregnant   . Uterine fibroid in pregnancy   . Uterine myoma   . UTI (urinary tract infection)   . Yeast infection    Past Surgical History:  Procedure Laterality Date  . CESAREAN SECTION MULTI-GESTATIONAL  11/18/2014   Procedure: CESAREAN SECTION MULTI-GESTATIONAL;  Surgeon: Donnamae Jude, MD;  Location: [facility];  Service: Obstetrics;;  . MYOMECTOMY N/A 04/24/2017   Procedure: ABDOMINAL MYOMECTOMY OF UTERUS;  Surgeon: Florian Buff, MD;  Location: [facility];  Service: Gynecology;  Laterality: N/A;  . NO PAST SURGERIES    . REMOVAL OF NON VAGINAL CONTRACEPTIVE DEVICE Right 04/24/2017   Procedure: REMOVAL OF [place];  Surgeon: Florian Buff, MD;  Location: [facility];  Service: Gynecology;  Laterality: Right;   OB History  Gravida Para Term Preterm AB Living  3 2 1 1 1 3   SAB TAB Ectopic Multiple Live Births  1     1 3     # Outcome Date GA Lbr Len/2nd Weight Sex Delivery Anes PTL Lv  3A Preterm 11/18/14 [redacted]w[redacted]d  3 lb 9.1 oz (1.619 kg) M CS-LTranv Spinal  LIV  3B Preterm 11/18/14 [redacted]w[redacted]d  4 lb 1.6 oz (1.86 kg) M CS-LTranv Spinal  LIV  2 Term 01/15/13 [redacted]w[redacted]d 18:53 / 01:11 7 lb 3.5 oz (3.274 kg) M Vag-Spont EPI  LIV     Birth Comments: within normal limits  1 SAB 2013          Patient denies any other pertinent gynecologic issues.   Current Outpatient Medications on File Prior to Visit  Medication Sig Dispense Refill  . norgestimate-ethinyl estradiol (ORTHO-CYCLEN,SPRINTEC,PREVIFEM) 0.25-35 MG-MCG tablet Take 1 tablet by mouth daily. 1 Package 11  .  Biotin w/ Vitamins C & E (HAIR/SKIN/NAILS PO) Take 3 capsules by mouth daily.    Marland Kitchen etonogestrel (NEXPLANON) 68 MG IMPL implant 1 each by Subdermal route once.     No current facility-administered medications on file prior to visit.    Allergies  Allergen Reactions  . Peanuts [Peanut Oil] Itching    Itching in throat.    Social History:   reports that she has never smoked. She has never used smokeless tobacco. She reports current alcohol use. She reports that she does not use drugs.  Family History  Problem Relation Age of Onset  . Hypertension Father   . Diabetes Maternal Grandmother    Review of Systems: Noncontributory  PHYSICAL EXAM: Height 5' (1.524 m), weight 225 lb (102.1 kg). General appearance - alert, well appearing, and in no distress Chest - clear to auscultation, no wheezes, rales or rhonchi, symmetric air entry Heart - normal rate and regular rhythm Abdomen - soft, nontender, nondistended, no masses or organomegaly Pelvic - DEFERRED Extremities - peripheral pulses normal, no pedal edema, no clubbing or cyanosis  Labs: No results found for this or any previous visit (from the past 336 hour(s)).  Imaging Studies: No results found.  Assessment: Patient Active Problem List   Diagnosis Date Noted  . S/P myomectomy 04/24/2017  .  Dysmenorrhea 03/25/2017  . Menometrorrhagia 03/25/2017  . Moody 03/25/2017  . Nexplanon in place 03/25/2017  . Enlarged uterus 03/25/2017  . Preterm delivery, delivered 01/04/2015  . H/O cesarean section 01/04/2015  . Fibroids 10/21/2012    Plan: Patient will undergo surgical management with laparoscopic tubal ligation by salpingectomy on 08/19/2018 Return to work following Monday [facility])  By signing my name below, I, De Burrs, attest that this documentation has been prepared under the direction and in the presence of Jonnie Kind, MD. Electronically Signed: De Burrs, Medical Scribe. 07/10/18. 4:18 PM.  I personally  performed the services described in this documentation, which was SCRIBED in my presence. The recorded information has been reviewed and considered accurate. It has been edited as necessary during review. Jonnie Kind, MD

## 2018-08-08 NOTE — Patient Instructions (Signed)
Jacqueline Underwood  08/08/2018     @PREFPERIOPPHARMACY @   Your procedure is scheduled on  08/19/2018.  Report to [facility] at  615   A.M.  Call this number if you have problems the morning of surgery:  [phone]   Remember:  Do not eat or drink after midnight.                         Take these medicines the morning of surgery with A SIP OF WATER None    Do not wear jewelry, make-up or nail polish.  Do not wear lotions, powders, or perfumes, or deodorant.  Do not shave 48 hours prior to surgery.  Men may shave face and neck.  Do not bring valuables to the hospital.  [facility] is not responsible for any belongings or valuables.  Contacts, dentures or bridgework may not be worn into surgery.  Leave your suitcase in the car.  After surgery it may be brought to your room.  For patients admitted to the hospital, discharge time will be determined by your treatment team.  Patients discharged the day of surgery will not be allowed to drive home.   Name and phone number of your driver:   Family Special instructions:  None  Please read over the following fact sheets that you were given. Anesthesia Post-op Instructions and Care and Recovery After Surgery       Laparoscopic Tubal Ligation, Care After Refer to this sheet in the next few weeks. These instructions provide you with information about caring for yourself after your procedure. Your health care provider may also give you more specific instructions. Your treatment has been planned according to current medical practices, but problems sometimes occur. Call your health care provider if you have any problems or questions after your procedure. What can I expect after the procedure? After the procedure, it is common to have:  A sore throat.  Discomfort in your shoulder.  Mild discomfort or cramping in your abdomen.  Gas pains.  Pain or soreness in the area where the surgical cut (incision) was made.  A bloated  feeling.  Tiredness.  Nausea.  Vomiting. Follow these instructions at home: Medicines  Take over-the-counter and prescription medicines only as told by your health care provider.  Do not take aspirin because it can cause bleeding.  Do not drive or operate heavy machinery while taking prescription pain medicine. Activity  Rest for the rest of the day.  Return to your normal activities as told by your health care provider. Ask your health care provider what activities are safe for you. Incision care      Follow instructions from your health care provider about how to take care of your incision. Make sure you: ? Wash your hands with soap and water before you change your bandage (dressing). If soap and water are not available, use hand sanitizer. ? Change your dressing as told by your health care provider. ? Leave stitches (sutures) in place. They may need to stay in place for 2 weeks or longer.  Check your incision area every day for signs of infection. Check for: ? More redness, swelling, or pain. ? More fluid or blood. ? Warmth. ? Pus or a bad smell. Other Instructions  Do not take baths, swim, or use a hot tub until your health care provider approves. You may take showers.  Keep all follow-up visits as told by your  health care provider. This is important.  Have someone help you with your daily household tasks for the first few days. Contact a health care provider if:  You have more redness, swelling, or pain around your incision.  Your incision feels warm to the touch.  You have pus or a bad smell coming from your incision.  The edges of your incision break open after the sutures have been removed.  Your pain does not improve after 2-3 days.  You have a rash.  You repeatedly become dizzy or light-headed.  Your pain medicine is not helping.  You are constipated. Get help right away if:  You have a fever.  You faint.  You have increasing pain in your  abdomen.  You have severe pain in one or both of your shoulders.  You have fluid or blood coming from your sutures or from your vagina.  You have shortness of breath or difficulty breathing.  You have chest pain or leg pain.  You have ongoing nausea, vomiting, or diarrhea. This information is not intended to replace advice given to you by your health care provider. Make sure you discuss any questions you have with your health care provider. Document Released: 12/01/2004 Document Revised: 01/08/2017 Document Reviewed: 04/24/2015 Elsevier Interactive Patient Education  2019 [place] Anesthesia, Adult, Care After This sheet gives you information about how to care for yourself after your procedure. Your health care provider may also give you more specific instructions. If you have problems or questions, contact your health care provider. What can I expect after the procedure? After the procedure, the following side effects are common:  Pain or discomfort at the IV site.  Nausea.  Vomiting.  Sore throat.  Trouble concentrating.  Feeling cold or chills.  Weak or tired.  Sleepiness and fatigue.  Soreness and body aches. These side effects can affect parts of the body that were not involved in surgery. Follow these instructions at home:  For at least 24 hours after the procedure:  Have a responsible adult stay with you. It is important to have someone help care for you until you are awake and alert.  Rest as needed.  Do not: ? Participate in activities in which you could fall or become injured. ? Drive. ? Use heavy machinery. ? Drink alcohol. ? Take sleeping pills or medicines that cause drowsiness. ? Make important decisions or sign legal documents. ? Take care of children on your own. Eating and drinking  Follow any instructions from your health care provider about eating or drinking restrictions.  When you feel hungry, start by eating small amounts of  foods that are soft and easy to digest (bland), such as toast. Gradually return to your regular diet.  Drink enough fluid to keep your urine pale yellow.  If you vomit, rehydrate by drinking water, juice, or clear broth. General instructions  If you have sleep apnea, surgery and certain medicines can increase your risk for breathing problems. Follow instructions from your health care provider about wearing your sleep device: ? Anytime you are sleeping, including during daytime naps. ? While taking prescription pain medicines, sleeping medicines, or medicines that make you drowsy.  Return to your normal activities as told by your health care provider. Ask your health care provider what activities are safe for you.  Take over-the-counter and prescription medicines only as told by your health care provider.  If you smoke, do not smoke without supervision.  Keep all follow-up visits as told by  your health care provider. This is important. Contact a health care provider if:  You have nausea or vomiting that does not get better with medicine.  You cannot eat or drink without vomiting.  You have pain that does not get better with medicine.  You are unable to pass urine.  You develop a skin rash.  You have a fever.  You have redness around your IV site that gets worse. Get help right away if:  You have difficulty breathing.  You have chest pain.  You have blood in your urine or stool, or you vomit blood. Summary  After the procedure, it is common to have a sore throat or nausea. It is also common to feel tired.  Have a responsible adult stay with you for the first 24 hours after general anesthesia. It is important to have someone help care for you until you are awake and alert.  When you feel hungry, start by eating small amounts of foods that are soft and easy to digest (bland), such as toast. Gradually return to your regular diet.  Drink enough fluid to keep your urine pale  yellow.  Return to your normal activities as told by your health care provider. Ask your health care provider what activities are safe for you. This information is not intended to replace advice given to you by your health care provider. Make sure you discuss any questions you have with your health care provider. Document Released: 08/20/2000 Document Revised: 12/28/2016 Document Reviewed: 12/28/2016 Elsevier Interactive Patient Education  2019 [employer].

## 2018-08-13 ENCOUNTER — Encounter (HOSPITAL_COMMUNITY)
Admission: RE | Admit: 2018-08-13 | Discharge: 2018-08-13 | Disposition: A | Discharge status: Home / Self Care | Payer: BLUE CROSS/BLUE SHIELD | Source: Ambulatory Visit | Attending: Obstetrics and Gynecology | Admitting: Obstetrics and Gynecology

## 2018-08-13 ENCOUNTER — Encounter (HOSPITAL_COMMUNITY): Payer: Self-pay

## 2018-08-19 ENCOUNTER — Encounter (HOSPITAL_COMMUNITY): Admission: RE | Payer: Self-pay | Source: Home / Self Care

## 2018-08-19 ENCOUNTER — Ambulatory Visit (HOSPITAL_COMMUNITY)
Admission: RE | Admit: 2018-08-19 | Payer: BLUE CROSS/BLUE SHIELD | Source: Home / Self Care | Admitting: Obstetrics and Gynecology

## 2018-08-19 SURGERY — LIGATION, FALLOPIAN TUBE, LAPAROSCOPIC
Anesthesia: General | Laterality: Bilateral

## 2018-08-27 ENCOUNTER — Encounter: Payer: Self-pay | Admitting: Obstetrics and Gynecology

## 2018-10-21 ENCOUNTER — Encounter: Payer: Self-pay | Admitting: *Deleted

## 2018-10-22 ENCOUNTER — Encounter: Payer: BLUE CROSS/BLUE SHIELD | Admitting: Obstetrics and Gynecology

## 2018-10-24 ENCOUNTER — Encounter: Payer: BLUE CROSS/BLUE SHIELD | Admitting: Obstetrics and Gynecology

## 2018-10-24 ENCOUNTER — Ambulatory Visit (INDEPENDENT_AMBULATORY_CARE_PROVIDER_SITE_OTHER): Payer: BLUE CROSS/BLUE SHIELD | Admitting: Obstetrics and Gynecology

## 2018-10-24 ENCOUNTER — Encounter: Payer: Self-pay | Admitting: Obstetrics and Gynecology

## 2018-10-24 ENCOUNTER — Other Ambulatory Visit: Payer: Self-pay

## 2018-10-24 VITALS — BP 102/65 | HR 74 | Ht 60.0 in | Wt 209.8 lb

## 2018-10-24 DIAGNOSIS — Z01812 Encounter for preprocedural laboratory examination: Secondary | ICD-10-CM | POA: Diagnosis not present

## 2018-10-24 DIAGNOSIS — D219 Benign neoplasm of connective and other soft tissue, unspecified: Secondary | ICD-10-CM | POA: Diagnosis not present

## 2018-10-24 NOTE — Progress Notes (Signed)
Patient ID: [name], female   DOB: 1990-12-05, 28 y.o.   MRN: [id number]  Preoperative History and Physical  Jacqueline Underwood is a 28 y.o. [id number] here for surgical management of contraception management. No significant preoperative concerns. Just had recent period 5/23-28/2020 was regular.  Proposed surgery: Laparoscopic Bilateral Salpingectomy  Past Medical History:  Diagnosis Date  . BV (bacterial vaginosis)   . Menorrhagia   . Pregnant   . Uterine fibroid in pregnancy   . Uterine myoma   . UTI (urinary tract infection)   . Yeast infection    Past Surgical History:  Procedure Laterality Date  . CESAREAN SECTION MULTI-GESTATIONAL  11/18/2014   Procedure: CESAREAN SECTION MULTI-GESTATIONAL;  Surgeon: Donnamae Jude, MD;  Location: [facility];  Service: Obstetrics;;  . MYOMECTOMY N/A 04/24/2017   Procedure: ABDOMINAL MYOMECTOMY OF UTERUS;  Surgeon: Florian Buff, MD;  Location: [facility];  Service: Gynecology;  Laterality: N/A;  . NO PAST SURGERIES    . REMOVAL OF NON VAGINAL CONTRACEPTIVE DEVICE Right 04/24/2017   Procedure: REMOVAL OF [place];  Surgeon: Florian Buff, MD;  Location: [facility];  Service: Gynecology;  Laterality: Right;   OB History  Gravida Para Term Preterm AB Living  3 2 1 1 1 3   SAB TAB Ectopic Multiple Live Births  1     1 3     # Outcome Date GA Lbr Len/2nd Weight Sex Delivery Anes PTL Lv  3A Preterm 11/18/14 [redacted]w[redacted]d  3 lb 9.1 oz (1.619 kg) M CS-LTranv Spinal  LIV  3B Preterm 11/18/14 [redacted]w[redacted]d  4 lb 1.6 oz (1.86 kg) M CS-LTranv Spinal  LIV  2 Term 01/15/13 [redacted]w[redacted]d 18:53 / 01:11 7 lb 3.5 oz (3.274 kg) M Vag-Spont EPI  LIV     Birth Comments: within normal limits  1 SAB 2013          Patient denies any other pertinent gynecologic issues.   Current Outpatient Medications on File Prior to Visit  Medication Sig Dispense Refill  . norgestimate-ethinyl estradiol (ORTHO-CYCLEN,SPRINTEC,PREVIFEM) 0.25-35 MG-MCG tablet Take 1 tablet by mouth daily. 1 Package 11    No current facility-administered medications on file prior to visit.    Allergies  Allergen Reactions  . Peanuts [Peanut Oil] Itching    Itching in throat.    Social History:   reports that she has never smoked. She has never used smokeless tobacco. She reports current alcohol use. She reports that she does not use drugs.  Family History  Problem Relation Age of Onset  . Hypertension Father   . Diabetes Maternal Grandmother     Review of Systems: Noncontributory  PHYSICAL EXAM: Blood pressure 102/65, pulse 74, height 5' (1.524 m), weight 209 lb 12.8 oz (95.2 kg), last menstrual period 10/18/2018. General appearance - alert, well appearing, and in no distress Chest - clear to auscultation, no wheezes, rales or rhonchi, symmetric air entry Heart - normal rate and regular rhythm Abdomen - soft, nontender, nondistended, no masses or organomegaly Pelvic -   VAGINA: normal appearing vagina with normal color and discharge, no lesions, light pink discharge consistent with completion of period. Well healed c-section scar CERVIX: normal appearing cervix without discharge or lesions,  UTERUS: uterus is small size well supported. Bladder nml GCCHL collected Extremities - peripheral pulses normal, no pedal edema, no clubbing or cyanosis  Labs: No results found for this or any previous visit (from the past 336 hour(s)).  Imaging Studies: No results found.  Assessment:  Patient Active Problem List   Diagnosis Date Noted  . S/P myomectomy 04/24/2017  . Dysmenorrhea 03/25/2017  . Menometrorrhagia 03/25/2017  . [place] 03/25/2017  . Nexplanon in place 03/25/2017  . Enlarged uterus 03/25/2017  . Preterm delivery, delivered 01/04/2015  . H/O cesarean section 01/04/2015  . Fibroids 10/21/2012    Plan: Patient will undergo surgical management with Laparoscopic Bilateral Salpingectomy  2 weeks post-op after surgery   By signing my name below, I, Samul Dada, attest that this  documentation has been prepared under the direction and in the presence of Jonnie Kind, MD. Electronically Signed: [place]. 10/24/18. 12:38 PM.  I personally performed the services described in this documentation, which was SCRIBED in my presence. The recorded information has been reviewed and considered accurate. It has been edited as necessary during review. Jonnie Kind, MD

## 2018-10-24 NOTE — Assessment & Plan Note (Signed)
S/p myomectomy 02/2017

## 2018-10-27 NOTE — Patient Instructions (Addendum)
Jacqueline Underwood  10/27/2018     @PREFPERIOPPHARMACY @   Your procedure is scheduled on  11/04/2018 .  Report to [facility] at  615  A.M.  Call this number if you have problems the morning of surgery:  [phone]   Remember:  Do not eat or drink after midnight.                         Take these medicines the morning of surgery with A SIP OF WATER  None    Do not wear jewelry, make-up or nail polish.  Do not wear lotions, powders, or perfumes, or deodorant.  Do not shave 48 hours prior to surgery.  Men may shave face and neck.  Do not bring valuables to the hospital.  [facility] is not responsible for any belongings or valuables.  Contacts, dentures or bridgework may not be worn into surgery.  Leave your suitcase in the car.  After surgery it may be brought to your room.  For patients admitted to the hospital, discharge time will be determined by your treatment team.  Patients discharged the day of surgery will not be allowed to drive home.   Name and phone number of your driver:   family Special instructions:  None  Please read over the following fact sheets that you were given. Anesthesia Post-op Instructions and Care and Recovery After Surgery       Salpingectomy, Care After This sheet gives you information about how to care for yourself after your procedure. Your health care provider may also give you more specific instructions. If you have problems or questions, contact your health care provider. What can I expect after the procedure? After your procedure, it is common to have:  Pain in your abdomen.  Some occasional vaginal bleeding (spotting).  Tiredness. Follow these instructions at home: Incision care   Keep your incision area and your bandage (dressing) clean and dry.  Follow instructions from your health care provider about how to take care of your incision. Make sure you: ? Wash your hands with soap and water before you change your  dressing. If soap and water are not available, use hand sanitizer. ? Change your dressing astold by your health care provider. ? Leave stitches (sutures), staples, skin glue, or adhesive strips in place. These skin closures may need to stay in place for 2 weeks or longer. If adhesive strip edges start to loosen and curl up, you may trim the loose edges. Do not remove adhesive strips completely unless your health care provider tells you to do that.  Check your incision area every day for signs of infection. Check for: ? More redness, swelling, or pain. ? More fluid or blood. ? Warmth. ? Pus or a bad smell. Activity   Do not drive or use heavy machinery while taking prescription pain medicine.  Do not drive for 24 hours if you received a medicine to help you relax (sedative).  Rest as directed by your health care provider. Ask your health care provider what activities are safe for you. You should avoid: ? Lifting anything that is heavier than 10 lb (4.5 kg) until your health care provider approves. ? Activities that require a lot of energy.  Until your health care provider approves: ? Do not douche. ? Do not use tampons. ? Do not have sexual intercourse. General instructions  Take over-the-counter and prescription medicines only as told  by your health care provider.  To prevent or treat constipation while you are taking prescription pain medicine, your health care provider may recommend that you: ? Drink enough fluid to keep your urine clear or pale yellow. ? Take over-the-counter or prescription medicines. ? Eat foods that are high in fiber, such as fresh fruits and vegetables, whole grains, and beans. ? Limit foods that are high in fat and processed sugars, such as fried and sweet foods.  Do not take baths, swim, or use a hot tub until your health care provider approves. You may take showers.  Wear compression stockings as told by your health care provider. These stockings help  to prevent blood clots and reduce swelling in your legs.  Keep all follow-up visits as told by your health care provider. This is important. Contact a health care provider if:  You have: ? Pain when you urinate. ? More redness, swelling, or pain around your incision. ? More fluid or blood coming from your incision. ? Pus or a bad smell coming from your incision. ? A fever. ? Abdominal pain that gets worse or does not get better with medicine.  Your incision feels warm to the touch.  Your incision starts to break open.  You develop a rash.  You develop nausea and vomiting.  You feel light-headed. Get help right away if:  You develop pain in your chest or leg.  You develop shortness of breath.  You faint.  You have increased vaginal bleeding. This information is not intended to replace advice given to you by your health care provider. Make sure you discuss any questions you have with your health care provider. Document Released: 08/18/2010 Document Revised: 01/11/2016 Document Reviewed: 01/12/2016 Elsevier Interactive Patient Education  2019 [place] Anesthesia, Adult, Care After This sheet gives you information about how to care for yourself after your procedure. Your health care provider may also give you more specific instructions. If you have problems or questions, contact your health care provider. What can I expect after the procedure? After the procedure, the following side effects are common:  Pain or discomfort at the IV site.  Nausea.  Vomiting.  Sore throat.  Trouble concentrating.  Feeling cold or chills.  Weak or tired.  Sleepiness and fatigue.  Soreness and body aches. These side effects can affect parts of the body that were not involved in surgery. Follow these instructions at home:  For at least 24 hours after the procedure:  Have a responsible adult stay with you. It is important to have someone help care for you until you are  awake and alert.  Rest as needed.  Do not: ? Participate in activities in which you could fall or become injured. ? Drive. ? Use heavy machinery. ? Drink alcohol. ? Take sleeping pills or medicines that cause drowsiness. ? Make important decisions or sign legal documents. ? Take care of children on your own. Eating and drinking  Follow any instructions from your health care provider about eating or drinking restrictions.  When you feel hungry, start by eating small amounts of foods that are soft and easy to digest (bland), such as toast. Gradually return to your regular diet.  Drink enough fluid to keep your urine pale yellow.  If you vomit, rehydrate by drinking water, juice, or clear broth. General instructions  If you have sleep apnea, surgery and certain medicines can increase your risk for breathing problems. Follow instructions from your health care provider about wearing your  sleep device: ? Anytime you are sleeping, including during daytime naps. ? While taking prescription pain medicines, sleeping medicines, or medicines that make you drowsy.  Return to your normal activities as told by your health care provider. Ask your health care provider what activities are safe for you.  Take over-the-counter and prescription medicines only as told by your health care provider.  If you smoke, do not smoke without supervision.  Keep all follow-up visits as told by your health care provider. This is important. Contact a health care provider if:  You have nausea or vomiting that does not get better with medicine.  You cannot eat or drink without vomiting.  You have pain that does not get better with medicine.  You are unable to pass urine.  You develop a skin rash.  You have a fever.  You have redness around your IV site that gets worse. Get help right away if:  You have difficulty breathing.  You have chest pain.  You have blood in your urine or stool, or you vomit  blood. Summary  After the procedure, it is common to have a sore throat or nausea. It is also common to feel tired.  Have a responsible adult stay with you for the first 24 hours after general anesthesia. It is important to have someone help care for you until you are awake and alert.  When you feel hungry, start by eating small amounts of foods that are soft and easy to digest (bland), such as toast. Gradually return to your regular diet.  Drink enough fluid to keep your urine pale yellow.  Return to your normal activities as told by your health care provider. Ask your health care provider what activities are safe for you. This information is not intended to replace advice given to you by your health care provider. Make sure you discuss any questions you have with your health care provider. Document Released: 08/20/2000 Document Revised: 12/28/2016 Document Reviewed: 12/28/2016 Elsevier Interactive Patient Education  2019 [place].    How to Use Chlorhexidine Before Surgery Chlorhexidine gluconate (CHG) is a germ-killing (antiseptic) solution that is used to clean the skin. It gets rid of the bacteria that normally live on the skin. Cleaning your skin with CHG before surgery helps lower the risk for infection after surgery. To clean your skin before surgery, you may be given:  A CHG solution to use in the shower.  A prepackaged cloth that contains CHG. What are the risks? Risks of using CHG include:  A skin reaction.  Hearing loss, if CHG gets in your ears.  Eye injury, if CHG gets in your eyes and is not rinsed out.  The CHG product catching fire. Make sure that you avoid smoking and flames after applying CHG to your skin. Do not use CHG:  If you have a chlorhexidine allergy or have previously reacted to chlorhexidine.  On babies younger than 68 months of age. How to use CHG solution   Use CHG only as told by your health care provider, and follow the instructions on  the label.  Use CHG solution while taking a shower. Follow these steps when using CHG solution (unless your health care provider gives you different instructions): 1. Start the shower. 2. Use your normal soap and shampoo to wash your face and hair. 3. Turn off the shower or move out of the shower stream. 4. Pour the CHG onto a clean washcloth. Do not use any type of brush or rough-edged  sponge. 5. Starting at your neck, lather your body down to your toes. Make sure you:  Pay special attention to the part of your body where you will be having surgery. Scrub this area for at least 1 minute.  Use the full amount of CHG as directed. Usually, this is one bottle.  Do not use CHG on your head or face. If the solution gets into your ears or eyes, rinse them well with water.  Avoid your genital area.  Avoid any areas of skin that have broken skin, cuts, or scrapes.  Scrub your back and under your arms. Make sure to wash skin folds. 6. Let the lather sit on your skin for 1-2 minutes or as long as told by your health care provider. 7. Thoroughly rinse your entire body in the shower. Make sure that all body creases and crevices are rinsed well. 8. Dry off with a clean towel. Do not put any substances on your body afterward, such as powder, lotion, or perfume. 9. Put on clean clothes or pajamas. 10. If it is the night before your surgery, sleep in clean sheets. How to use CHG prepackaged cloths   Only use CHG cloths as told by your health care provider, and follow the instructions on the label.  Use the CHG cloth on clean, dry skin. Follow these steps when using a CHG cloth (unless your health care provider gives you different instructions): 1. Using the CHG cloth, vigorously scrub the part of your body where you will be having surgery. Scrub using a back-and-forth motion for 3 minutes. The area on your body should be completely wet with CHG when you are done scrubbing. 2. Do not rinse. Discard  the cloth and let the area air-dry for 1 minute. Do not put any substances on your body afterward, such as powder, lotion, or perfume. 3. Put on clean clothes or pajamas. 4. If it is the night before your surgery, sleep in clean sheets. Contact a health care provider if:  Your skin gets irritated after scrubbing.  You have questions about using your solution or cloth. Get help right away if:  Your eyes become very red or swollen.  Your eyes itch badly.  Your skin itches badly and is red or swollen.  Your hearing changes.  You have trouble seeing.  You have swelling or tingling in your mouth or throat.  You have trouble breathing.  You swallow any chlorhexidine. Summary  Chlorhexidine gluconate (CHG) is a germ-killing (antiseptic) solution that is used to clean the skin. Cleaning your skin with CHG before surgery helps lower the risk for infection after surgery.  You may be given CHG to use at home. It may be in a bottle or in a prepackaged cloth to use on your skin. Carefully follow your health care provider's instructions and the instructions on the product label.  Do not use CHG if you have a chlorhexidine allergy.  Contact your health care provider if your skin gets irritated after scrubbing. This information is not intended to replace advice given to you by your health care provider. Make sure you discuss any questions you have with your health care provider. Document Released: 02/06/2012 Document Revised: 04/11/2017 Document Reviewed: 04/11/2017 Elsevier Interactive Patient Education  2019 [employer].

## 2018-10-29 ENCOUNTER — Encounter (HOSPITAL_COMMUNITY)
Admission: RE | Admit: 2018-10-29 | Discharge: 2018-10-29 | Disposition: A | Discharge status: Home / Self Care | Payer: BC Managed Care – PPO | Source: Ambulatory Visit | Attending: Obstetrics and Gynecology | Admitting: Obstetrics and Gynecology

## 2018-10-31 ENCOUNTER — Other Ambulatory Visit (HOSPITAL_COMMUNITY)
Admission: RE | Admit: 2018-10-31 | Discharge: 2018-10-31 | Disposition: A | Discharge status: Home / Self Care | Payer: BC Managed Care – PPO | Source: Ambulatory Visit | Attending: Obstetrics and Gynecology | Admitting: Obstetrics and Gynecology

## 2018-10-31 ENCOUNTER — Encounter (HOSPITAL_COMMUNITY)
Admission: RE | Admit: 2018-10-31 | Discharge: 2018-10-31 | Disposition: A | Discharge status: Home / Self Care | Payer: BC Managed Care – PPO | Source: Ambulatory Visit | Attending: Obstetrics and Gynecology | Admitting: Obstetrics and Gynecology

## 2018-10-31 ENCOUNTER — Other Ambulatory Visit: Payer: Self-pay

## 2018-10-31 ENCOUNTER — Encounter (HOSPITAL_COMMUNITY): Payer: Self-pay

## 2018-10-31 DIAGNOSIS — Z01812 Encounter for preprocedural laboratory examination: Secondary | ICD-10-CM | POA: Diagnosis not present

## 2018-10-31 DIAGNOSIS — Z1159 Encounter for screening for other viral diseases: Secondary | ICD-10-CM | POA: Insufficient documentation

## 2018-10-31 LAB — CBC WITH DIFFERENTIAL/PLATELET
Abs Immature Granulocytes: 0.01 10*3/uL (ref 0.00–0.07)
Basophils Absolute: 0 10*3/uL (ref 0.0–0.1)
Basophils Relative: 0 %
Eosinophils Absolute: 0.4 10*3/uL (ref 0.0–0.5)
Eosinophils Relative: 5 %
HCT: 36.8 % (ref 36.0–46.0)
Hemoglobin: 12.4 g/dL (ref 12.0–15.0)
Immature Granulocytes: 0 %
Lymphocytes Relative: 31 %
Lymphs Abs: 2.2 10*3/uL (ref 0.7–4.0)
MCH: 32 pg (ref 26.0–34.0)
MCHC: 33.7 g/dL (ref 30.0–36.0)
MCV: 94.8 fL (ref 80.0–100.0)
Monocytes Absolute: 0.5 10*3/uL (ref 0.1–1.0)
Monocytes Relative: 7 %
Neutro Abs: 4 10*3/uL (ref 1.7–7.7)
Neutrophils Relative %: 57 %
Platelets: 268 10*3/uL (ref 150–400)
RBC: 3.88 MIL/uL (ref 3.87–5.11)
RDW: 11.2 % — ABNORMAL LOW (ref 11.5–15.5)
WBC: 7 10*3/uL (ref 4.0–10.5)
nRBC: 0 % (ref 0.0–0.2)

## 2018-10-31 LAB — URINALYSIS, ROUTINE W REFLEX MICROSCOPIC
Bilirubin Urine: NEGATIVE
Glucose, UA: NEGATIVE mg/dL
Hgb urine dipstick: NEGATIVE
Ketones, ur: NEGATIVE mg/dL
Leukocytes,Ua: NEGATIVE
Nitrite: NEGATIVE
Protein, ur: NEGATIVE mg/dL
Specific Gravity, Urine: 1.019 (ref 1.005–1.030)
pH: 7 (ref 5.0–8.0)

## 2018-10-31 LAB — HCG, SERUM, QUALITATIVE: Preg, Serum: NEGATIVE

## 2018-10-31 NOTE — Pre-Procedure Instructions (Signed)
Spoke with Dr Glo Herring, he does not want a type and cross drawn.

## 2018-11-01 LAB — NOVEL CORONAVIRUS, NAA (HOSP ORDER, SEND-OUT TO REF LAB; TAT 18-24 HRS): SARS-CoV-2, NAA: NOT DETECTED

## 2018-11-01 LAB — GC/CHLAMYDIA PROBE AMP
Chlamydia trachomatis, NAA: NEGATIVE
Neisseria Gonorrhoeae by PCR: NEGATIVE

## 2018-11-04 ENCOUNTER — Encounter (HOSPITAL_COMMUNITY): Payer: Self-pay | Admitting: Emergency Medicine

## 2018-11-04 ENCOUNTER — Ambulatory Visit (HOSPITAL_COMMUNITY)
Admission: RE | Admit: 2018-11-04 | Discharge: 2018-11-04 | Disposition: A | Discharge status: Home / Self Care | Payer: BC Managed Care – PPO | Attending: Obstetrics and Gynecology | Admitting: Obstetrics and Gynecology

## 2018-11-04 ENCOUNTER — Ambulatory Visit (HOSPITAL_COMMUNITY): Payer: BC Managed Care – PPO | Admitting: Anesthesiology

## 2018-11-04 ENCOUNTER — Encounter (HOSPITAL_COMMUNITY)
Admission: RE | Disposition: A | Discharge status: Home / Self Care | Payer: Self-pay | Source: Home / Self Care | Attending: Obstetrics and Gynecology

## 2018-11-04 ENCOUNTER — Other Ambulatory Visit: Payer: Self-pay

## 2018-11-04 DIAGNOSIS — N856 Intrauterine synechiae: Secondary | ICD-10-CM | POA: Insufficient documentation

## 2018-11-04 DIAGNOSIS — N946 Dysmenorrhea, unspecified: Secondary | ICD-10-CM | POA: Diagnosis not present

## 2018-11-04 DIAGNOSIS — N736 Female pelvic peritoneal adhesions (postinfective): Secondary | ICD-10-CM | POA: Diagnosis not present

## 2018-11-04 DIAGNOSIS — Z8249 Family history of ischemic heart disease and other diseases of the circulatory system: Secondary | ICD-10-CM | POA: Insufficient documentation

## 2018-11-04 DIAGNOSIS — Z833 Family history of diabetes mellitus: Secondary | ICD-10-CM | POA: Insufficient documentation

## 2018-11-04 DIAGNOSIS — Z91010 Allergy to peanuts: Secondary | ICD-10-CM | POA: Diagnosis not present

## 2018-11-04 DIAGNOSIS — Z302 Encounter for sterilization: Secondary | ICD-10-CM | POA: Diagnosis not present

## 2018-11-04 DIAGNOSIS — N921 Excessive and frequent menstruation with irregular cycle: Secondary | ICD-10-CM | POA: Diagnosis not present

## 2018-11-04 HISTORY — PX: LAPAROSCOPIC BILATERAL SALPINGECTOMY: SHX5889

## 2018-11-04 HISTORY — PX: LAPAROSCOPIC LYSIS OF ADHESIONS: SHX5905

## 2018-11-04 SURGERY — SALPINGECTOMY, BILATERAL, LAPAROSCOPIC
Anesthesia: General

## 2018-11-04 MED ORDER — BUPIVACAINE HCL (PF) 0.25 % IJ SOLN
INTRAMUSCULAR | Status: AC
  Filled 2018-11-04: qty 30

## 2018-11-04 MED ORDER — ARTIFICIAL TEARS OPHTHALMIC OINT
TOPICAL_OINTMENT | OPHTHALMIC | Status: AC
  Filled 2018-11-04: qty 7

## 2018-11-04 MED ORDER — ONDANSETRON HCL 4 MG/2ML IJ SOLN
INTRAMUSCULAR | Status: AC
  Filled 2018-11-04: qty 4

## 2018-11-04 MED ORDER — SUCCINYLCHOLINE 20MG/ML (10ML) SYRINGE FOR MEDFUSION PUMP - OPTIME
INTRAMUSCULAR | Status: DC | PRN
  Administered 2018-11-04: 120 mg via INTRAVENOUS

## 2018-11-04 MED ORDER — ONDANSETRON HCL 4 MG/2ML IJ SOLN
INTRAMUSCULAR | Status: DC | PRN
  Administered 2018-11-04: 4 mg via INTRAVENOUS

## 2018-11-04 MED ORDER — ROCURONIUM BROMIDE 10 MG/ML (PF) SYRINGE
PREFILLED_SYRINGE | INTRAVENOUS | Status: AC
  Filled 2018-11-04: qty 10

## 2018-11-04 MED ORDER — LACTATED RINGERS IV SOLN
INTRAVENOUS | Status: DC
  Administered 2018-11-04: 07:00:00 via INTRAVENOUS

## 2018-11-04 MED ORDER — HYDROCODONE-ACETAMINOPHEN 7.5-325 MG PO TABS
1.0000 | ORAL_TABLET | Freq: Once | ORAL | Status: AC | PRN
  Administered 2018-11-04: 1 via ORAL
  Filled 2018-11-04: qty 1

## 2018-11-04 MED ORDER — FENTANYL CITRATE (PF) 250 MCG/5ML IJ SOLN
INTRAMUSCULAR | Status: AC
  Filled 2018-11-04: qty 5

## 2018-11-04 MED ORDER — PROMETHAZINE HCL 25 MG/ML IJ SOLN: 6.2500 mg | INTRAMUSCULAR | Status: DC | PRN

## 2018-11-04 MED ORDER — PROPOFOL 10 MG/ML IV BOLUS
INTRAVENOUS | Status: AC
  Filled 2018-11-04: qty 40

## 2018-11-04 MED ORDER — SUGAMMADEX SODIUM 200 MG/2ML IV SOLN
INTRAVENOUS | Status: DC | PRN
  Administered 2018-11-04: 200 mg via INTRAVENOUS

## 2018-11-04 MED ORDER — SUCCINYLCHOLINE CHLORIDE 200 MG/10ML IV SOSY
PREFILLED_SYRINGE | INTRAVENOUS | Status: AC
  Filled 2018-11-04: qty 10

## 2018-11-04 MED ORDER — 0.9 % SODIUM CHLORIDE (POUR BTL) OPTIME
TOPICAL | Status: DC | PRN
  Administered 2018-11-04: 1000 mL

## 2018-11-04 MED ORDER — FENTANYL CITRATE (PF) 100 MCG/2ML IJ SOLN
INTRAMUSCULAR | Status: DC | PRN
  Administered 2018-11-04 (×3): 50 ug via INTRAVENOUS

## 2018-11-04 MED ORDER — LIDOCAINE 2% (20 MG/ML) 5 ML SYRINGE
INTRAMUSCULAR | Status: AC
  Filled 2018-11-04: qty 5

## 2018-11-04 MED ORDER — MIDAZOLAM HCL 2 MG/2ML IJ SOLN: 0.5000 mg | Freq: Once | INTRAMUSCULAR | Status: DC | PRN

## 2018-11-04 MED ORDER — PROPOFOL 10 MG/ML IV BOLUS
INTRAVENOUS | Status: DC | PRN
  Administered 2018-11-04: 150 mg via INTRAVENOUS

## 2018-11-04 MED ORDER — ROCURONIUM 10MG/ML (10ML) SYRINGE FOR MEDFUSION PUMP - OPTIME
INTRAVENOUS | Status: DC | PRN
  Administered 2018-11-04: 10 mg via INTRAVENOUS
  Administered 2018-11-04: 20 mg via INTRAVENOUS

## 2018-11-04 MED ORDER — HYDROCODONE-ACETAMINOPHEN 5-325 MG PO TABS: 1.0000 | ORAL_TABLET | Freq: Four times a day (QID) | ORAL | 0 refills | Status: DC | PRN

## 2018-11-04 MED ORDER — BUPIVACAINE HCL (PF) 0.25 % IJ SOLN
INTRAMUSCULAR | Status: DC | PRN
  Administered 2018-11-04: 5 mL

## 2018-11-04 MED ORDER — EPHEDRINE 5 MG/ML INJ
INTRAVENOUS | Status: AC
  Filled 2018-11-04: qty 10

## 2018-11-04 MED ORDER — HYDROMORPHONE HCL 1 MG/ML IJ SOLN: 0.2500 mg | INTRAMUSCULAR | Status: DC | PRN

## 2018-11-04 SURGICAL SUPPLY — 38 items
APPLICATOR COTTON TIP 6 STRL (MISCELLANEOUS) ×2 IMPLANT
APPLICATOR COTTON TIP 6IN STRL (MISCELLANEOUS) ×4
BANDAGE STRIP 1X3 FLEXIBLE (GAUZE/BANDAGES/DRESSINGS) ×12 IMPLANT
BLADE SURG SZ11 CARB STEEL (BLADE) ×4 IMPLANT
CLOSURE STERI-STRIP 1/4X4 (GAUZE/BANDAGES/DRESSINGS) ×4 IMPLANT
CLOSURE WOUND 1/4 X3 (GAUZE/BANDAGES/DRESSINGS) ×1
CLOTH BEACON ORANGE TIMEOUT ST (SAFETY) ×4 IMPLANT
COVER LIGHT HANDLE STERIS (MISCELLANEOUS) ×8 IMPLANT
COVER WAND RF STERILE (DRAPES) ×4 IMPLANT
DECANTER SPIKE VIAL GLASS SM (MISCELLANEOUS) ×4 IMPLANT
DURAPREP 26ML APPLICATOR (WOUND CARE) ×4 IMPLANT
ELECT REM PT RETURN 9FT ADLT (ELECTROSURGICAL) ×4
ELECTRODE REM PT RTRN 9FT ADLT (ELECTROSURGICAL) ×2 IMPLANT
GLOVE BIOGEL PI IND STRL 7.0 (GLOVE) ×4 IMPLANT
GLOVE BIOGEL PI IND STRL 9 (GLOVE) ×2 IMPLANT
GLOVE BIOGEL PI INDICATOR 7.0 (GLOVE) ×4
GLOVE BIOGEL PI INDICATOR 9 (GLOVE) ×2
GLOVE ECLIPSE 9.0 STRL (GLOVE) ×8 IMPLANT
GOWN SPEC L3 XXLG W/TWL (GOWN DISPOSABLE) ×4 IMPLANT
GOWN STRL REUS W/TWL LRG LVL3 (GOWN DISPOSABLE) ×4 IMPLANT
INST SET LAPROSCOPIC GYN AP (KITS) ×4 IMPLANT
KIT TURNOVER KIT A (KITS) ×4 IMPLANT
NEEDLE INSUFFLATION 120MM (ENDOMECHANICALS) ×4 IMPLANT
NS IRRIG 1000ML POUR BTL (IV SOLUTION) ×4 IMPLANT
PACK PERI GYN (CUSTOM PROCEDURE TRAY) ×4 IMPLANT
PAD ARMBOARD 7.5X6 YLW CONV (MISCELLANEOUS) ×4 IMPLANT
SET BASIN LINEN APH (SET/KITS/TRAYS/PACK) ×4 IMPLANT
SHEARS HARMONIC ACE PLUS 36CM (ENDOMECHANICALS) ×4 IMPLANT
SLEEVE ENDOPATH XCEL 5M (ENDOMECHANICALS) ×8 IMPLANT
SOLUTION ANTI FOG 6CC (MISCELLANEOUS) ×4 IMPLANT
STRIP CLOSURE SKIN 1/4X3 (GAUZE/BANDAGES/DRESSINGS) ×3 IMPLANT
SUT VIC AB 4-0 PS2 27 (SUTURE) ×4 IMPLANT
SUT VICRYL 0 UR6 27IN ABS (SUTURE) ×4 IMPLANT
SYR 10ML LL (SYRINGE) ×4 IMPLANT
TROCAR ENDO BLADELESS 11MM (ENDOMECHANICALS) ×4 IMPLANT
TROCAR XCEL NON-BLD 5MMX100MML (ENDOMECHANICALS) ×4 IMPLANT
TUBING INSUFFLATION (TUBING) ×4 IMPLANT
WARMER LAPAROSCOPE (MISCELLANEOUS) ×4 IMPLANT

## 2018-11-04 NOTE — Op Note (Signed)
Please see the brief operative note for detailed surgical record

## 2018-11-04 NOTE — Brief Op Note (Signed)
11/04/2018  9:15 AM  PATIENT:  Jacqueline Underwood  28 y.o. female  PRE-OPERATIVE DIAGNOSIS:   Elective permanent Sterilization  POST-OPERATIVE DIAGNOSIS:  Sterilization, uterine adhesions  PROCEDURE:  Procedure(s): LAPAROSCOPIC BILATERAL SALPINGECTOMY (Bilateral) LAPAROSCOPIC LYSIS OF ADHESIONS (N/A)  SURGEON:  Surgeon(s) and Role:    Jonnie Kind, MD - Primary  PHYSICIAN ASSISTANT:   ASSISTANTS: none   ANESTHESIA:   local and general  EBL:  0 mL   BLOOD ADMINISTERED:none  DRAINS: none   LOCAL MEDICATIONS USED:  MARCAINE    and Amount: 8 ml  SPECIMEN:  Source of Specimen:  fallopian tubes bilateral  DISPOSITION OF SPECIMEN:  PATHOLOGY  COUNTS:  YES  TOURNIQUET:  * No tourniquets in log *  DICTATION: .Dragon Dictation  PLAN OF CARE: Discharge to home after PACU  PATIENT DISPOSITION:  PACU - hemodynamically stable.   Delay start of Pharmacological VTE agent (>24hrs) due to surgical blood loss or risk of bleeding: not applicable Details of procedure: Patient was taken to the operating room prepped and draped for abdominal procedure, with legs in low lithotomy support, yellowfin's, with vaginal prepping and grasping of the cervix with single-tooth tenaculum and Hulka uterine manipulator, after completion of timeout and abdominal prepping and draping. Procedure involved a umbilical 1 cm vertical incision at the inferior aspects of the umbilicus with sharp dissection through the skin, hemostat blunt dissection through the subcutaneous fat and then introduction of Veress needle while holding the abdomen wall up and orienting the needle toward the pelvis.  Loss-of-resistance technique and water droplet technique were confirming of intraperitoneal location.  3 L CO2 filled the abdomen.  The note 11 mm laparoscopic trocar was introduced under direct visualization and the abdomen and pelvis inspected and confirmed as hemostatic without evidence of trauma.  Photos were taken to  document the adhesions from the anterior lower uterine segment and fundus of the uterus to the anterior abdominal wall.  Somewhat broad-based pedunculated fundal fibroid protruding well above the torsion of the fallopian tubes and round ligaments into the uterine body.  Fallopian tubes were grossly normal as were the ovaries.  The suprapubic and right lower quadrant trochars 5 mm diameter were placed without difficulty under direct visualization.  Attention was then directed to the right fallopian tube which was elevated and harmonic a 7 device used to amputate and coagulate the mesosalpinx.  The tube was extracted and placed in the anterior bladder fornix.  The adhesions to the anterior abdominal wall were then taken down so that the left side can be visualized better and accessed better.  The uterus was manipulated anteriorly, the left fallopian tube identified amputated at the left uterine cornua and then the mesosalpinx again coagulated and transected freeing up both tubes.  The 5 mm camera was placed through the suprapubic site so that the 11 mm umbilical port could be used to extract the fallopian tubes specimens.  This was done without difficulty and then the abdomen filled with 120 cc of saline, hemostasis of the anterior abdominal wall and tubal amputation sites confirmed, the abdomen deflated after removal of the smaller ports and then the anterior abdominal wall closed at the umbilicus with a 0 Vicryl fascial closure followed by subcuticular 4-0 Vicryl closure of all 3 ports.  Marcaine was injected beneath the skin and at the level of the fascia around the umbilicus.  Steri-Strips were applied Band-Aids applied and laparoscopic instruments removed including vaginal devices.  Sponge and needle counts were correct.  Patient recovery room in stable condition

## 2018-11-04 NOTE — Anesthesia Postprocedure Evaluation (Signed)
Anesthesia Post Note  Patient: Facilities manager  Procedure(s) Performed: LAPAROSCOPIC BILATERAL SALPINGECTOMY (Bilateral ) LAPAROSCOPIC LYSIS OF ADHESIONS (N/A )  Patient location during evaluation: Short Stay Anesthesia Type: General Level of consciousness: awake and alert and patient cooperative Pain management: satisfactory to patient Vital Signs Assessment: post-procedure vital signs reviewed and stable Respiratory status: spontaneous breathing Cardiovascular status: stable Postop Assessment: no apparent nausea or vomiting Anesthetic complications: no     Last Vitals:  Vitals:   11/04/18 0915 11/04/18 0931  BP: 101/68 100/66  Pulse: (!) 55 62  Resp: 16 16  Temp:  36.5 C  SpO2: 97% 98%    Last Pain:  Vitals:   11/04/18 0949  TempSrc:   PainSc: 0-No pain                 Darcey Demma

## 2018-11-04 NOTE — Interval H&P Note (Signed)
History and Physical Interval Note:  11/04/2018 7:11 AM  Eliette Rosezetta Schlatter  has presented today for surgery, with the diagnosis of Sterilization.  The various methods of treatment have been discussed with the patient and family. After consideration of risks, benefits and other options for treatment, the patient has consented to  Procedure(s): LAPAROSCOPIC BILATERAL SALPINGECTOMY (Bilateral) as a surgical intervention.  The patient's history has been reviewed, patient examined, no change in status, stable for surgery.  I have reviewed the patient's chart and labs.  Questions were answered to the patient's satisfaction.  LMP was the 19 of May. HCG was negative. CBC    Component Value Date/Time   WBC 7.0 10/31/2018 1432   RBC 3.88 10/31/2018 1432   HGB 12.4 10/31/2018 1432   HGB 10.9 (L) 11/03/2014 0918   HGB 12.1 05/30/2012   HCT 36.8 10/31/2018 1432   HCT 32.3 (L) 11/03/2014 0918   HCT 35 05/30/2012   PLT 268 10/31/2018 1432   PLT 196 11/03/2014 0918   PLT 349 05/30/2012   MCV 94.8 10/31/2018 1432   MCV 91 11/03/2014 0918   MCH 32.0 10/31/2018 1432   MCHC 33.7 10/31/2018 1432   RDW 11.2 (L) 10/31/2018 1432   RDW 12.7 11/03/2014 0918   LYMPHSABS 2.2 10/31/2018 1432   MONOABS 0.5 10/31/2018 1432   EOSABS 0.4 10/31/2018 1432   BASOSABS 0.0 10/31/2018 [place] Zaylan Kissoon

## 2018-11-04 NOTE — Anesthesia Preprocedure Evaluation (Signed)
Anesthesia Evaluation  Patient identified by MRN, date of birth, ID band Patient awake    Reviewed: Allergy & Precautions, NPO status , Patient's Chart, lab work & pertinent test results  Airway Mallampati: I  TM Distance: >3 FB Neck ROM: Full    Dental no notable dental hx. (+) Teeth Intact   Pulmonary neg pulmonary ROS,    Pulmonary exam normal breath sounds clear to auscultation       Cardiovascular Exercise Tolerance: Good negative cardio ROS Normal cardiovascular examI Rhythm:Regular Rate:Normal     Neuro/Psych negative neurological ROS  negative psych ROS   GI/Hepatic negative GI ROS, Neg liver ROS,   Endo/Other  Morbid obesity  Renal/GU negative Renal ROS  negative genitourinary   Musculoskeletal negative musculoskeletal ROS (+)   Abdominal   Peds negative pediatric ROS (+)  Hematology negative hematology ROS (+)   Anesthesia Other Findings   Reproductive/Obstetrics negative OB ROS                             Anesthesia Physical Anesthesia Plan  ASA: II  Anesthesia Plan: General   Post-op Pain Management:    Induction: Intravenous  PONV Risk Score and Plan:   Airway Management Planned:   Additional Equipment:   Intra-op Plan:   Post-operative Plan: Extubation in OR  Informed Consent: I have reviewed the patients History and Physical, chart, labs and discussed the procedure including the risks, benefits and alternatives for the proposed anesthesia with the patient or authorized representative who has indicated his/her understanding and acceptance.     Dental advisory given  Plan Discussed with: CRNA  Anesthesia Plan Comments: (Plan Full PPE use  Plan GETA)        Anesthesia Quick Evaluation

## 2018-11-04 NOTE — Transfer of Care (Signed)
Immediate Anesthesia Transfer of Care Note  Patient: Jacqueline Underwood  Procedure(s) Performed: LAPAROSCOPIC BILATERAL SALPINGECTOMY (Bilateral ) LAPAROSCOPIC LYSIS OF ADHESIONS (N/A )  Patient Location: PACU  Anesthesia Type:General  Level of Consciousness: awake  Airway & Oxygen Therapy: Patient Spontanous Breathing  Post-op Assessment: Report given to RN and Post -op Vital signs reviewed and stable  Post vital signs: Reviewed  Last Vitals:  Vitals Value Taken Time  BP 120/71 11/04/2018  8:45 AM  Temp    Pulse 71 11/04/2018  8:45 AM  Resp 23 11/04/2018  8:45 AM  SpO2 98 % 11/04/2018  8:45 AM  Vitals shown include unvalidated device data.  Last Pain:  Vitals:   11/04/18 0647  TempSrc: Oral  PainSc: 0-No pain         Complications: No apparent anesthesia complications

## 2018-11-04 NOTE — Discharge Instructions (Signed)
Laparoscopic Tubal Ligation, Care After Refer to this sheet in the next few weeks. These instructions provide you with information about caring for yourself after your procedure. Your health care provider may also give you more specific instructions. Your treatment has been planned according to current medical practices, but problems sometimes occur. Call your health care provider if you have any problems or questions after your procedure. What can I expect after the procedure? After the procedure, it is common to have:  A sore throat.  Discomfort in your shoulder.  Mild discomfort or cramping in your abdomen.  Gas pains.  Pain or soreness in the area where the surgical cut (incision) was made.  A bloated feeling.  Tiredness.  Nausea.  Vomiting. Follow these instructions at home: Medicines  Take over-the-counter and prescription medicines only as told by your health care provider.  Do not take aspirin because it can cause bleeding.  Do not drive or operate heavy machinery while taking prescription pain medicine. Activity  Rest for the rest of the day.  Return to your normal activities as told by your health care provider. Ask your health care provider what activities are safe for you. Incision care      Follow instructions from your health care provider about how to take care of your incision. Make sure you: ? Wash your hands with soap and water before you change your bandage (dressing). If soap and water are not available, use hand sanitizer. ? Change your dressing as told by your health care provider. ? Leave stitches (sutures) in place. They may need to stay in place for 2 weeks or longer.  Check your incision area every day for signs of infection. Check for: ? More redness, swelling, or pain. ? More fluid or blood. ? Warmth. ? Pus or a bad smell.  Your stitches are under the skin and do not require removal, as they will dissolve.  Keep the Band-Aid on for 24  hours and allow the Steri-Strips to stay on for 2 to 3 days if they remain well attached Other Instructions  Do not take baths, swim, or use a hot tub until your health care provider approves. You may take showers.  Keep all follow-up visits as told by your health care provider. This is important.  Have someone help you with your daily household tasks for the first few days. Contact a health care provider if:  You have more redness, swelling, or pain around your incision.  Your incision feels warm to the touch.  You have pus or a bad smell coming from your incision.  The edges of your incision break open after the sutures have been removed.  Your pain does not improve after 2-3 days.  You have a rash.  You repeatedly become dizzy or light-headed.  Your pain medicine is not helping.  You are constipated. Get help right away if:  You have a fever.  You faint.  You have increasing pain in your abdomen.  You have severe pain in one or both of your shoulders.  You have fluid or blood coming from your sutures or from your vagina.  You have shortness of breath or difficulty breathing.  You have chest pain or leg pain.  You have ongoing nausea, vomiting, or diarrhea. This information is not intended to replace advice given to you by your health care provider. Make sure you discuss any questions you have with your health care provider. Document Released: 12/01/2004 Document Revised: 01/08/2017 Document Reviewed: 04/24/2015  Elsevier Interactive Patient Education  2019 [place] Anesthesia, Adult, Care After This sheet gives you information about how to care for yourself after your procedure. Your health care provider may also give you more specific instructions. If you have problems or questions, contact your health care provider. What can I expect after the procedure? After the procedure, the following side effects are  common:  Pain or discomfort at the IV site.  Nausea.  Vomiting.  Sore throat.  Trouble concentrating.  Feeling cold or chills.  Weak or tired.  Sleepiness and fatigue.  Soreness and body aches. These side effects can affect parts of the body that were not involved in surgery. Follow these instructions at home:  For at least 24 hours after the procedure:  Have a responsible adult stay with you. It is important to have someone help care for you until you are awake and alert.  Rest as needed.  Do not: ? Participate in activities in which you could fall or become injured. ? Drive. ? Use heavy machinery. ? Drink alcohol. ? Take sleeping pills or medicines that cause drowsiness. ? Make important decisions or sign legal documents. ? Take care of children on your own. Eating and drinking  Follow any instructions from your health care provider about eating or drinking restrictions.  When you feel hungry, start by eating small amounts of foods that are soft and easy to digest (bland), such as toast. Gradually return to your regular diet.  Drink enough fluid to keep your urine pale yellow.  If you vomit, rehydrate by drinking water, juice, or clear broth. General instructions  If you have sleep apnea, surgery and certain medicines can increase your risk for breathing problems. Follow instructions from your health care provider about wearing your sleep device: ? Anytime you are sleeping, including during daytime naps. ? While taking prescription pain medicines, sleeping medicines, or medicines that make you drowsy.  Return to your normal activities as told by your health care provider. Ask your health care provider what activities are safe for you.  Take over-the-counter and prescription medicines only as told by your health care provider.  If you smoke, do not smoke without supervision.  Keep all follow-up visits as told by your health care provider. This is  important. Contact a health care provider if:  You have nausea or vomiting that does not get better with medicine.  You cannot eat or drink without vomiting.  You have pain that does not get better with medicine.  You are unable to pass urine.  You develop a skin rash.  You have a fever.  You have redness around your IV site that gets worse. Get help right away if:  You have difficulty breathing.  You have chest pain.  You have blood in your urine or stool, or you vomit blood. Summary  After the procedure, it is common to have a sore throat or nausea. It is also common to feel tired.  Have a responsible adult stay with you for the first 24 hours after general anesthesia. It is important to have someone help care for you until you are awake and alert.  When you feel hungry, start by eating small amounts of foods that are soft and easy to digest (bland), such as toast. Gradually return to your regular diet.  Drink enough fluid to keep your urine  pale yellow.  Return to your normal activities as told by your health care provider. Ask your health care provider what activities are safe for you. This information is not intended to replace advice given to you by your health care provider. Make sure you discuss any questions you have with your health care provider. Document Released: 08/20/2000 Document Revised: 12/28/2016 Document Reviewed: 12/28/2016 Elsevier Interactive Patient Education  2019 [employer].

## 2018-11-04 NOTE — H&P (Signed)
Signed      Expand All Collapse All    Show:Clear all [x] Manual[x] Template[] Copied  Added by: [x] Jonnie Kind, MD[x] Lizabeth Leyden  [] Hover for details Patient ID: Jacqueline Underwood, female   DOB: 1990/08/21, 28 y.o.   MRN: [id number]  Preoperative History and Physical  Jacqueline Underwood is a 28 y.o. [id number] here for surgical management of contraception management. No significant preoperative concerns. Just had recent period 5/23-28/2020 was regular.  Proposed surgery: Laparoscopic Bilateral Salpingectomy      Past Medical History:  Diagnosis Date  . BV (bacterial vaginosis)   . Menorrhagia   . Pregnant   . Uterine fibroid in pregnancy   . Uterine myoma   . UTI (urinary tract infection)   . Yeast infection         Past Surgical History:  Procedure Laterality Date  . CESAREAN SECTION MULTI-GESTATIONAL  11/18/2014   Procedure: CESAREAN SECTION MULTI-GESTATIONAL;  Surgeon: Donnamae Jude, MD;  Location: [facility];  Service: Obstetrics;;  . MYOMECTOMY N/A 04/24/2017   Procedure: ABDOMINAL MYOMECTOMY OF UTERUS;  Surgeon: Florian Buff, MD;  Location: [facility];  Service: Gynecology;  Laterality: N/A;  . NO PAST SURGERIES    . REMOVAL OF NON VAGINAL CONTRACEPTIVE DEVICE Right 04/24/2017   Procedure: REMOVAL OF [place];  Surgeon: Florian Buff, MD;  Location: [facility];  Service: Gynecology;  Laterality: Right;                   OB History  Gravida Para Term Preterm AB Living  3 2 1 1 1 3   SAB TAB Ectopic Multiple Live Births     1     1 3        # Outcome Date GA Lbr Len/2nd Weight Sex Delivery Anes PTL Lv  3A Preterm 11/18/14 [redacted]w[redacted]d  3 lb 9.1 oz (1.619 kg) M CS-LTranv Spinal  LIV  3B Preterm 11/18/14 [redacted]w[redacted]d  4 lb 1.6 oz (1.86 kg) M CS-LTranv Spinal  LIV  2 Term 01/15/13 [redacted]w[redacted]d 18:53 / 01:11 7 lb 3.5 oz (3.274 kg) M Vag-Spont EPI  LIV     Birth Comments: within normal limits  1 SAB 2013          Patient denies any other pertinent  gynecologic issues.         Current Outpatient Medications on File Prior to Visit  Medication Sig Dispense Refill  . norgestimate-ethinyl estradiol (ORTHO-CYCLEN,SPRINTEC,PREVIFEM) 0.25-35 MG-MCG tablet Take 1 tablet by mouth daily. 1 Package 11   No current facility-administered medications on file prior to visit.         Allergies  Allergen Reactions  . Peanuts [Peanut Oil] Itching    Itching in throat.    Social History:   reports that she has never smoked. She has never used smokeless tobacco. She reports current alcohol use. She reports that she does not use drugs.       Family History  Problem Relation Age of Onset  . Hypertension Father   . Diabetes Maternal Grandmother     Review of Systems: Noncontributory  PHYSICAL EXAM: Blood pressure 102/65, pulse 74, height 5' (1.524 m), weight 209 lb 12.8 oz (95.2 kg), last menstrual period 10/18/2018. General appearance - alert, well appearing, and in no distress Chest - clear to auscultation, no wheezes, rales or rhonchi, symmetric air entry Heart - normal rate and regular rhythm Abdomen - soft, nontender, nondistended, no masses or organomegaly Pelvic -   VAGINA: normal appearing vagina  with normal color and discharge, no lesions, light pink discharge consistent with completion of period. Well healed c-section scar CERVIX: normal appearing cervix without discharge or lesions,  UTERUS: uterus is small size well supported. Bladder nml GCCHL collected Extremities - peripheral pulses normal, no pedal edema, no clubbing or cyanosis  Labs: No results found for this or any previous visit (from the past 336 hour(s)).  Imaging Studies: ImagingResults  No results found.    Assessment: Patient Active Problem List   Diagnosis Date Noted  . S/P myomectomy 04/24/2017  . Dysmenorrhea 03/25/2017  . Menometrorrhagia 03/25/2017  . Moody 03/25/2017  . Nexplanon in place 03/25/2017  . Enlarged uterus 03/25/2017   . Preterm delivery, delivered 01/04/2015  . H/O cesarean section 01/04/2015  . Fibroids 10/21/2012    Plan: Patient will undergo surgical management with Laparoscopic Bilateral Salpingectomy  2 weeks post-op after surgery   By signing my name below, I, Samul Dada, attest that this documentation has been prepared under the direction and in the presence of Jonnie Kind, MD. Electronically Signed: [place]. 10/24/18. 12:38 PM.  I personally performed the services described in this documentation, which was SCRIBED in my presence. The recorded information has been reviewed and considered accurate. It has been edited as necessary during review. Jonnie Kind, MD           Electronically signed by Jonnie Kind, MD at 10/24/2018 1:07 PM

## 2018-11-04 NOTE — Anesthesia Procedure Notes (Signed)
Procedure Name: Intubation Date/Time: 11/04/2018 7:39 AM Performed by: Ollen Bowl, CRNA Pre-anesthesia Checklist: Patient identified, Patient being monitored, Timeout performed, Emergency Drugs available and Suction available Patient Re-evaluated:Patient Re-evaluated prior to induction Oxygen Delivery Method: Circle system utilized Preoxygenation: Pre-oxygenation with 100% oxygen Induction Type: IV induction Laryngoscope Size: Mac and 3 Grade View: Grade II Tube type: Oral Tube size: 7.0 mm Number of attempts: 2 Airway Equipment and Method: Stylet Placement Confirmation: ETT inserted through vocal cords under direct vision,  positive ETCO2 and breath sounds checked- equal and bilateral Secured at: 21 cm Tube secured with: Tape Dental Injury: Teeth and Oropharynx as per pre-operative assessment  Comments: Poor visualization with first DL.  Pillow removed from beneath head, second DL, grade 2 view.

## 2018-11-05 ENCOUNTER — Encounter (HOSPITAL_COMMUNITY): Payer: Self-pay | Admitting: Obstetrics and Gynecology

## 2018-11-10 ENCOUNTER — Encounter (HOSPITAL_COMMUNITY): Payer: Self-pay | Admitting: Adult Health

## 2018-11-10 ENCOUNTER — Telehealth: Payer: Self-pay | Admitting: Obstetrics and Gynecology

## 2018-11-10 ENCOUNTER — Emergency Department (HOSPITAL_COMMUNITY)
Admission: EM | Admit: 2018-11-10 | Discharge: 2018-11-10 | Disposition: A | Discharge status: Home / Self Care | Payer: BC Managed Care – PPO | Attending: Emergency Medicine | Admitting: Emergency Medicine

## 2018-11-10 ENCOUNTER — Other Ambulatory Visit: Payer: Self-pay

## 2018-11-10 DIAGNOSIS — J039 Acute tonsillitis, unspecified: Secondary | ICD-10-CM | POA: Insufficient documentation

## 2018-11-10 DIAGNOSIS — J029 Acute pharyngitis, unspecified: Secondary | ICD-10-CM | POA: Diagnosis not present

## 2018-11-10 MED ORDER — AMOXICILLIN 500 MG PO CAPS: 500.0000 mg | ORAL_CAPSULE | Freq: Three times a day (TID) | ORAL | 0 refills | Status: DC

## 2018-11-10 NOTE — Telephone Encounter (Signed)
Pt requesting a note that states she is ok to return to work. Please release to Mychart.

## 2018-11-10 NOTE — ED Triage Notes (Signed)
Presents with sore throat that began yesterday.

## 2018-11-10 NOTE — ED Provider Notes (Signed)
[facility] EMERGENCY DEPARTMENT Provider Note   CSN: [id number] Arrival date & time: 11/10/18  1307    History   Chief Complaint Chief Complaint  Patient presents with   Sore Throat    HPI Jacqueline Underwood is a 28 y.o. female.  Who presents emergency department with chief complaint of sore throat.  She had onset of low-grade fever last night, sore throat, pain with swallowing.  She looked at the back of her throat with a flashlight and noted exudates on her tonsils.  She has a history of previous episodes of strep throat.  She has no known contacts with similar symptoms.  She denies cough, shortness of breath, body aches, chills.  She denies intolerance of swallowing liquids.     HPI  Past Medical History:  Diagnosis Date   BV (bacterial vaginosis)    Menorrhagia    Pregnant    Uterine fibroid in pregnancy    Uterine myoma    UTI (urinary tract infection)    Yeast infection     Patient Active Problem List   Diagnosis Date Noted   S/P myomectomy 04/24/2017   Dysmenorrhea 03/25/2017   Menometrorrhagia 03/25/2017   Moody 03/25/2017   Nexplanon in place 03/25/2017   Encounter for sterilization 01/25/2015   Preterm delivery, delivered 01/04/2015   H/O cesarean section 01/04/2015   Fibroids 10/21/2012    Past Surgical History:  Procedure Laterality Date   CESAREAN SECTION MULTI-GESTATIONAL  11/18/2014   Procedure: CESAREAN SECTION MULTI-GESTATIONAL;  Surgeon: Donnamae Jude, MD;  Location: [facility];  Service: Obstetrics;;   LAPAROSCOPIC BILATERAL SALPINGECTOMY Bilateral 11/04/2018   Procedure: LAPAROSCOPIC BILATERAL SALPINGECTOMY;  Surgeon: Jonnie Kind, MD;  Location: [facility];  Service: Gynecology;  Laterality: Bilateral;   LAPAROSCOPIC LYSIS OF ADHESIONS N/A 11/04/2018   Procedure: LAPAROSCOPIC LYSIS OF ADHESIONS;  Surgeon: Jonnie Kind, MD;  Location: [facility];  Service: Gynecology;  Laterality: N/A;   MYOMECTOMY N/A 04/24/2017   Procedure:  ABDOMINAL MYOMECTOMY OF UTERUS;  Surgeon: Florian Buff, MD;  Location: [facility];  Service: Gynecology;  Laterality: N/A;   NO PAST SURGERIES     REMOVAL OF NON VAGINAL CONTRACEPTIVE DEVICE Right 04/24/2017   Procedure: REMOVAL OF [place];  Surgeon: Florian Buff, MD;  Location: [facility];  Service: Gynecology;  Laterality: Right;     OB History    Gravida  3   Para  2   Term  1   Preterm  1   AB  1   Living  3     SAB  1   TAB      Ectopic      Multiple  1   Live Births  3            Home Medications    Prior to Admission medications   Medication Sig Start Date End Date Taking? Authorizing Provider  amoxicillin (AMOXIL) 500 MG capsule Take 1 capsule (500 mg total) by mouth 3 (three) times daily. 11/10/18   Margarita Mail, PA-C  HYDROcodone-acetaminophen (NORCO/VICODIN) 5-325 MG tablet Take 1 tablet by mouth every 6 (six) hours as needed for moderate pain. May take with ibuprofen 11/04/18   Jonnie Kind, MD    Family History Family History  Problem Relation Age of Onset   Hypertension Father    Diabetes Maternal Grandmother     Social History Social History   Tobacco Use   Smoking status: Never Smoker   Smokeless tobacco: Never Used  Substance Use Topics   Alcohol use: Yes    Comment: weekends   Drug use: No     Allergies   Peanuts [peanut oil]   Review of Systems Review of Systems  Ten systems reviewed and are negative for acute change, except as noted in the HPI.   Physical Exam Updated Vital Signs BP 100/64 (BP Location: Right Arm)    Pulse 87    Temp 98.7 F (37.1 C) (Oral)    Resp 18    Ht 5' (1.524 m)    Wt 92 kg    LMP 10/18/2018 (Exact Date)    SpO2 98%    BMI 39.61 kg/m   Physical Exam  Physical Exam  Nursing note and vitals reviewed. Constitutional: She is oriented to person, place, and time. She appears well-developed and well-nourished. No distress.  HENT:  Head: Normocephalic and atraumatic.  Eyes:  Conjunctivae normal and EOM are normal. Pupils are equal, round, and reactive to light. No scleral icterus.  Neck: Normal range of motion. Mild bilateral tender tonsillar lymphadenopathy Mouth and throat: Tonsillar hypertrophy, erythema and bilateral exudates. Cardiovascular: Normal rate, regular rhythm and normal heart sounds.  Exam reveals no gallop and no friction rub.   No murmur heard. Pulmonary/Chest: Effort normal and breath sounds normal. No respiratory distress.  Abdominal: Soft. Bowel sounds are normal. She exhibits no distension and no mass. There is no tenderness. There is no guarding.  Neurological: She is alert and oriented to person, place, and time.  Skin: Skin is warm and dry. She is not diaphoretic.    ED Treatments / Results  Labs (all labs ordered are listed, but only abnormal results are displayed) Labs Reviewed - No data to display  EKG None  Radiology No results found.  Procedures Procedures (including critical care time)  Medications Ordered in ED Medications - No data to display   Initial Impression / Assessment and Plan / ED Course  I have reviewed the triage vital signs and the nursing notes.  Pertinent labs & imaging results that were available during my care of the patient were reviewed by me and considered in my medical decision making (see chart for details).          Final Clinical Impressions(s) / ED Diagnoses   Final diagnoses:  Tonsillitis with exudate    ED Discharge Orders         Ordered    amoxicillin (AMOXIL) 500 MG capsule  3 times daily     11/10/18 9841        28 year old female with history of previous strep throat episodes here with tonsillitis.  She is tolerating p.o. fluids. Patient will be discharged with amoxicillin.  No evidence of peritonsillar abscess, severe infection, abscess, no airway compromise.  Tolerating p.o. fluids.   Margarita Mail, PA-C 11/10/18 1328    Fredia Sorrow, MD 11/11/18 [phone]

## 2018-11-10 NOTE — Discharge Instructions (Signed)
Get help right away if: You develop any new symptoms, such as vomiting, severe headache, stiff neck, chest pain, trouble breathing, or trouble swallowing. You have severe throat pain along with drooling or voice changes. You have severe pain that is not controlled with medicines. You cannot fully open your mouth. You develop redness, swelling, or severe pain anywhere in your neck.

## 2018-11-11 ENCOUNTER — Encounter: Payer: Self-pay | Admitting: Family Medicine

## 2018-11-11 ENCOUNTER — Other Ambulatory Visit: Payer: Self-pay

## 2018-11-11 ENCOUNTER — Ambulatory Visit (INDEPENDENT_AMBULATORY_CARE_PROVIDER_SITE_OTHER): Payer: BC Managed Care – PPO | Admitting: Family Medicine

## 2018-11-11 ENCOUNTER — Encounter: Payer: Self-pay | Admitting: *Deleted

## 2018-11-11 VITALS — BP 116/68 | HR 90 | Temp 99.1°F | Resp 12 | Ht 60.0 in | Wt 209.0 lb

## 2018-11-11 DIAGNOSIS — Z6841 Body Mass Index (BMI) 40.0 and over, adult: Secondary | ICD-10-CM | POA: Diagnosis not present

## 2018-11-11 DIAGNOSIS — J039 Acute tonsillitis, unspecified: Secondary | ICD-10-CM

## 2018-11-11 NOTE — Progress Notes (Signed)
Subjective:     Patient ID: Jacqueline Underwood, female   DOB: 11/09/1990, 28 y.o.   MRN: [id number]  Jacqueline Underwood presents for New Patient (Initial Visit) (establish care) Jacqueline Underwood is a 28 year old female patient who presents today to establish care.  Was just seen yesterday in the emergency room.  Diagnosed with tonsillitis with exudate. Presented to emergency department with chief complaint of sore throat.  She had onset of low-grade fever last night, sore throat, pain with swallowing.  She looked at the back of her throat with a flashlight and noted exudates on her tonsils.  She has a history of previous episodes of strep throat.  She has no known contacts with similar symptoms.  She denies cough, shortness of breath, body aches, chills.  She denies intolerance of swallowing liquids.  She was provided with amoxicillin.  There is no evidence of peritonsillar abscess, severe infection, abscess, airway compromise.  Tolerating p.o.'s no need for hospitalization.  Has a health history that includes status post myectomy, dysmenorrhea, laparoscopic tube removal, cesarean sections, fibroids.  Today she only presents with what was previously diagnosed yesterday, fever, chills, trouble swallowing secondary to having tonsillitis, some ear pain related to it.  Denies having any cough, shortness of breath, chest pain or any other trouble swallowing food or liquid.  Reports that she still is not feeling 100% but is feeling slightly better than she was prior to starting her antibiotics.  She has no other issues or concerns to discuss today during the visit.  Lives with her 3 sons.  2 are twins they are the youngest at 63 years old.  Oldest son is 5.  Father is involved, she is not married to the father however though.  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  Enjoys spending time with her children as they take up most of her time she does not do much else per her.  Reports that her  father is close by and she sees him frequently.  Reports that her diet is not the best she eats fast food, red meat, lots of portions she goes back for seconds frequently.  But she does like to eat fruits and veggies.  She is not very big on sweets.  Reports that she loves to drink sweet tea however though.  Additionally drinks several bottles of water a day but not as much as she should.  Reports wearing her seatbelt and sunscreen as needed.  Has smoke detectors and carbon monoxide attackers in the house.  Reports she does not use her phone while driving.  Denies any alcohol or tobacco use.  Past Medical, Surgical, Social History, Allergies, and Medications have been Reviewed.    Past Medical History:  Diagnosis Date  . BV (bacterial vaginosis)   . Menorrhagia   . Pregnant   . Uterine fibroid in pregnancy   . Uterine myoma   . UTI (urinary tract infection)   . Yeast infection    Past Surgical History:  Procedure Laterality Date  . CESAREAN SECTION MULTI-GESTATIONAL  11/18/2014   Procedure: CESAREAN SECTION MULTI-GESTATIONAL;  Surgeon: Donnamae Jude, MD;  Location: [facility];  Service: Obstetrics;;  . LAPAROSCOPIC BILATERAL SALPINGECTOMY Bilateral 11/04/2018   Procedure: LAPAROSCOPIC BILATERAL SALPINGECTOMY;  Surgeon: Jonnie Kind, MD;  Location: [facility];  Service: Gynecology;  Laterality: Bilateral;  . LAPAROSCOPIC LYSIS OF ADHESIONS N/A 11/04/2018   Procedure: LAPAROSCOPIC LYSIS OF ADHESIONS;  Surgeon: Jonnie Kind, MD;  Location: [facility];  Service: Gynecology;  Laterality: N/A;  . MYOMECTOMY N/A 04/24/2017   Procedure: ABDOMINAL MYOMECTOMY OF UTERUS;  Surgeon: Florian Buff, MD;  Location: [facility];  Service: Gynecology;  Laterality: N/A;  . NO PAST SURGERIES    . REMOVAL OF NON VAGINAL CONTRACEPTIVE DEVICE Right 04/24/2017   Procedure: REMOVAL OF [place];  Surgeon: Florian Buff, MD;  Location: [facility];  Service: Gynecology;  Laterality: Right;   Social History    Socioeconomic History  . Marital status: Single    Spouse name: Not on file  . Number of children: 3  . Years of education: Not on file  . Highest education level: Not on file  Occupational History  . Not on file  Social Needs  . Financial resource strain: Not on file  . Food insecurity    Worry: Not on file    Inability: Not on file  . Transportation needs    Medical: Not on file    Non-medical: Not on file  Tobacco Use  . Smoking status: Never Smoker  . Smokeless tobacco: Never Used  Substance and Sexual Activity  . Alcohol use: Yes    Comment: weekends  . Drug use: No  . Sexual activity: Yes    Birth control/protection: Pill  Lifestyle  . Physical activity    Days per week: Not on file    Minutes per session: Not on file  . Stress: Not on file  Relationships  . Social Herbalist on phone: Not on file    Gets together: Not on file    Attends religious service: Not on file    Active member of club or organization: Not on file    Attends meetings of clubs or organizations: Not on file    Relationship status: Not on file  . Intimate partner violence    Fear of current or ex partner: Not on file    Emotionally abused: Not on file    Physically abused: Not on file    Forced sexual activity: Not on file  Other Topics Concern  . Not on file  Social History Narrative  . Not on file    Outpatient Encounter Medications as of 11/11/2018  Medication Sig  . amoxicillin (AMOXIL) 500 MG capsule Take 1 capsule (500 mg total) by mouth 3 (three) times daily.  . [DISCONTINUED] HYDROcodone-acetaminophen (NORCO/VICODIN) 5-325 MG tablet Take 1 tablet by mouth every 6 (six) hours as needed for moderate pain. May take with ibuprofen (Patient not taking: Reported on 11/11/2018)   No facility-administered encounter medications on file as of 11/11/2018.    Allergies  Allergen Reactions  . Peanuts [Peanut Oil] Itching    Itching in throat.    Review of Systems   Constitutional: Positive for chills and fever. Negative for activity change and appetite change.  HENT: Positive for ear discharge, ear pain and trouble swallowing.   Eyes: Negative.   Respiratory: Negative.  Negative for cough and shortness of breath.   Cardiovascular: Negative.  Negative for chest pain, palpitations and leg swelling.  Gastrointestinal: Negative.   Endocrine: Negative.   Genitourinary: Negative.   Musculoskeletal: Negative.   Skin: Negative.   Allergic/Immunologic: Negative.   Neurological: Negative.  Negative for dizziness and light-headedness.  Hematological: Negative.   Psychiatric/Behavioral: Negative.  Negative for sleep disturbance.  All other systems reviewed and are negative.      Objective:     BP 116/68   Pulse 90  Temp 99.1 F (37.3 C) (Oral)   Resp 12   Ht 5' (1.524 m)   Wt 209 lb (94.8 kg)   LMP 10/18/2018 (Exact Date)   SpO2 98%   BMI 40.82 kg/m   Physical Exam Constitutional:      Appearance: Normal appearance. She is obese.  HENT:     Head: Normocephalic and atraumatic.     Right Ear: External ear normal.     Left Ear: External ear normal.     Nose: Nose normal.     Mouth/Throat:     Lips: Pink.     Mouth: Mucous membranes are moist.     Tonsils: Tonsillar exudate present. 3+ on the right. 3+ on the left.  Eyes:     General:        Right eye: No discharge.        Left eye: No discharge.     Conjunctiva/sclera: Conjunctivae normal.  Neck:     Musculoskeletal: Normal range of motion.  Cardiovascular:     Rate and Rhythm: Normal rate and regular rhythm.     Pulses: Normal pulses.     Heart sounds: Normal heart sounds.  Pulmonary:     Effort: Pulmonary effort is normal.     Breath sounds: Normal breath sounds.  Musculoskeletal: Normal range of motion.  Skin:    General: Skin is warm.  Neurological:     Mental Status: She is alert and oriented to person, place, and time.  Psychiatric:        Mood and Affect: Mood normal.         Behavior: Behavior normal.        Thought Content: Thought content normal.        Judgment: Judgment normal.        Assessment and Plan        1. Tonsillitis On treatment from ER. Advised to continue full course of medication. Follow up if no improvement.   2. Class 3 severe obesity due to excess calories without serious comorbidity with body mass index (BMI) of 40.0 to 44.9 in adult [facility]) Encouraged to start some lifestyle changes.  She verbalized she will start managing her weight, taking in less sugar, increasing her water, watching her portion control, and trying to workout 30 to 60 minutes at least 5 days a week.  Has 3 children at home so she runs around a lot.  We will try to get them out to the park so that she can start walking around the park as a play.  Provided with a handout today to help with exercising to lose weight and how to pick proper foods.   Follow Up: 1 year for annual or as needed.  Is being followed [facility] [place].  Perlie Mayo, DNP, AGNP-BC [place], [place], [place] [address] Office Hours: Mon-Thurs 8 am-5 pm; Fri 8 am-12 pm Office Phone:  [phone]  Office Fax: [phone]

## 2018-11-11 NOTE — Patient Instructions (Signed)
Thank you for coming into the office today. I appreciate the opportunity to provide you with the care for your health and wellness. Today we discussed: overall health  Follow up: 1 year (annual) or as needed   No labs needed at this time.  Please let me know if you do not get better after you have completed your antibiotics.  GOALS AND HOMEWORK:  Focus on weight loss: reduce sugar intake, fried and fatty foods, reduce portions, increase water and exercise.   Please continue to practice social distancing.   [place] YOUR HANDS WELL AND FREQUENTLY. AVOID TOUCHING YOUR FACE, UNLESS YOUR HANDS ARE FRESHLY WASHED.   GET FRESH AIR DAILY. STAY HYDRATED WITH WATER.   It was a pleasure to see you and I look forward to continuing to work together on your health and well-being. Please do not hesitate to call the office if you need care or have questions about your care.  Have a wonderful day and week.  With Gratitude,  Cherly Beach, DNP, AGNP-BC    Exercising to Lose Weight Exercise is structured, repetitive physical activity to improve fitness and health. Getting regular exercise is important for everyone. It is especially important if you are overweight. Being overweight increases your risk of heart disease, stroke, diabetes, high blood pressure, and several types of cancer. Reducing your calorie intake and exercising can help you lose weight. Exercise is usually categorized as moderate or vigorous intensity. To lose weight, most people need to do a certain amount of moderate-intensity or vigorous-intensity exercise each week. Moderate-intensity exercise  Moderate-intensity exercise is any activity that gets you moving enough to burn at least three times more energy (calories) than if you were sitting. Examples of moderate exercise include:  Walking a mile in 15 minutes.  Doing light yard work.  Biking at an easy pace. Most people should get at least 300 minutes (5 hours ) a  week of moderate-intensity exercise to maintain their body weight. Vigorous-intensity exercise Vigorous-intensity exercise is any activity that gets you moving enough to burn at least six times more calories than if you were sitting. When you exercise at this intensity, you should be working hard enough that you are not able to carry on a conversation. Examples of vigorous exercise include:  Running.  Playing a team sport, such as football, basketball, and soccer.  Jumping rope. Most people should get at least 75 minutes (1 hour and 15 minutes) a week of vigorous-intensity exercise to maintain their body weight. How can exercise affect me? When you exercise enough to burn more calories than you eat, you lose weight. Exercise also reduces body fat and builds muscle. The more muscle you have, the more calories you burn. Exercise also:  Improves mood.  Reduces stress and tension.  Improves your overall fitness, flexibility, and endurance.  Increases bone strength. The amount of exercise you need to lose weight depends on:  Your age.  The type of exercise.  Any health conditions you have.  Your overall physical ability. Talk to your health care provider about how much exercise you need and what types of activities are safe for you. What actions can I take to lose weight? Nutrition   Make changes to your diet as told by your health care provider or diet and nutrition specialist (dietitian). This may include: ? Eating fewer calories. ? Eating more protein. ? Eating less unhealthy fats. ? Eating a diet that includes fresh fruits and vegetables, whole grains, low-fat dairy products,  and lean protein. ? Avoiding foods with added fat, salt, and sugar.  Drink plenty of water while you exercise to prevent dehydration or heat stroke. Activity  Choose an activity that you enjoy and set realistic goals. Your health care provider can help you make an exercise plan that works for you.   Exercise at a moderate or vigorous intensity most days of the week. ? The intensity of exercise may vary from person to person. You can tell how intense a workout is for you by paying attention to your breathing and heartbeat. Most people will notice their breathing and heartbeat get faster with more intense exercise.  Do resistance training twice each week, such as: ? Push-ups. ? Sit-ups. ? Lifting weights. ? Using resistance bands.  Getting short amounts of exercise can be just as helpful as long structured periods of exercise. If you have trouble finding time to exercise, try to include exercise in your daily routine. ? Get up, stretch, and walk around every 30 minutes throughout the day. ? Go for a walk during your lunch break. ? Park your car farther away from your destination. ? If you take public transportation, get off one stop early and walk the rest of the way. ? Make phone calls while standing up and walking around. ? Take the stairs instead of elevators or escalators.  Wear comfortable clothes and shoes with good support.  Do not exercise so much that you hurt yourself, feel dizzy, or get very short of breath. Where to find more information  U.S. Department of Health and Human Services: [employer].at  Centers for Disease Control and Prevention (CDC): http://www.wolf.info/ Contact a health care provider:  Before starting a new exercise program.  If you have questions or concerns about your weight.  If you have a medical problem that keeps you from exercising. Get help right away if you have any of the following while exercising:  Injury.  Dizziness.  Difficulty breathing or shortness of breath that does not go away when you stop exercising.  Chest pain.  Rapid heartbeat. Summary  Being overweight increases your risk of heart disease, stroke, diabetes, high blood pressure, and several types of cancer.  Losing weight happens when you burn more calories than you eat.   Reducing the amount of calories you eat in addition to getting regular moderate or vigorous exercise each week helps you lose weight. This information is not intended to replace advice given to you by your health care provider. Make sure you discuss any questions you have with your health care provider. Document Released: 06/16/2010 Document Revised: 05/27/2017 Document Reviewed: 05/27/2017 Elsevier Interactive Patient Education  2019 [employer].

## 2018-11-12 ENCOUNTER — Encounter: Payer: Self-pay | Admitting: Obstetrics and Gynecology

## 2018-11-12 ENCOUNTER — Ambulatory Visit (INDEPENDENT_AMBULATORY_CARE_PROVIDER_SITE_OTHER): Payer: BC Managed Care – PPO | Admitting: Obstetrics and Gynecology

## 2018-11-12 ENCOUNTER — Encounter: Payer: Self-pay | Admitting: Family Medicine

## 2018-11-12 ENCOUNTER — Other Ambulatory Visit: Payer: Self-pay

## 2018-11-12 VITALS — BP 99/65 | HR 62 | Ht 60.0 in | Wt 212.6 lb

## 2018-11-12 DIAGNOSIS — Z9889 Other specified postprocedural states: Secondary | ICD-10-CM

## 2018-11-12 DIAGNOSIS — Z09 Encounter for follow-up examination after completed treatment for conditions other than malignant neoplasm: Secondary | ICD-10-CM

## 2018-11-12 NOTE — Progress Notes (Signed)
Patient ID: Jacqueline Underwood, female   DOB: 04/22/1991, 28 y.o.   MRN: [id number]     Subjective:  Jacqueline Underwood is a 28 y.o. female now 1 weeks status post Laparoscopic bilateral salpingectomy and lysis of adhesions.    Review of Systems Negative except None   Diet:   normal   Bowel movements : normal.  The patient is not having any pain.  Objective:  BP 99/65 (BP Location: Right Arm, Patient Position: Sitting, Cuff Size: Normal)    Pulse 62    Ht 5' (1.524 m)    Wt 212 lb 9.6 oz (96.4 kg)    LMP 10/18/2018 (Exact Date)    BMI 41.52 kg/m  General:Well developed, well nourished.  No acute distress. Abdomen: Bowel sounds normal, soft, non-tender. Pelvic Exam: DEFERRED  Incision(s): 3 small incisions all well healed  Assessment:  Post-Op 1 weeks s/p Laparoscopic bilateral salpingectomy and lysis of adhesions   Doing well postoperatively.   Plan:  1. Activity restrictions: none 2. return to work: now given work note 3. Follow up in PRN  By signing my name below, I, Samul Dada, attest that this documentation has been prepared under the direction and in the presence of Jonnie Kind, MD. Electronically Signed: [place]. 11/12/18. 9:51 AM.  I personally performed the services described in this documentation, which was SCRIBED in my presence. The recorded information has been reviewed and considered accurate. It has been edited as necessary during review. Jonnie Kind, MD

## 2019-02-08 ENCOUNTER — Encounter (HOSPITAL_COMMUNITY): Payer: Self-pay | Admitting: Emergency Medicine

## 2019-02-08 ENCOUNTER — Other Ambulatory Visit: Payer: Self-pay

## 2019-02-08 ENCOUNTER — Emergency Department (HOSPITAL_COMMUNITY)
Admission: EM | Admit: 2019-02-08 | Discharge: 2019-02-08 | Disposition: A | Discharge status: Home / Self Care | Payer: BC Managed Care – PPO | Attending: Emergency Medicine | Admitting: Emergency Medicine

## 2019-02-08 DIAGNOSIS — Z8616 Personal history of COVID-19: Secondary | ICD-10-CM

## 2019-02-08 DIAGNOSIS — Z8619 Personal history of other infectious and parasitic diseases: Secondary | ICD-10-CM

## 2019-02-08 DIAGNOSIS — Z91010 Allergy to peanuts: Secondary | ICD-10-CM | POA: Insufficient documentation

## 2019-02-08 DIAGNOSIS — R112 Nausea with vomiting, unspecified: Secondary | ICD-10-CM | POA: Diagnosis not present

## 2019-02-08 DIAGNOSIS — R509 Fever, unspecified: Secondary | ICD-10-CM | POA: Diagnosis not present

## 2019-02-08 DIAGNOSIS — Z20822 Contact with and (suspected) exposure to covid-19: Secondary | ICD-10-CM

## 2019-02-08 DIAGNOSIS — U071 COVID-19: Secondary | ICD-10-CM | POA: Insufficient documentation

## 2019-02-08 HISTORY — DX: Personal history of COVID-19: Z86.16

## 2019-02-08 HISTORY — DX: Personal history of other infectious and parasitic diseases: Z86.19

## 2019-02-08 NOTE — ED Triage Notes (Signed)
Patient c/o fever, chills, nausea , and diarrhea. Patient recently exposed to Covid through work and grandmother. Patient states highest temp 101. Patient reports taking Motrin last night at 12:30am.

## 2019-02-08 NOTE — ED Provider Notes (Signed)
[facility] EMERGENCY DEPARTMENT Provider Note   CSN: [id number] Arrival date & time: 02/08/19  1350     History   Chief Complaint Chief Complaint  Patient presents with  . Fever    HPI Jacqueline Underwood is a 28 y.o. female.     Patient is a 28 year old female with noncontributory past medical history presenting to the emergency department for symptoms of COVID-19 after exposure.  Patient reports that she works in a setting where other people have been near her and have tested positive.  She is not sure the exact date of her exposure.  She does state that she has been having symptoms since last night.  Reports a fever of 101.0.  Reports cough, nasal congestion, 1 episode of vomiting, and body aches.  She is able to keep down fluids and stay hydrated.  Denies any significant diarrhea.  Does not have any shortness of breath or chest pain.     Past Medical History:  Diagnosis Date  . BV (bacterial vaginosis)   . Menorrhagia   . Pregnant   . Uterine fibroid in pregnancy   . Uterine myoma   . UTI (urinary tract infection)   . Yeast infection     Patient Active Problem List   Diagnosis Date Noted  . S/P myomectomy 04/24/2017  . Dysmenorrhea 03/25/2017  . Menometrorrhagia 03/25/2017  . [place] 03/25/2017  . Nexplanon in place 03/25/2017  . Encounter for sterilization 01/25/2015  . Preterm delivery, delivered 01/04/2015  . H/O cesarean section 01/04/2015  . Fibroids 10/21/2012    Past Surgical History:  Procedure Laterality Date  . CESAREAN SECTION MULTI-GESTATIONAL  11/18/2014   Procedure: CESAREAN SECTION MULTI-GESTATIONAL;  Surgeon: Donnamae Jude, MD;  Location: [facility];  Service: Obstetrics;;  . LAPAROSCOPIC BILATERAL SALPINGECTOMY Bilateral 11/04/2018   Procedure: LAPAROSCOPIC BILATERAL SALPINGECTOMY;  Surgeon: Jonnie Kind, MD;  Location: [facility];  Service: Gynecology;  Laterality: Bilateral;  . LAPAROSCOPIC LYSIS OF ADHESIONS N/A 11/04/2018   Procedure: LAPAROSCOPIC  LYSIS OF ADHESIONS;  Surgeon: Jonnie Kind, MD;  Location: [facility];  Service: Gynecology;  Laterality: N/A;  . MYOMECTOMY N/A 04/24/2017   Procedure: ABDOMINAL MYOMECTOMY OF UTERUS;  Surgeon: Florian Buff, MD;  Location: [facility];  Service: Gynecology;  Laterality: N/A;  . NO PAST SURGERIES    . REMOVAL OF NON VAGINAL CONTRACEPTIVE DEVICE Right 04/24/2017   Procedure: REMOVAL OF [place];  Surgeon: Florian Buff, MD;  Location: [facility];  Service: Gynecology;  Laterality: Right;  . TUBAL LIGATION       OB History    Gravida  3   Para  2   Term  1   Preterm  1   AB  1   Living  3     SAB  1   TAB      Ectopic      Multiple  1   Live Births  3            Home Medications    Prior to Admission medications   Not on File    Family History Family History  Problem Relation Age of Onset  . Hypertension Father   . Diabetes Maternal Grandmother     Social History Social History   Tobacco Use  . Smoking status: Never Smoker  . Smokeless tobacco: Never Used  Substance Use Topics  . Alcohol use: Yes    Comment: weekends  . Drug use: No  Allergies   Peanuts [peanut oil]   Review of Systems Review of Systems  Constitutional: Positive for appetite change, fatigue and fever. Negative for diaphoresis.  HENT: Positive for congestion, rhinorrhea and sore throat. Negative for ear discharge, ear pain, sinus pressure and sinus pain.   Respiratory: Positive for cough. Negative for shortness of breath, wheezing and stridor.   Cardiovascular: Negative.   Gastrointestinal: Positive for nausea and vomiting. Negative for abdominal pain, blood in stool, constipation and diarrhea.  Genitourinary: Negative for dysuria.  Musculoskeletal: Positive for myalgias. Negative for arthralgias.  Skin: Negative for rash and wound.  Neurological: Negative for dizziness and light-headedness.  All other systems reviewed and are negative.    Physical Exam Updated  Vital Signs BP 112/67 (BP Location: Right Arm)   Pulse 97   Temp 98.2 F (36.8 C) (Oral)   Resp 16   Ht 5' (1.524 m)   Wt 95.3 kg   LMP 01/30/2019   SpO2 98%   BMI 41.01 kg/m   Physical Exam Vitals signs and nursing note reviewed.  Constitutional:      Appearance: Normal appearance.  HENT:     Head: Normocephalic.     Right Ear: Tympanic membrane normal.     Left Ear: Tympanic membrane normal.     Mouth/Throat:     Mouth: Mucous membranes are moist.  Eyes:     Conjunctiva/sclera: Conjunctivae normal.  Cardiovascular:     Rate and Rhythm: Normal rate and regular rhythm.     Pulses: Normal pulses.     Heart sounds: Normal heart sounds.  Pulmonary:     Effort: Pulmonary effort is normal.     Breath sounds: Normal breath sounds.  Abdominal:     General: Abdomen is flat.     Tenderness: There is no abdominal tenderness. There is no guarding.  Skin:    General: Skin is dry.  Neurological:     Mental Status: She is alert.  Psychiatric:        Mood and Affect: Mood normal.      ED Treatments / Results  Labs (all labs ordered are listed, but only abnormal results are displayed) Labs Reviewed  NOVEL CORONAVIRUS, NAA (HOSP ORDER, SEND-OUT TO REF LAB; TAT 18-24 HRS)    EKG None  Radiology No results found.  Procedures Procedures (including critical care time)  Medications Ordered in ED Medications - No data to display   Initial Impression / Assessment and Plan / ED Course  I have reviewed the triage vital signs and the nursing notes.  Pertinent labs & imaging results that were available during my care of the patient were reviewed by me and considered in my medical decision making (see chart for details).  Clinical Course as of Feb 07 1422  Sun Feb 08, 2019  1423 Patient has suspected COVID-19.  She does not meet any admission criteria and does not have any comorbidities.  She was advised on self quarantining at home and will be tested with a send out test.    [KM]    Clinical Course User Index [KM] Alveria Apley, PA-C       Based on review of vitals, medical screening exam, lab work and/or imaging, there does not appear to be an acute, emergent etiology for the patient's symptoms. Counseled pt on good return precautions and encouraged both PCP and ED follow-up as needed.  Prior to discharge, I also discussed incidental imaging findings with patient in detail and advised appropriate, recommended follow-up  in detail.  Clinical Impression: 1. Suspected Covid-19 Virus Infection     Disposition: Discharge  Prior to providing a prescription for a controlled substance, I independently reviewed the patient's recent prescription history on the [place]. The patient had no recent or regular prescriptions and was deemed appropriate for a brief, less than 3 day prescription of narcotic for acute analgesia.  This note was prepared with assistance of Systems analyst. Occasional wrong-word or sound-a-like substitutions may have occurred due to the inherent limitations of voice recognition software.   Final Clinical Impressions(s) / ED Diagnoses   Final diagnoses:  Suspected Covid-19 Virus Infection    ED Discharge Orders    None       Alveria Apley, PA-C 02/08/19 [place], [place], DO 02/10/19 1120

## 2019-02-08 NOTE — Discharge Instructions (Signed)
You are suspected to have COVID-19.  You need to go home and stay home and quarantine yourself.  Stay away from your family members as much as possible.  Clean your hands and sanitize everything in your house.  Even if your test results are negative, since her symptoms are very consistent with a virus and you have had exposure you should still quarantine yourself for 10 days from your symptom onset and until you are at least 24 hours fever free without the use of Tylenol and Motrin.  For the next few days you may use over-the-counter medications to help with your cough and cold and congestion symptoms like Robitussin or Mucinex.

## 2019-02-08 NOTE — ED Notes (Signed)
Pt education: should test be positive pt should instruct those she has exposed to consider drive thru testing within 2-3 days (due to results time) She is instructed that self isolation includes staying ain and not going out, having friends over and isolating from other in home

## 2019-02-08 NOTE — ED Notes (Signed)
Pt exposed to covid thru family and employment   Here for test and eval

## 2019-02-09 ENCOUNTER — Encounter: Payer: Self-pay | Admitting: Family Medicine

## 2019-02-09 LAB — NOVEL CORONAVIRUS, NAA (HOSP ORDER, SEND-OUT TO REF LAB; TAT 18-24 HRS): SARS-CoV-2, NAA: DETECTED — AB

## 2019-02-10 ENCOUNTER — Encounter: Payer: Self-pay | Admitting: Family Medicine

## 2019-02-10 ENCOUNTER — Telehealth: Payer: Self-pay | Admitting: *Deleted

## 2019-02-10 NOTE — Telephone Encounter (Signed)
FMLA paper received via walk in Copied Noted Sleeved

## 2019-02-13 DIAGNOSIS — U071 COVID-19: Secondary | ICD-10-CM

## 2019-02-17 ENCOUNTER — Other Ambulatory Visit: Payer: Self-pay

## 2019-02-17 DIAGNOSIS — Z20822 Contact with and (suspected) exposure to covid-19: Secondary | ICD-10-CM

## 2019-02-17 DIAGNOSIS — R6889 Other general symptoms and signs: Secondary | ICD-10-CM | POA: Diagnosis not present

## 2019-02-18 LAB — NOVEL CORONAVIRUS, NAA: SARS-CoV-2, NAA: NOT DETECTED

## 2019-04-26 ENCOUNTER — Emergency Department (HOSPITAL_COMMUNITY)
Admission: EM | Admit: 2019-04-26 | Discharge: 2019-04-26 | Disposition: A | Discharge status: Home / Self Care | Payer: BC Managed Care – PPO | Attending: Emergency Medicine | Admitting: Emergency Medicine

## 2019-04-26 ENCOUNTER — Encounter (HOSPITAL_COMMUNITY): Payer: Self-pay

## 2019-04-26 ENCOUNTER — Other Ambulatory Visit: Payer: Self-pay

## 2019-04-26 DIAGNOSIS — H5712 Ocular pain, left eye: Secondary | ICD-10-CM | POA: Diagnosis not present

## 2019-04-26 DIAGNOSIS — Z5321 Procedure and treatment not carried out due to patient leaving prior to being seen by health care provider: Secondary | ICD-10-CM | POA: Insufficient documentation

## 2019-04-26 NOTE — ED Notes (Signed)
Pt not in waiting room

## 2019-04-26 NOTE — ED Notes (Signed)
Not in [facility]
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
## 2019-04-26 NOTE — ED Notes (Signed)
Not in WR when called 

## 2019-04-27 ENCOUNTER — Encounter (HOSPITAL_COMMUNITY): Payer: Self-pay | Admitting: Emergency Medicine

## 2019-04-27 ENCOUNTER — Emergency Department (HOSPITAL_COMMUNITY): Payer: BC Managed Care – PPO

## 2019-04-27 ENCOUNTER — Other Ambulatory Visit: Payer: Self-pay

## 2019-04-27 ENCOUNTER — Emergency Department (HOSPITAL_COMMUNITY)
Admission: EM | Admit: 2019-04-27 | Discharge: 2019-04-27 | Disposition: A | Discharge status: Home / Self Care | Payer: BC Managed Care – PPO | Attending: Emergency Medicine | Admitting: Emergency Medicine

## 2019-04-27 DIAGNOSIS — Y92411 Interstate highway as the place of occurrence of the external cause: Secondary | ICD-10-CM | POA: Diagnosis not present

## 2019-04-27 DIAGNOSIS — Y999 Unspecified external cause status: Secondary | ICD-10-CM | POA: Insufficient documentation

## 2019-04-27 DIAGNOSIS — Z91010 Allergy to peanuts: Secondary | ICD-10-CM | POA: Diagnosis not present

## 2019-04-27 DIAGNOSIS — Y93I9 Activity, other involving external motion: Secondary | ICD-10-CM | POA: Insufficient documentation

## 2019-04-27 DIAGNOSIS — S0285XA Fracture of orbit, unspecified, initial encounter for closed fracture: Secondary | ICD-10-CM

## 2019-04-27 DIAGNOSIS — S0990XA Unspecified injury of head, initial encounter: Secondary | ICD-10-CM | POA: Diagnosis present

## 2019-04-27 MED ORDER — NAPROXEN 500 MG PO TABS: 500.0000 mg | ORAL_TABLET | Freq: Two times a day (BID) | ORAL | 0 refills | Status: DC

## 2019-04-27 MED ORDER — HYDROCODONE-ACETAMINOPHEN 5-325 MG PO TABS
1.0000 | ORAL_TABLET | Freq: Once | ORAL | Status: AC
  Administered 2019-04-27: 18:00:00 1 via ORAL
  Filled 2019-04-27: qty 1
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
## 2019-04-27 NOTE — ED Notes (Signed)
OD 20/20  OS 20/15  OU 20/20
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
## 2019-05-06 ENCOUNTER — Other Ambulatory Visit: Payer: Self-pay

## 2019-05-06 ENCOUNTER — Encounter (HOSPITAL_BASED_OUTPATIENT_CLINIC_OR_DEPARTMENT_OTHER): Payer: Self-pay | Admitting: *Deleted
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
## 2019-05-07 ENCOUNTER — Other Ambulatory Visit (HOSPITAL_COMMUNITY)
Admission: RE | Admit: 2019-05-07 | Discharge: 2019-05-07 | Disposition: A | Discharge status: Home / Self Care | Payer: BC Managed Care – PPO | Source: Ambulatory Visit | Attending: Plastic Surgery | Admitting: Plastic Surgery

## 2019-05-07 ENCOUNTER — Other Ambulatory Visit: Payer: Self-pay

## 2019-05-08 ENCOUNTER — Other Ambulatory Visit: Payer: Self-pay

## 2019-05-08 DIAGNOSIS — Z20822 Contact with and (suspected) exposure to covid-19: Secondary | ICD-10-CM

## 2019-05-08 NOTE — Progress Notes (Signed)
Patient's chart does not indicate that patient went for pre-procedure covid test. Dr. Para Skeans office called to notify. Spoke with Audree Bane who will follow up with patient.

## 2019-05-09 LAB — NOVEL CORONAVIRUS, NAA: SARS-CoV-2, NAA: NOT DETECTED

## 2019-05-11 ENCOUNTER — Ambulatory Visit (HOSPITAL_BASED_OUTPATIENT_CLINIC_OR_DEPARTMENT_OTHER): Payer: BC Managed Care – PPO | Admitting: Anesthesiology

## 2019-05-11 ENCOUNTER — Other Ambulatory Visit: Payer: Self-pay

## 2019-05-11 ENCOUNTER — Encounter (HOSPITAL_BASED_OUTPATIENT_CLINIC_OR_DEPARTMENT_OTHER)
Admission: RE | Disposition: A | Discharge status: Home / Self Care | Payer: Self-pay | Source: Home / Self Care | Attending: Plastic Surgery

## 2019-05-11 ENCOUNTER — Encounter (HOSPITAL_BASED_OUTPATIENT_CLINIC_OR_DEPARTMENT_OTHER): Payer: Self-pay | Admitting: Plastic Surgery

## 2019-05-11 ENCOUNTER — Ambulatory Visit (HOSPITAL_BASED_OUTPATIENT_CLINIC_OR_DEPARTMENT_OTHER)
Admission: RE | Admit: 2019-05-11 | Discharge: 2019-05-11 | Disposition: A | Discharge status: Home / Self Care | Payer: BC Managed Care – PPO | Attending: Plastic Surgery | Admitting: Plastic Surgery

## 2019-05-11 DIAGNOSIS — H532 Diplopia: Secondary | ICD-10-CM | POA: Diagnosis not present

## 2019-05-11 DIAGNOSIS — S0232XA Fracture of orbital floor, left side, initial encounter for closed fracture: Secondary | ICD-10-CM | POA: Diagnosis present

## 2019-05-11 DIAGNOSIS — Y92410 Unspecified street and highway as the place of occurrence of the external cause: Secondary | ICD-10-CM | POA: Insufficient documentation

## 2019-05-11 DIAGNOSIS — Z791 Long term (current) use of non-steroidal anti-inflammatories (NSAID): Secondary | ICD-10-CM | POA: Diagnosis not present

## 2019-05-11 DIAGNOSIS — S022XXA Fracture of nasal bones, initial encounter for closed fracture: Secondary | ICD-10-CM | POA: Diagnosis not present

## 2019-05-11 DIAGNOSIS — Y9389 Activity, other specified: Secondary | ICD-10-CM | POA: Diagnosis not present

## 2019-05-11 HISTORY — PX: ORIF ORBITAL FRACTURE: SHX5312

## 2019-05-11 HISTORY — DX: Fracture of orbital floor, left side, initial encounter for closed fracture: S02.32XA

## 2019-05-11 LAB — POCT PREGNANCY, URINE: Preg Test, Ur: NEGATIVE

## 2019-05-11 SURGERY — OPEN REDUCTION INTERNAL FIXATION (ORIF) ORBITAL FRACTURE
Anesthesia: General | Site: Eye | Laterality: Left

## 2019-05-11 MED ORDER — OXYCODONE HCL 5 MG PO TABS
5.0000 mg | ORAL_TABLET | ORAL | Status: DC | PRN
  Administered 2019-05-11 (×2): 5 mg via ORAL
  Filled 2019-05-11 (×2): qty 1

## 2019-05-11 MED ORDER — FENTANYL CITRATE (PF) 100 MCG/2ML IJ SOLN
INTRAMUSCULAR | Status: AC
  Filled 2019-05-11: qty 2

## 2019-05-11 MED ORDER — ROCURONIUM BROMIDE 10 MG/ML (PF) SYRINGE
PREFILLED_SYRINGE | INTRAVENOUS | Status: DC | PRN
  Administered 2019-05-11: 60 mg via INTRAVENOUS

## 2019-05-11 MED ORDER — ONDANSETRON HCL 4 MG/2ML IJ SOLN
INTRAMUSCULAR | Status: AC
  Filled 2019-05-11: qty 2

## 2019-05-11 MED ORDER — TOBRAMYCIN-DEXAMETHASONE 0.3-0.1 % OP OINT
TOPICAL_OINTMENT | OPHTHALMIC | Status: DC | PRN
  Administered 2019-05-11: 1 via OPHTHALMIC

## 2019-05-11 MED ORDER — BSS IO SOLN
INTRAOCULAR | Status: DC | PRN
  Administered 2019-05-11: 30 mL via INTRAOCULAR

## 2019-05-11 MED ORDER — KCL IN DEXTROSE-NACL 20-5-0.45 MEQ/L-%-% IV SOLN
INTRAVENOUS | Status: DC
  Administered 2019-05-11: 11:00:00 via INTRAVENOUS
  Filled 2019-05-11: qty 1000

## 2019-05-11 MED ORDER — MIDAZOLAM HCL 2 MG/2ML IJ SOLN
INTRAMUSCULAR | Status: AC
  Filled 2019-05-11: qty 2

## 2019-05-11 MED ORDER — DEXAMETHASONE SODIUM PHOSPHATE 10 MG/ML IJ SOLN
INTRAMUSCULAR | Status: DC | PRN
  Administered 2019-05-11: 5 mg via INTRAVENOUS

## 2019-05-11 MED ORDER — PROPOFOL 10 MG/ML IV BOLUS
INTRAVENOUS | Status: AC
  Filled 2019-05-11: qty 40

## 2019-05-11 MED ORDER — FENTANYL CITRATE (PF) 100 MCG/2ML IJ SOLN
INTRAMUSCULAR | Status: DC | PRN
  Administered 2019-05-11: 50 ug via INTRAVENOUS
  Administered 2019-05-11: 100 ug via INTRAVENOUS

## 2019-05-11 MED ORDER — HYDROMORPHONE HCL 1 MG/ML IJ SOLN
INTRAMUSCULAR | Status: AC
  Filled 2019-05-11: qty 0.5

## 2019-05-11 MED ORDER — BSS IO SOLN
INTRAOCULAR | Status: AC
  Filled 2019-05-11: qty 15

## 2019-05-11 MED ORDER — SUGAMMADEX SODIUM 200 MG/2ML IV SOLN
INTRAVENOUS | Status: DC | PRN
  Administered 2019-05-11: 190 mg via INTRAVENOUS

## 2019-05-11 MED ORDER — HYDROMORPHONE HCL 1 MG/ML IJ SOLN: 0.5000 mg | INTRAMUSCULAR | Status: DC | PRN

## 2019-05-11 MED ORDER — MIDAZOLAM HCL 2 MG/2ML IJ SOLN
INTRAMUSCULAR | Status: DC | PRN
  Administered 2019-05-11: 2 mg via INTRAVENOUS

## 2019-05-11 MED ORDER — ACETAMINOPHEN 325 MG PO TABS: 650.0000 mg | ORAL_TABLET | Freq: Four times a day (QID) | ORAL | Status: DC | PRN

## 2019-05-11 MED ORDER — DEXAMETHASONE SODIUM PHOSPHATE 10 MG/ML IJ SOLN
INTRAMUSCULAR | Status: AC
  Filled 2019-05-11: qty 1

## 2019-05-11 MED ORDER — CEFAZOLIN SODIUM-DEXTROSE 2-4 GM/100ML-% IV SOLN
2.0000 g | INTRAVENOUS | Status: AC
  Administered 2019-05-11: 08:00:00 2 g via INTRAVENOUS

## 2019-05-11 MED ORDER — ACETAMINOPHEN 650 MG RE SUPP: 650.0000 mg | Freq: Four times a day (QID) | RECTAL | Status: DC | PRN

## 2019-05-11 MED ORDER — GLYCOPYRROLATE 0.2 MG/ML IJ SOLN
INTRAMUSCULAR | Status: DC | PRN
  Administered 2019-05-11: .1 mg via INTRAVENOUS

## 2019-05-11 MED ORDER — CEFAZOLIN SODIUM-DEXTROSE 2-4 GM/100ML-% IV SOLN
INTRAVENOUS | Status: AC
  Filled 2019-05-11: qty 100

## 2019-05-11 MED ORDER — PHENOL 1.4 % MT LIQD
1.0000 | OROMUCOSAL | Status: DC | PRN
  Administered 2019-05-11: 1 via OROMUCOSAL
  Filled 2019-05-11: qty 177

## 2019-05-11 MED ORDER — GLYCOPYRROLATE PF 0.2 MG/ML IJ SOSY
PREFILLED_SYRINGE | INTRAMUSCULAR | Status: AC
  Filled 2019-05-11: qty 1

## 2019-05-11 MED ORDER — ARTIFICIAL TEARS OPHTHALMIC OINT
TOPICAL_OINTMENT | OPHTHALMIC | Status: DC | PRN
  Administered 2019-05-11: 1 via OPHTHALMIC

## 2019-05-11 MED ORDER — MEPERIDINE HCL 25 MG/ML IJ SOLN: 6.2500 mg | INTRAMUSCULAR | Status: DC | PRN

## 2019-05-11 MED ORDER — ONDANSETRON HCL 4 MG/2ML IJ SOLN
4.0000 mg | Freq: Once | INTRAMUSCULAR | Status: AC | PRN
  Administered 2019-05-11: 4 mg via INTRAVENOUS

## 2019-05-11 MED ORDER — HYDROMORPHONE HCL 1 MG/ML IJ SOLN
0.2500 mg | INTRAMUSCULAR | Status: DC | PRN
  Administered 2019-05-11 (×3): 0.25 mg via INTRAVENOUS

## 2019-05-11 MED ORDER — LACTATED RINGERS IV SOLN
INTRAVENOUS | Status: DC
  Administered 2019-05-11: 07:00:00 via INTRAVENOUS
  Administered 2019-05-11: 10 mL/h via INTRAVENOUS

## 2019-05-11 MED ORDER — LIDOCAINE 2% (20 MG/ML) 5 ML SYRINGE
INTRAMUSCULAR | Status: AC
  Filled 2019-05-11: qty 5

## 2019-05-11 MED ORDER — TOBRAMYCIN-DEXAMETHASONE 0.3-0.1 % OP OINT
TOPICAL_OINTMENT | OPHTHALMIC | Status: AC
  Filled 2019-05-11: qty 3.5

## 2019-05-11 MED ORDER — TOBRAMYCIN-DEXAMETHASONE 0.3-0.1 % OP OINT: TOPICAL_OINTMENT | Freq: Four times a day (QID) | OPHTHALMIC | Status: DC

## 2019-05-11 MED ORDER — ONDANSETRON HCL 4 MG/2ML IJ SOLN
INTRAMUSCULAR | Status: DC | PRN
  Administered 2019-05-11: 4 mg via INTRAVENOUS

## 2019-05-11 MED ORDER — LIDOCAINE-EPINEPHRINE 1 %-1:100000 IJ SOLN
INTRAMUSCULAR | Status: AC
  Filled 2019-05-11: qty 1

## 2019-05-11 MED ORDER — LIDOCAINE-EPINEPHRINE 1 %-1:100000 IJ SOLN
INTRAMUSCULAR | Status: DC | PRN
  Administered 2019-05-11: 5 mL

## 2019-05-11 MED ORDER — LIDOCAINE HCL (CARDIAC) PF 100 MG/5ML IV SOSY
PREFILLED_SYRINGE | INTRAVENOUS | Status: DC | PRN
  Administered 2019-05-11: 100 mg via INTRAVENOUS

## 2019-05-11 MED ORDER — PROPOFOL 10 MG/ML IV BOLUS
INTRAVENOUS | Status: DC | PRN
  Administered 2019-05-11: 200 mg via INTRAVENOUS

## 2019-05-11 MED ORDER — OXYCODONE HCL 5 MG PO TABS: 5.0000 mg | ORAL_TABLET | ORAL | 0 refills | Status: DC | PRN

## 2019-05-11 SURGICAL SUPPLY — 44 items
APPLICATOR DR MATTHEWS STRL (MISCELLANEOUS) IMPLANT
BENZOIN TINCTURE PRP APPL 2/3 (GAUZE/BANDAGES/DRESSINGS) ×3 IMPLANT
CANISTER SUCT 1200ML W/VALVE (MISCELLANEOUS) IMPLANT
CLOSURE WOUND 1/2 X4 (GAUZE/BANDAGES/DRESSINGS) ×1
COVER WAND RF STERILE (DRAPES) IMPLANT
DECANTER SPIKE VIAL GLASS SM (MISCELLANEOUS) ×3 IMPLANT
DRAPE UTILITY XL STRL (DRAPES) ×3 IMPLANT
ELECT COATED BLADE 2.86 ST (ELECTRODE) IMPLANT
ELECT NEEDLE BLADE 2-5/6 (NEEDLE) ×3 IMPLANT
ELECT REM PT RETURN 9FT ADLT (ELECTROSURGICAL) ×3
ELECTRODE REM PT RTRN 9FT ADLT (ELECTROSURGICAL) ×1 IMPLANT
GAUZE PACKING FOLDED 2  STR (GAUZE/BANDAGES/DRESSINGS)
GAUZE PACKING FOLDED 2 STR (GAUZE/BANDAGES/DRESSINGS) IMPLANT
GLOVE BIO SURGEON STRL SZ 6 (GLOVE) ×6 IMPLANT
GOWN STRL REUS W/ TWL LRG LVL3 (GOWN DISPOSABLE) ×2 IMPLANT
GOWN STRL REUS W/TWL LRG LVL3 (GOWN DISPOSABLE) ×4
IMPL BARRIER ORB 38X50X1.6 (Mesh General) ×1 IMPLANT
IMPLANT BARRIER ORB 38X50X1.6 (Mesh General) ×3 IMPLANT
NEEDLE BLUNT 17GA (NEEDLE) IMPLANT
NEEDLE HYPO 30GX1 BEV (NEEDLE) IMPLANT
NEEDLE PRECISIONGLIDE 27X1.5 (NEEDLE) ×3 IMPLANT
NS IRRIG 1000ML POUR BTL (IV SOLUTION) ×3 IMPLANT
PACK BASIN DAY SURGERY FS (CUSTOM PROCEDURE TRAY) ×3 IMPLANT
PACK ENT DAY SURGERY (CUSTOM PROCEDURE TRAY) ×3 IMPLANT
PATTIES SURGICAL .5 X3 (DISPOSABLE) IMPLANT
PENCIL SMOKE EVACUATOR (MISCELLANEOUS) ×3 IMPLANT
SCREW UPPERFACE 1.2X4M SLFDRIL (Screw) ×3 IMPLANT
SHEILD EYE MED CORNL SHD 22X21 (OPHTHALMIC RELATED) ×3
SHIELD EYE MED CORNL SHD 22X21 (OPHTHALMIC RELATED) ×1 IMPLANT
SLEEVE SCD COMPRESS KNEE MED (MISCELLANEOUS) ×3 IMPLANT
STAPLER VISISTAT 35W (STAPLE) ×3 IMPLANT
STRIP CLOSURE SKIN 1/2X4 (GAUZE/BANDAGES/DRESSINGS) ×2 IMPLANT
SUT MON AB 5-0 P3 18 (SUTURE) IMPLANT
SUT PROLENE 6 0 P 1 18 (SUTURE) IMPLANT
SUT SILK 4 0 P 3 (SUTURE) ×3 IMPLANT
SUT VIC AB 4-0 P-3 18XBRD (SUTURE) IMPLANT
SUT VIC AB 4-0 P3 18 (SUTURE)
SUT VIC AB 4-0 SH 27 (SUTURE)
SUT VIC AB 4-0 SH 27XANBCTRL (SUTURE) IMPLANT
SUT VIC AB 5-0 P-3 18X BRD (SUTURE) IMPLANT
SUT VIC AB 5-0 P3 18 (SUTURE)
SYR BULB 3OZ (MISCELLANEOUS) IMPLANT
TOWEL GREEN STERILE FF (TOWEL DISPOSABLE) ×3 IMPLANT
TRAY DSU PREP LF (CUSTOM PROCEDURE TRAY) ×3 IMPLANT

## 2019-05-11 NOTE — Discharge Summary (Signed)
Physician Discharge Summary  Patient ID: Jacqueline Underwood MRN: [id number] DOB/AGE: 1990/10/04 28 y.o.  Admit date: 05/11/2019 Discharge date: 05/11/2019  Admission Diagnoses: Closed fracture left orbital floor  Discharge Diagnoses:  Active Problems:   Closed fracture of left orbital floor [facility])   Discharged Condition: stable  Hospital Course: Post operatively patient ws observe throughout day of surgery with visual checks. No complaints significant pain and gross vision remained intact. No nausea or emesis.  Treatments: surgery: open treatment left orbital floor fracture with placement implant  Discharge Exam: Blood pressure 107/74, pulse (!) 54, temperature (!) 97.3 F (36.3 C), resp. rate 18, height 5' (1.524 m), weight 93.1 kg, last menstrual period 05/04/2019, SpO2 98 %. Incision/Wound: EOMI, appropriate edema, Frost stitch in place  Disposition: Discharge disposition: 01-Home or Self Care       Discharge Instructions    Call MD for:  redness, tenderness, or signs of infection (pain, swelling, bleeding, redness, odor or green/yellow discharge around incision site)   Complete by: As directed    Discharge instructions   Complete by: As directed    Ok to shower am 12.15.20, soap and water ok. Pat steri strips dry.  Sleep with head elevated on 2-3 pillows through follow up visit.  Call office for appt to remove the stitch and tape on 12.15 or 12.16   No strenuous activity or exercise or house or yard work until cleared by MD   Driving Restrictions   Complete by: As directed    No driving if taking narcotics, no driving while stitch in place over left eyelid.   Lifting restrictions   Complete by: As directed    No lifting > 5-10 lbs until cleared by MD   Resume previous diet   Complete by: As directed      Allergies as of 05/11/2019      Reactions   Peanuts [peanut Oil] Itching   Itching in throat.      Medication List    TAKE these medications   naproxen  500 MG tablet Commonly known as: NAPROSYN Take 1 tablet (500 mg total) by mouth 2 (two) times daily.   oxyCODONE 5 MG immediate release tablet Commonly known as: Oxy IR/ROXICODONE Take 1 tablet (5 mg total) by mouth every 4 (four) hours as needed for moderate pain.      Follow-up Information    Follow up In 1 week.   Why: as scheduled          Signed: Irene Limbo 05/11/2019, 5:03 PM

## 2019-05-11 NOTE — Discharge Instructions (Signed)

## 2019-05-11 NOTE — Interval H&P Note (Signed)
History and Physical Interval Note:  05/11/2019 6:58 AM  Jacqueline Underwood  has presented today for surgery, with the diagnosis of closed left orbital floor fracture.  The various methods of treatment have been discussed with the patient and family. After consideration of risks, benefits and other options for treatment, the patient has consented to  Procedure(s): OPEN TREATMENT LEFT ORBITAL FLOOR FRACTURE WITH IMPLANT PERIORBITAL APPROACH (Left) as a surgical intervention.  The patient's history has been reviewed, patient examined, no change in status, stable for surgery.  I have reviewed the patient's chart and labs.  Questions were answered to the patient's satisfaction.     Arnoldo Hooker Shlomo Seres

## 2019-05-11 NOTE — Anesthesia Postprocedure Evaluation (Signed)
Anesthesia Post Note  Patient: Facilities manager  Procedure(s) Performed: OPEN TREATMENT LEFT ORBITAL FLOOR FRACTURE WITH IMPLANT PERIORBITAL APPROACH (Left Eye)     Patient location during evaluation: PACU Anesthesia Type: General Level of consciousness: awake and alert Pain management: pain level controlled Vital Signs Assessment: post-procedure vital signs reviewed and stable Respiratory status: spontaneous breathing, nonlabored ventilation, respiratory function stable and patient connected to nasal cannula oxygen Cardiovascular status: blood pressure returned to baseline and stable Postop Assessment: no apparent nausea or vomiting Anesthetic complications: no    Last Vitals:  Vitals:   05/11/19 1015 05/11/19 1045  BP: 108/67 107/74  Pulse: (!) 49 (!) 49  Resp: 16 18  Temp:  (!) 36.3 C  SpO2: 95% 98%    Last Pain:  Vitals:   05/11/19 1045  TempSrc:   PainSc: 5                  Nasim Garofano DAVID

## 2019-05-11 NOTE — Anesthesia Procedure Notes (Signed)
Procedure Name: Intubation Date/Time: 05/11/2019 7:40 AM Performed by: Raenette Rover, CRNA Pre-anesthesia Checklist: Patient identified, Emergency Drugs available, Suction available and Patient being monitored Patient Re-evaluated:Patient Re-evaluated prior to induction Oxygen Delivery Method: Circle system utilized Preoxygenation: Pre-oxygenation with 100% oxygen Induction Type: IV induction Ventilation: Mask ventilation without difficulty Laryngoscope Size: Mac and 3 Grade View: Grade II Tube type: Oral Rae Tube size: 7.0 mm Number of attempts: 1 Airway Equipment and Method: Stylet Placement Confirmation: ETT inserted through vocal cords under direct vision,  positive ETCO2 and breath sounds checked- equal and bilateral Secured at: 21 cm Tube secured with: Tape Dental Injury: Teeth and Oropharynx as per pre-operative assessment

## 2019-05-11 NOTE — Transfer of Care (Signed)
Immediate Anesthesia Transfer of Care Note  Patient: Jacqueline Underwood  Procedure(s) Performed: OPEN TREATMENT LEFT ORBITAL FLOOR FRACTURE WITH IMPLANT PERIORBITAL APPROACH (Left Eye)  Patient Location: PACU  Anesthesia Type:General  Level of Consciousness: awake, alert , oriented, drowsy and patient cooperative  Airway & Oxygen Therapy: Patient Spontanous Breathing and Patient connected to nasal cannula oxygen  Post-op Assessment: Report given to RN and Post -op Vital signs reviewed and stable  Post vital signs: Reviewed and stable  Last Vitals:  Vitals Value Taken Time  BP 113/83 05/11/19 0842  Temp    Pulse 77 05/11/19 0844  Resp 11 05/11/19 0844  SpO2 100 % 05/11/19 0844  Vitals shown include unvalidated device data.  Last Pain:  Vitals:   05/11/19 0637  TempSrc: Tympanic  PainSc: 0-No pain      Patients Stated Pain Goal: 8 ([id number])  Complications: No apparent anesthesia complications

## 2019-05-11 NOTE — Op Note (Signed)
Operative Note   DATE OF OPERATION: 12.14.20  LOCATION: [facility]-observation  SURGICAL DIVISION: Plastic Surgery  PREOPERATIVE DIAGNOSES:  Closed left orbital floor fracture blowout  POSTOPERATIVE DIAGNOSES:  same  PROCEDURE:  Open treatment left orbital floor fracture with implant, periorbital approach  SURGEON: Irene Limbo MD MBA  ASSISTANT: none  ANESTHESIA:  General.   EBL: 5 ml  COMPLICATIONS: None immediate.   INDICATIONS FOR PROCEDURE:  The patient, Jacqueline Underwood, is a 28 y.o. female born on 1990-06-19, is here for treatment left orbital floor fracture with diplopia.   FINDINGS: Medpor Orbital Floor Barrier sheet implant placed. No entrapment on forced duction test following implant placement.  DESCRIPTION OF PROCEDURE:  The patient's operative site was marked with the patient in the preoperative area. The patientwas taken to the operating room. SCDs were placed and IV antibiotics were given. Steri strips applied over right eyelid. Ophthalmic lubricant and scleral shield placed in left eye.The patient's operative site was prepped and draped inusualfashion. A time out was performed and all information was confirmed to be correct.Local anesthetic infiltrated to perform left supraorbital and infraorbital nerve blocks. Additional local anesthetic infiltrated in conjunctiva. Desmarres retractor placed over lower eyelid and incision transconjunctival made onto infraorbital rim. Dissection completed along orbital floor to expose defect and bony edges of defect. Herniated orbital contents reduced from maxillary sinus. Bony fragments removed from maxillary sinus.Cavity irrigated. Template of defect made and used to fashion Medpor orbital floor Barrier sheet. This was placed over defect. Scleral shield removed and forced duction test completed with no evidence entrapment. Implant secured to inferior orbital rim with 95mm screw. Scleral shield removed and eye irrigated  with basic salt solution. Tobradex ophthalmic placed in left eye. Frost stitch placed in lower eyelid lateral to lateral limbus and secured to forehead with steri strips.   The patient was allowed to wakefromanesthesia, extubatedand taken to the recovery room in satisfactory condition. Gross vision tested immediately upon arrival to PACU intact.  SPECIMENS: none  DRAINS: none  Irene Limbo, MD [facility] Plastic & Reconstructive Surgery

## 2019-05-11 NOTE — Anesthesia Preprocedure Evaluation (Signed)
Anesthesia Evaluation  Patient identified by MRN, date of birth, ID band Patient awake    Reviewed: Allergy & Precautions, NPO status , Patient's Chart, lab work & pertinent test results  Airway Mallampati: I  TM Distance: >3 FB Neck ROM: Full    Dental   Pulmonary    Pulmonary exam normal        Cardiovascular Normal cardiovascular exam     Neuro/Psych    GI/Hepatic   Endo/Other    Renal/GU      Musculoskeletal   Abdominal   Peds  Hematology   Anesthesia Other Findings   Reproductive/Obstetrics                             Anesthesia Physical Anesthesia Plan  ASA: II  Anesthesia Plan: General   Post-op Pain Management:    Induction: Intravenous  PONV Risk Score and Plan: 3  Airway Management Planned: Oral ETT  Additional Equipment:   Intra-op Plan:   Post-operative Plan: Extubation in OR  Informed Consent: I have reviewed the patients History and Physical, chart, labs and discussed the procedure including the risks, benefits and alternatives for the proposed anesthesia with the patient or authorized representative who has indicated his/her understanding and acceptance.       Plan Discussed with: CRNA and Surgeon  Anesthesia Plan Comments:         Anesthesia Quick Evaluation

## 2019-05-12 ENCOUNTER — Encounter: Payer: Self-pay | Admitting: *Deleted

## 2019-07-08 ENCOUNTER — Encounter: Payer: Self-pay | Admitting: Family Medicine

## 2019-07-16 ENCOUNTER — Ambulatory Visit: Payer: BC Managed Care – PPO | Admitting: Family Medicine

## 2019-07-21 ENCOUNTER — Encounter: Payer: Self-pay | Admitting: Family Medicine

## 2019-07-21 ENCOUNTER — Other Ambulatory Visit: Payer: Self-pay

## 2019-07-21 ENCOUNTER — Ambulatory Visit (INDEPENDENT_AMBULATORY_CARE_PROVIDER_SITE_OTHER): Payer: Self-pay | Admitting: Family Medicine

## 2019-07-21 VITALS — BP 106/60 | HR 82 | Temp 97.3°F | Resp 15 | Ht 60.0 in | Wt 204.4 lb

## 2019-07-21 DIAGNOSIS — Z6841 Body Mass Index (BMI) 40.0 and over, adult: Secondary | ICD-10-CM

## 2019-07-21 DIAGNOSIS — E669 Obesity, unspecified: Secondary | ICD-10-CM | POA: Insufficient documentation

## 2019-07-21 MED ORDER — PHENTERMINE HCL 37.5 MG PO TABS: 37.5000 mg | ORAL_TABLET | Freq: Every day | ORAL | 0 refills | Status: DC

## 2019-07-21 NOTE — Assessment & Plan Note (Signed)
Jacqueline Underwood is re-educated about the importance of exercise daily to help with weight management. A minumum of 30 minutes daily is recommended. Additionally, importance of healthy food choices with portion control discussed.   Starting adipex, will have close follow up. Advised to continue lifestyle changes.  She is aware that the medication is short term and lifestyle changes are the best way to loose weight  Wt Readings from Last 3 Encounters:  07/21/19 204 lb 6.4 oz (92.7 kg)  05/11/19 205 lb 4 oz (93.1 kg)  04/27/19 203 lb 14.8 oz (92.5 kg)

## 2019-07-21 NOTE — Patient Instructions (Addendum)
Happy New Year! May you have a year filled with hope, love, happiness and laughter.  I appreciate the opportunity to provide you with care for your health and wellness. Today we discussed: weight management  Follow up: 4 weeks office for wt and BP check and please change June appt to an annual with no pap  No labs or referrals today  Please take Adipex as directed Keep water intake high  Please continue to practice social distancing to keep you, your family, and our community safe.  If you must go out, please wear a mask and practice good handwashing.  It was a pleasure to see you and I look forward to continuing to work together on your health and well-being. Please do not hesitate to call the office if you need care or have questions about your care.  Have a wonderful day and week. With Gratitude, Cherly Beach, DNP, AGNP-BC

## 2019-07-21 NOTE — Progress Notes (Signed)
Subjective:  Patient ID: Jacqueline Underwood, female    DOB: 05-01-1991  Age: 29 y.o. MRN: [id number]  CC:  Chief Complaint  Patient presents with  . Obesity    wants to discuss weight loss options      HPI  HPI  Jacqueline Underwood is here for follow up regarding weight. Reports is following a low fat heart healthy diet. Reports is  exercising at [facility] being active, but is not in any form of structured exercise.   Reports drinking increased amount of water daily. Reports sleeping well daily. Did loose some weight from June of 2020.   Wt Readings from Last 3 Encounters:  07/21/19 204 lb 6.4 oz (92.7 kg)  05/11/19 205 lb 4 oz (93.1 kg)  04/27/19 203 lb 14.8 oz (92.5 kg)    Today patient denies signs and symptoms of COVID 19 infection including fever, chills, cough, shortness of breath, and headache. Past Medical, Surgical, Social History, Allergies, and Medications have been Reviewed.   Past Medical History:  Diagnosis Date  . Closed fracture of left orbital floor ([place]) 05/11/2019  . H/O cesarean section 01/04/2015   @ 30.4wks w/ twins d/t br/br presentation in active labor   . History of 2019 novel coronavirus disease (COVID-19) 02/08/2019  . Preterm delivery, delivered 01/04/2015   30.4wks d/t spontaneous active labor, twins   . Uterine myoma     Current Meds  Medication Sig  . aspirin-acetaminophen-caffeine (EXCEDRIN MIGRAINE) 250-250-65 MG tablet Take 1 tablet by mouth every 6 (six) hours as needed for headache.    ROS:  Review of Systems  Constitutional: Negative.   HENT: Negative.   Eyes: Negative.   Respiratory: Negative.   Cardiovascular: Negative.   Gastrointestinal: Negative.   Genitourinary: Negative.   Musculoskeletal: Negative.   Skin: Negative.   Neurological: Negative.   Endo/Heme/Allergies: Negative.   Psychiatric/Behavioral: Negative.   All other systems reviewed and are negative.    Objective:   Today's Vitals: BP 106/60   Pulse 82    Temp (!) 97.3 F (36.3 C) (Temporal)   Resp 15   Ht 5' (1.524 m)   Wt 204 lb 6.4 oz (92.7 kg)   SpO2 99%   BMI 39.92 kg/m  Vitals with BMI 07/21/2019 05/11/2019 05/11/2019  Height 5\' 0"  - -  Weight 204 lbs 6 oz - -  BMI [id number] - -  Systolic A999333 - -  Diastolic 60 - -  Pulse 82 73 54     Physical Exam Vitals and nursing note reviewed.  Constitutional:      Appearance: Normal appearance. She is well-developed and well-groomed. She is obese.  HENT:     Head: Normocephalic and atraumatic.     Right Ear: External ear normal.     Left Ear: External ear normal.     Nose: Nose normal.     Mouth/Throat:     Mouth: Mucous membranes are moist.     Pharynx: Oropharynx is clear.  Eyes:     General:        Right eye: No discharge.        Left eye: No discharge.     Conjunctiva/sclera: Conjunctivae normal.  Cardiovascular:     Rate and Rhythm: Normal rate and regular rhythm.     Pulses: Normal pulses.     Heart sounds: Normal heart sounds.  Pulmonary:     Effort: Pulmonary effort is normal.     Breath sounds: Normal breath sounds.  Musculoskeletal:        General: Normal range of motion.     Cervical back: Normal range of motion and neck supple.  Skin:    General: Skin is warm.  Neurological:     General: No focal deficit present.     Mental Status: She is alert and oriented to person, place, and time.  Psychiatric:        Attention and Perception: Attention normal.        Mood and Affect: Mood normal.        Speech: Speech normal.        Behavior: Behavior normal. Behavior is cooperative.        Thought Content: Thought content normal.        Cognition and Memory: Cognition normal.        Judgment: Judgment normal.     Assessment   1. Class 3 severe obesity due to excess calories without serious comorbidity with body mass index (BMI) of 40.0 to 44.9 in adult [facility])     Tests ordered No orders of the defined types were placed in this encounter.    Plan: Please see  assessment and plan per problem list above.   Meds ordered this encounter  Medications  . phentermine (ADIPEX-P) 37.5 MG tablet    Sig: Take 1 tablet (37.5 mg total) by mouth daily before breakfast.    Dispense:  30 tablet    Refill:  0    Order Specific Question:   Supervising Provider    Answer:   Jacklynn Bue    Patient to follow-up in 08/18/2019   Perlie Mayo, NP

## 2019-08-18 ENCOUNTER — Ambulatory Visit: Payer: Self-pay | Admitting: Family Medicine

## 2019-09-03 ENCOUNTER — Encounter: Payer: Self-pay | Admitting: Family Medicine

## 2019-09-03 ENCOUNTER — Other Ambulatory Visit: Payer: Self-pay

## 2019-09-03 ENCOUNTER — Ambulatory Visit (INDEPENDENT_AMBULATORY_CARE_PROVIDER_SITE_OTHER): Payer: Self-pay | Admitting: Family Medicine

## 2019-09-03 DIAGNOSIS — Z6841 Body Mass Index (BMI) 40.0 and over, adult: Secondary | ICD-10-CM

## 2019-09-03 MED ORDER — PHENTERMINE HCL 37.5 MG PO TABS: 37.5000 mg | ORAL_TABLET | Freq: Every day | ORAL | 0 refills | Status: DC

## 2019-09-03 NOTE — Patient Instructions (Signed)
I appreciate the opportunity to provide you with care for your health and wellness. Today we discussed: weight management   Follow up: 4 weeks  No labs or referrals today  Congrats on weight loss! KEEP GOING!  Continue Adipex daily Continue great job on diet and lifestyle changes :)  Happy Spring :)  Please continue to practice social distancing to keep you, your family, and our community safe.  If you must go out, please wear a mask and practice good handwashing.  It was a pleasure to see you and I look forward to continuing to work together on your health and well-being. Please do not hesitate to call the office if you need care or have questions about your care.  Have a wonderful day and week. With Gratitude, Cherly Beach, DNP, AGNP-BC   Exercising to Lose Weight Exercise is structured, repetitive physical activity to improve fitness and health. Getting regular exercise is important for everyone. It is especially important if you are overweight. Being overweight increases your risk of heart disease, stroke, diabetes, high blood pressure, and several types of cancer. Reducing your calorie intake and exercising can help you lose weight. Exercise is usually categorized as moderate or vigorous intensity. To lose weight, most people need to do a certain amount of moderate-intensity or vigorous-intensity exercise each week. Moderate-intensity exercise  Moderate-intensity exercise is any activity that gets you moving enough to burn at least three times more energy (calories) than if you were sitting. Examples of moderate exercise include:  Walking a mile in 15 minutes.  Doing light yard work.  Biking at an easy pace. Most people should get at least 150 minutes (2 hours and 30 minutes) a week of moderate-intensity exercise to maintain their body weight. Vigorous-intensity exercise Vigorous-intensity exercise is any activity that gets you moving enough to burn at least six times more  calories than if you were sitting. When you exercise at this intensity, you should be working hard enough that you are not able to carry on a conversation. Examples of vigorous exercise include:  Running.  Playing a team sport, such as football, basketball, and soccer.  Jumping rope. Most people should get at least 75 minutes (1 hour and 15 minutes) a week of vigorous-intensity exercise to maintain their body weight. How can exercise affect me? When you exercise enough to burn more calories than you eat, you lose weight. Exercise also reduces body fat and builds muscle. The more muscle you have, the more calories you burn. Exercise also:  Improves mood.  Reduces stress and tension.  Improves your overall fitness, flexibility, and endurance.  Increases bone strength. The amount of exercise you need to lose weight depends on:  Your age.  The type of exercise.  Any health conditions you have.  Your overall physical ability. Talk to your health care provider about how much exercise you need and what types of activities are safe for you. What actions can I take to lose weight? Nutrition   Make changes to your diet as told by your health care provider or diet and nutrition specialist (dietitian). This may include: ? Eating fewer calories. ? Eating more protein. ? Eating less unhealthy fats. ? Eating a diet that includes fresh fruits and vegetables, whole grains, low-fat dairy products, and lean protein. ? Avoiding foods with added fat, salt, and sugar.  Drink plenty of water while you exercise to prevent dehydration or heat stroke. Activity  Choose an activity that you enjoy and set realistic goals. Your  health care provider can help you make an exercise plan that works for you.  Exercise at a moderate or vigorous intensity most days of the week. ? The intensity of exercise may vary from person to person. You can tell how intense a workout is for you by paying attention to  your breathing and heartbeat. Most people will notice their breathing and heartbeat get faster with more intense exercise.  Do resistance training twice each week, such as: ? Push-ups. ? Sit-ups. ? Lifting weights. ? Using resistance bands.  Getting short amounts of exercise can be just as helpful as long structured periods of exercise. If you have trouble finding time to exercise, try to include exercise in your daily routine. ? Get up, stretch, and walk around every 30 minutes throughout the day. ? Go for a walk during your lunch break. ? Park your car farther away from your destination. ? If you take public transportation, get off one stop early and walk the rest of the way. ? Make phone calls while standing up and walking around. ? Take the stairs instead of elevators or escalators.  Wear comfortable clothes and shoes with good support.  Do not exercise so much that you hurt yourself, feel dizzy, or get very short of breath. Where to find more information  U.S. Department of Health and Human Services: [employer]at  Centers for Disease Control and Prevention (CDC): http://www.wolf.info/ Contact a health care provider:  Before starting a new exercise program.  If you have questions or concerns about your weight.  If you have a medical problem that keeps you from exercising. Get help right away if you have any of the following while exercising:  Injury.  Dizziness.  Difficulty breathing or shortness of breath that does not go away when you stop exercising.  Chest pain.  Rapid heartbeat. Summary  Being overweight increases your risk of heart disease, stroke, diabetes, high blood pressure, and several types of cancer.  Losing weight happens when you burn more calories than you eat.  Reducing the amount of calories you eat in addition to getting regular moderate or vigorous exercise each week helps you lose weight. This information is not intended to replace advice given to you by  your health care provider. Make sure you discuss any questions you have with your health care provider. Document Revised: 05/27/2017 Document Reviewed: 05/27/2017 Elsevier Patient Education  2020 [employer].

## 2019-09-03 NOTE — Assessment & Plan Note (Signed)
Improved-continue another month of Adipex with follow-up. Jacqueline Underwood is re-educated about the importance of exercise daily to help with weight management. A minumum of 30 minutes daily is recommended. Additionally, importance of healthy food choices  with portion control discussed.  Wt Readings from Last 3 Encounters:  09/03/19 198 lb (89.8 kg)  07/21/19 204 lb 6.4 oz (92.7 kg)  05/11/19 205 lb 4 oz (93.1 kg)

## 2019-09-03 NOTE — Progress Notes (Signed)
Subjective:  Patient ID: Jacqueline Underwood, female    DOB: [birth year]  Age: 29 y.o. MRN: [id number]  CC:  Chief Complaint  Patient presents with  . Obesity    follow up weight and bp check       HPI  HPI  [employer] is here for follow up regarding weight. Reports is following a low fat heart healthy diet, stopped eating pork products, increased grilled chicken and salmon.Reports is  exercising at [facility] being active, but is not in any form of structured exercise-maybe 15 mins of walking daily.  Reports drinking increased amount of water daily, about 4 cups daily, still drinking sweet tea at meals. Reports sleeping well daily.  Has demonstrated weight loss in last month on adipex. Would like to continue for next month.   Wt Readings from Last 3 Encounters:  09/03/19 198 lb (89.8 kg)  07/21/19 204 lb 6.4 oz (92.7 kg)  05/11/19 205 lb 4 oz (93.1 kg)     Today patient denies signs and symptoms of COVID 19 infection including fever, chills, cough, shortness of breath, and headache. Past Medical, Surgical, Social History, Allergies, and Medications have been Reviewed.   Past Medical History:  Diagnosis Date  . Closed fracture of left orbital floor ([place]) 05/11/2019  . H/O cesarean section 01/04/2015   @ 30.4wks w/ twins d/t br/br presentation in active labor   . History of 2019 novel coronavirus disease (COVID-19) 02/08/2019  . Preterm delivery, delivered 01/04/2015   30.4wks d/t spontaneous active labor, twins   . Uterine myoma     Current Meds  Medication Sig  . aspirin-acetaminophen-caffeine (EXCEDRIN MIGRAINE) 250-250-65 MG tablet Take 1 tablet by mouth every 6 (six) hours as needed for headache.  . phentermine (ADIPEX-P) 37.5 MG tablet Take 1 tablet (37.5 mg total) by mouth daily before breakfast.  . [DISCONTINUED] phentermine (ADIPEX-P) 37.5 MG tablet Take 1 tablet (37.5 mg total) by mouth daily before breakfast.    ROS:  Review of Systems  Constitutional:  Negative.   HENT: Negative.   Eyes: Negative.   Respiratory: Negative.   Cardiovascular: Negative.   Gastrointestinal: Negative.   Genitourinary: Negative.   Musculoskeletal: Negative.   Skin: Negative.   Neurological: Negative.   Endo/Heme/Allergies: Negative.   Psychiatric/Behavioral: Negative.   All other systems reviewed and are negative.    Objective:   Today's Vitals: BP 98/66   Pulse 74   Temp 97.6 F (36.4 C) (Temporal)   Resp 16   Ht 5' (1.524 m)   Wt 198 lb (89.8 kg)   SpO2 98%   BMI 38.67 kg/m  Vitals with BMI 09/03/2019 07/21/2019 05/11/2019  Height 5\' 0"  5\' 0"  -  Weight 198 lbs 204 lbs 6 oz -  BMI 0000000 [id number] -  Systolic 98 A999333 -  Diastolic 66 60 -  Pulse 74 82 73     Physical Exam Vitals and nursing note reviewed.  Constitutional:      Appearance: Normal appearance. She is well-developed and well-groomed. She is obese.  HENT:     Head: Normocephalic and atraumatic.     Right Ear: External ear normal.     Left Ear: External ear normal.     Mouth/Throat:     Comments: Mask in place  Eyes:     General:        Right eye: No discharge.        Left eye: No discharge.     Conjunctiva/sclera: Conjunctivae normal.  Cardiovascular:     Rate and Rhythm: Normal rate and regular rhythm.     Pulses: Normal pulses.     Heart sounds: Normal heart sounds.  Pulmonary:     Effort: Pulmonary effort is normal.     Breath sounds: Normal breath sounds.  Musculoskeletal:        General: Normal range of motion.     Cervical back: Normal range of motion and neck supple.  Skin:    General: Skin is warm.  Neurological:     General: No focal deficit present.     Mental Status: She is alert and oriented to person, place, and time.  Psychiatric:        Attention and Perception: Attention normal.        Mood and Affect: Mood normal.        Speech: Speech normal.        Behavior: Behavior normal. Behavior is cooperative.        Thought Content: Thought content  normal.        Cognition and Memory: Cognition normal.        Judgment: Judgment normal.    Assessment   1. Class 3 severe obesity due to excess calories without serious comorbidity with body mass index (BMI) of 40.0 to 44.9 in adult [facility])   2. Obesity with body mass index (BMI) of 35.0 to 39.9 without comorbidity     Tests ordered No orders of the defined types were placed in this encounter.    Plan: Please see assessment and plan per problem list above.   Meds ordered this encounter  Medications  . phentermine (ADIPEX-P) 37.5 MG tablet    Sig: Take 1 tablet (37.5 mg total) by mouth daily before breakfast.    Dispense:  30 tablet    Refill:  0    Order Specific Question:   Supervising Provider    Answer:   Fayrene Helper [id number]    Patient to follow-up in 4 weeks  Perlie Mayo, NP

## 2019-10-01 ENCOUNTER — Ambulatory Visit: Payer: Self-pay | Admitting: Family Medicine

## 2019-11-11 ENCOUNTER — Ambulatory Visit: Payer: Self-pay | Admitting: Family Medicine

## 2019-11-11 ENCOUNTER — Ambulatory Visit: Payer: BC Managed Care – PPO | Admitting: Family Medicine

## 2020-01-01 ENCOUNTER — Ambulatory Visit (INDEPENDENT_AMBULATORY_CARE_PROVIDER_SITE_OTHER): Payer: Self-pay | Admitting: Family Medicine

## 2020-01-01 ENCOUNTER — Other Ambulatory Visit: Payer: Self-pay

## 2020-01-01 ENCOUNTER — Encounter: Payer: Self-pay | Admitting: Family Medicine

## 2020-01-01 VITALS — BP 94/65 | HR 67 | Temp 97.2°F | Resp 18 | Ht 60.0 in | Wt 204.1 lb

## 2020-01-01 DIAGNOSIS — Z0001 Encounter for general adult medical examination with abnormal findings: Secondary | ICD-10-CM | POA: Diagnosis not present

## 2020-01-01 DIAGNOSIS — E669 Obesity, unspecified: Secondary | ICD-10-CM

## 2020-01-01 MED ORDER — PHENTERMINE HCL 37.5 MG PO TABS: 37.5000 mg | ORAL_TABLET | Freq: Every day | ORAL | 0 refills | Status: DC

## 2020-01-01 NOTE — Assessment & Plan Note (Signed)
°  Discussed monthly self breast exams and yearly mammograms-as needed or starting at 40; at least 30 minutes of aerobic activity at least 5 days/week and weight-bearing exercise 2x/week; proper sunscreen use reviewed; healthy diet, including goals of calcium and vitamin D intake and alcohol recommendations (less than or equal to 1 drink/day) reviewed; regular seatbelt use; changing batteries in smoke detectors. Immunization recommendations discussed.

## 2020-01-01 NOTE — Patient Instructions (Signed)
I appreciate the opportunity to provide you with care for your health and wellness. Today we discussed: annual visit   Follow up: 1 year for annual  Labs-fasting today (if you have not had breakfast, or next week when you are fasting) at [place]. No referrals today  Take care, please be safe, and consider the vaccine. See you in a year!  Hope football season is fun for y'all.  Please continue to practice social distancing to keep you, your family, and our community safe.  If you must go out, please wear a mask and practice good handwashing.  It was a pleasure to see you and I look forward to continuing to work together on your health and well-being. Please do not hesitate to call the office if you need care or have questions about your care.  Have a wonderful day and week. With Gratitude, Cherly Beach, DNP, AGNP-BC

## 2020-01-01 NOTE — Assessment & Plan Note (Signed)
Deteriorated, will restart Adipex.   Jacqueline Underwood is educated about the importance of exercise daily to help with weight management. A minumum of 30 minutes daily is recommended. Additionally, importance of healthy food choices  with portion control discussed. Wt Readings from Last 3 Encounters:  01/01/20 204 lb 1.9 oz (92.6 kg)  09/03/19 198 lb (89.8 kg)  07/21/19 204 lb 6.4 oz (92.7 kg)

## 2020-01-01 NOTE — Progress Notes (Signed)
Health Maintenance reviewed -   Immunization History  Administered Date(s) Administered  . MMR 01/17/2013  . Tdap 01/16/2013, 11/20/2014   Last Pap smear: 03/2021 Last mammogram: n/a Last colonoscopy: n/a Last DEXA: n/a Dentist: yearly Ophtho: not yet, might need glasses, Left eye changes Exercise: walking a lot of work Smoker: no  Alcohol Use: no   Other doctors caring for patient include:  Patient Care Team: Perlie Mayo, NP as PCP - General (Family Medicine)  End of Life Discussion:  Patient does not have a living will and medical power of attorney  Subjective:   HPI  Jacqueline Underwood is a 29 y.o. female who presents for annual wellness visit and follow-up on chronic medical conditions.    Denies having any sleep issues.  Denies having any changes in dentition, chewing swallowing.  Sees a dentist regularly.  Denies have any changes in bowel or bladder habits.  Denies having or stool.  Denies having any confusion or memory changes.  Denies having any falls or injuries.  Denies having any skin issues.  Denies having any hearing changes.  Does report some left eye changes and that was evidence today as a vision check.  Encouraged her to go to the eye doctor to have these assessed.  She does not have any changes in breast tissue lumps or bumps that would warrant ultrasound or mammogram.  She has not had her Covid vaccine at this time yet.  When asked if there are any barriers or information that could be provided she declined.  She has been putting on some weight since we last saw each other.  She was on Adipex at that last visit.  Reports that it helps some.  Is willing to try again.  Is fasting this morning for lab check.  Review Of Systems  Review of Systems  HENT: Negative.   Eyes: Negative.   Respiratory: Negative.   Cardiovascular: Negative.   Gastrointestinal: Negative.   Endocrine: Negative.   Genitourinary: Negative.   Musculoskeletal: Negative.     Skin: Negative.   Allergic/Immunologic: Negative.   Neurological: Negative.   Hematological: Negative.   Psychiatric/Behavioral: Negative.   All other systems reviewed and are negative.   Objective:   PHYSICAL EXAM:  BP 94/65 (BP Location: Right Arm, Patient Position: Sitting, Cuff Size: Normal)   Pulse 67   Temp (!) 97.2 F (36.2 C) (Temporal)   Resp 18   Ht 5' (1.524 m)   Wt 204 lb 1.9 oz (92.6 kg)   SpO2 97%   BMI 39.86 kg/m    Physical Exam Vitals and nursing note reviewed.  Constitutional:      Appearance: Normal appearance. She is well-developed and well-groomed. She is obese.  HENT:     Head: Normocephalic.     Right Ear: Hearing, tympanic membrane, ear canal and external ear normal.     Left Ear: Hearing, tympanic membrane, ear canal and external ear normal.     Nose: Nose normal.     Mouth/Throat:     Lips: Pink.     Mouth: Mucous membranes are moist.     Pharynx: Oropharynx is clear.  Eyes:     General: Lids are normal.     Extraocular Movements: Extraocular movements intact.     Conjunctiva/sclera: Conjunctivae normal.     Pupils: Pupils are equal, round, and reactive to light.  Neck:     Thyroid: No thyroid mass, thyromegaly or thyroid tenderness.  Cardiovascular:  Rate and Rhythm: Normal rate and regular rhythm.     Pulses: Normal pulses.          Radial pulses are 2+ on the right side and 2+ on the left side.       Dorsalis pedis pulses are 2+ on the right side and 2+ on the left side.     Heart sounds: Normal heart sounds.  Pulmonary:     Effort: Pulmonary effort is normal.     Breath sounds: Normal breath sounds and air entry.  Abdominal:     General: Abdomen is flat. Bowel sounds are normal.     Palpations: Abdomen is soft.     Tenderness: There is no abdominal tenderness. There is no right CVA tenderness or left CVA tenderness.  Musculoskeletal:        General: Normal range of motion.     Cervical back: Full passive range of motion  without pain, normal range of motion and neck supple.     Right lower leg: No edema.     Left lower leg: No edema.     Comments: MAE, ROM intact  Lymphadenopathy:     Cervical: No cervical adenopathy.  Skin:    General: Skin is warm and dry.     Capillary Refill: Capillary refill takes less than 2 seconds.  Neurological:     General: No focal deficit present.     Mental Status: She is alert and oriented to person, place, and time. Mental status is at baseline.     Cranial Nerves: Cranial nerves are intact.     Sensory: Sensation is intact.     Motor: Motor function is intact.     Coordination: Coordination is intact.     Gait: Gait is intact.     Deep Tendon Reflexes: Reflexes are normal and symmetric.  Psychiatric:        Attention and Perception: Attention and perception normal.        Mood and Affect: Mood and affect normal.        Speech: Speech normal.        Behavior: Behavior normal. Behavior is cooperative.        Thought Content: Thought content normal.        Cognition and Memory: Cognition normal.        Judgment: Judgment normal.     Comments: Good conversation, good eye contact      Depression Screening  Depression screen [facility] 2/9 01/01/2020 07/21/2019 11/11/2018 10/24/2018 04/22/2018  Decreased Interest 0 0 0 0 0  Down, Depressed, Hopeless 0 0 0 0 0  PHQ - 2 Score 0 0 0 0 0     Falls  Fall Risk  01/01/2020 07/21/2019 11/11/2018 06/26/2018 04/22/2018  Falls in the past year? 0 0 0 0 0  Number falls in past yr: 0 0 - - 0  Injury with Fall? 0 0 0 - 0  Risk for fall due to : No Fall Risks - - - -  Follow up Falls evaluation completed - - - -    Assessment & Plan:   1. Annual visit for general adult medical examination with abnormal findings   2. Obesity with body mass index (BMI) of 35.0 to 39.9 without comorbidity     Tests ordered Orders Placed This Encounter  Procedures  . CBC  . Comprehensive metabolic panel  . Hemoglobin A1c  . Lipid panel  . TSH      Plan: Please see assessment and  plan per problem list above.   Meds ordered this encounter  Medications  . phentermine (ADIPEX-P) 37.5 MG tablet    Sig: Take 1 tablet (37.5 mg total) by mouth daily before breakfast.    Dispense:  30 tablet    Refill:  0    Order Specific Question:   Supervising Provider    Answer:   Fayrene Helper [[id number]]     I have personally reviewed: The patient's medical and social history Their use of alcohol, tobacco or illicit drugs Their current medications and supplements The patient's functional ability including ADLs,fall risks, home safety risks, cognitive, and hearing and visual impairment Diet and physical activities Evidence for depression or mood disorders  The patient's weight, height, BMI, and visual acuity have been recorded in the chart.  I have made referrals, counseling, and provided education to the patient based on review of the above and I have provided the patient with a written personalized care plan for preventive services.    Note: This dictation was prepared with Dragon dictation along with smaller phrase technology. Similar sounding words can be transcribed inadequately or may not be corrected upon review. Any transcriptional errors that result from this process are unintentional.      Perlie Mayo, NP   01/01/2020

## 2020-01-02 LAB — COMPREHENSIVE METABOLIC PANEL
ALT: 12 IU/L (ref 0–32)
AST: 17 IU/L (ref 0–40)
Albumin/Globulin Ratio: 1.3 (ref 1.2–2.2)
Albumin: 4.1 g/dL (ref 3.9–5.0)
Alkaline Phosphatase: 62 IU/L (ref 48–121)
BUN/Creatinine Ratio: 16 (ref 9–23)
BUN: 14 mg/dL (ref 6–20)
Bilirubin Total: 0.5 mg/dL (ref 0.0–1.2)
CO2: 24 mmol/L (ref 20–29)
Calcium: 9.6 mg/dL (ref 8.7–10.2)
Chloride: 104 mmol/L (ref 96–106)
Creatinine, Ser: 0.86 mg/dL (ref 0.57–1.00)
GFR calc Af Amer: 106 mL/min/{1.73_m2} (ref >59)
GFR calc non Af Amer: 92 mL/min/{1.73_m2} (ref >59)
Globulin, Total: 3.2 g/dL (ref 1.5–4.5)
Glucose: 84 mg/dL (ref 65–99)
Potassium: 5 mmol/L (ref 3.5–5.2)
Sodium: 139 mmol/L (ref 134–144)
Total Protein: 7.3 g/dL (ref 6.0–8.5)

## 2020-01-02 LAB — TSH: TSH: 0.978 u[IU]/mL (ref 0.450–4.500)

## 2020-01-02 LAB — CBC
Hematocrit: 36.1 % (ref 34.0–46.6)
Hemoglobin: 12.4 g/dL (ref 11.1–15.9)
MCH: 31.6 pg (ref 26.6–33.0)
MCHC: 34.3 g/dL (ref 31.5–35.7)
MCV: 92 fL (ref 79–97)
Platelets: 287 10*3/uL (ref 150–450)
RBC: 3.93 x10E6/uL (ref 3.77–5.28)
RDW: 10.8 % — ABNORMAL LOW (ref 11.7–15.4)
WBC: 6.3 10*3/uL (ref 3.4–10.8)

## 2020-01-02 LAB — LIPID PANEL
Chol/HDL Ratio: 2.4 ratio (ref 0.0–4.4)
Cholesterol, Total: 186 mg/dL (ref 100–199)
HDL: 77 mg/dL (ref >39)
LDL Chol Calc (NIH): 100 mg/dL — ABNORMAL HIGH (ref 0–99)
Triglycerides: 45 mg/dL (ref 0–149)
VLDL Cholesterol Cal: 9 mg/dL (ref 5–40)

## 2020-01-02 LAB — HEMOGLOBIN A1C
Est. average glucose Bld gHb Est-mCnc: 88 mg/dL
Hgb A1c MFr Bld: 4.7 % — ABNORMAL LOW (ref 4.8–5.6)

## 2020-01-18 ENCOUNTER — Ambulatory Visit
Admission: EM | Admit: 2020-01-18 | Discharge: 2020-01-18 | Disposition: A | Discharge status: Home / Self Care | Payer: Self-pay | Attending: Emergency Medicine | Admitting: Emergency Medicine

## 2020-01-18 ENCOUNTER — Other Ambulatory Visit: Payer: Self-pay

## 2020-01-18 DIAGNOSIS — G44319 Acute post-traumatic headache, not intractable: Secondary | ICD-10-CM

## 2020-01-18 MED ORDER — CYCLOBENZAPRINE HCL 5 MG PO TABS: 5.0000 mg | ORAL_TABLET | Freq: Three times a day (TID) | ORAL | 0 refills | Status: DC | PRN

## 2020-01-18 MED ORDER — NAPROXEN 500 MG PO TABS: 500.0000 mg | ORAL_TABLET | Freq: Two times a day (BID) | ORAL | 0 refills | Status: DC
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
## 2020-01-18 NOTE — Discharge Instructions (Addendum)
Rest, ice and heat as needed Ensure adequate range of motion as tolerated. Injuries all appear to be muscular in nature at this time Prescribed naproxen as needed for inflammation and pain relief.  DO NOT TAKE WITH OTHER antiinflammatories, as this may cause GI upset and/or bleed Prescribed flexeril as needed at bedtime for muscle spasm.  Do not drive or operate heavy machinery while taking this medication Expect some increased pain in the next 1-3 days.  It may take 3-4 weeks for complete resolution of symptoms Will f/u with her doctor or here if not seeing significant improvement within one week. Return here or go to ER if you have any new or worsening symptoms such as numbness/tingling of the inner thighs, loss of bladder or bowel control, headache/blurry vision, nausea/vomiting, confusion/altered mental status, dizziness, weakness, passing out, imbalance, etc..Marland Kitchen
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
## 2020-05-27 ENCOUNTER — Emergency Department (HOSPITAL_COMMUNITY)
Admission: EM | Admit: 2020-05-27 | Discharge: 2020-05-27 | Disposition: A | Discharge status: Home / Self Care | Payer: BLUE CROSS/BLUE SHIELD | Attending: Emergency Medicine | Admitting: Emergency Medicine

## 2020-05-27 ENCOUNTER — Other Ambulatory Visit: Payer: Self-pay

## 2020-05-27 ENCOUNTER — Encounter (HOSPITAL_COMMUNITY): Payer: Self-pay | Admitting: Emergency Medicine

## 2020-05-27 DIAGNOSIS — R112 Nausea with vomiting, unspecified: Secondary | ICD-10-CM | POA: Insufficient documentation

## 2020-05-27 DIAGNOSIS — M791 Myalgia, unspecified site: Secondary | ICD-10-CM | POA: Insufficient documentation

## 2020-05-27 DIAGNOSIS — R0981 Nasal congestion: Secondary | ICD-10-CM | POA: Diagnosis not present

## 2020-05-27 DIAGNOSIS — R059 Cough, unspecified: Secondary | ICD-10-CM | POA: Insufficient documentation

## 2020-05-27 DIAGNOSIS — Z91010 Allergy to peanuts: Secondary | ICD-10-CM | POA: Diagnosis not present

## 2020-05-27 DIAGNOSIS — R509 Fever, unspecified: Secondary | ICD-10-CM | POA: Insufficient documentation

## 2020-05-27 DIAGNOSIS — R197 Diarrhea, unspecified: Secondary | ICD-10-CM | POA: Diagnosis not present

## 2020-05-27 DIAGNOSIS — Z20822 Contact with and (suspected) exposure to covid-19: Secondary | ICD-10-CM | POA: Diagnosis not present

## 2020-05-27 DIAGNOSIS — Z7982 Long term (current) use of aspirin: Secondary | ICD-10-CM | POA: Diagnosis not present

## 2020-05-27 LAB — RESP PANEL BY RT-PCR (FLU A&B, COVID) ARPGX2
Influenza A by PCR: NEGATIVE
Influenza B by PCR: NEGATIVE
SARS Coronavirus 2 by RT PCR: NEGATIVE

## 2020-05-27 NOTE — ED Triage Notes (Signed)
Cough , body aches and vomiting that started yesterday. Has been in contact with covid positive patient.

## 2020-05-27 NOTE — ED Provider Notes (Signed)
[facility] EMERGENCY DEPARTMENT Provider Note   CSN: [id number] Arrival date & time: 05/27/20  1204     History Chief Complaint  Patient presents with  . Cough    Jacqueline Underwood is a 29 y.o. female.  The history is provided by the patient. No language interpreter was used.  Cough    29 year old female with history of obesity presenting with Covid symptoms.  Patient report for the past day or 2 she has had symptoms including subjective fever, mild body aches, some nausea vomiting diarrhea, and some congestion.  She was recently exposed to someone else that test positive COVID-19 several days prior.  She denies any significant shortness of breath no loss of taste or smell no chest pain or neck stiffness.  She did take TheraFlu at home with some improvement.  She has not been vaccinated for COVID-19.  Past Medical History:  Diagnosis Date  . Closed fracture of left orbital floor (HCC) 05/11/2019  . Dysmenorrhea 03/25/2017  . Encounter for sterilization 01/25/2015   11/04/2018 bilateral salpingectomy  . Fibroids 10/21/2012   05/30/12: 4.5cm     2/18: 5cm       06/15/14 @ 8wks: uterine pedunculated fibroid: 9.3x6.6x6.5    2/17 @ 12.3wks: 10.4x6.7    6/8 @ 28.3wks: 10.7x7.3x6.56cm   . H/O cesarean section 01/04/2015   @ 30.4wks w/ twins d/t br/br presentation in active labor   . History of 2019 novel coronavirus disease (COVID-19) 02/08/2019  . Menometrorrhagia 03/25/2017  . [place] 03/25/2017  . Preterm delivery, delivered 01/04/2015   30.4wks d/t spontaneous active labor, twins   . S/P myomectomy 04/24/2017   Small adhesiolysis p c/s done at tubal.  . Uterine myoma     Patient Active Problem List   Diagnosis Date Noted  . Annual visit for general adult medical examination with abnormal findings 01/01/2020  . Obesity with body mass index (BMI) of 35.0 to 39.9 without comorbidity 07/21/2019    Past Surgical History:  Procedure Laterality Date  . CESAREAN SECTION MULTI-GESTATIONAL   11/18/2014   Procedure: CESAREAN SECTION MULTI-GESTATIONAL;  Surgeon: Reva Bores, MD;  Location: [facility];  Service: Obstetrics;;  . LAPAROSCOPIC BILATERAL SALPINGECTOMY Bilateral 11/04/2018   Procedure: LAPAROSCOPIC BILATERAL SALPINGECTOMY;  Surgeon: Tilda Burrow, MD;  Location: [facility];  Service: Gynecology;  Laterality: Bilateral;  . LAPAROSCOPIC LYSIS OF ADHESIONS N/A 11/04/2018   Procedure: LAPAROSCOPIC LYSIS OF ADHESIONS;  Surgeon: Tilda Burrow, MD;  Location: [facility];  Service: Gynecology;  Laterality: N/A;  . MYOMECTOMY N/A 04/24/2017   Procedure: ABDOMINAL MYOMECTOMY OF UTERUS;  Surgeon: Lazaro Arms, MD;  Location: [facility];  Service: Gynecology;  Laterality: N/A;  . NO PAST SURGERIES    . ORIF ORBITAL FRACTURE Left 05/11/2019   Procedure: OPEN TREATMENT LEFT ORBITAL FLOOR FRACTURE WITH IMPLANT PERIORBITAL APPROACH;  Surgeon: Glenna Fellows, MD;  Location: [facility];  Service: Plastics;  Laterality: Left;  . REMOVAL OF NON VAGINAL CONTRACEPTIVE DEVICE Right 04/24/2017   Procedure: REMOVAL OF NEXPLANON RIGHT ARM;  Surgeon: Lazaro Arms, MD;  Location: [facility];  Service: Gynecology;  Laterality: Right;  . TUBAL LIGATION       OB History    Gravida  3   Para  2   Term  1   Preterm  1   AB  1   Living  3     SAB  1   IAB      Ectopic  Multiple  1   Live Births  3           Family History  Problem Relation Age of Onset  . Hypertension Father   . Diabetes Maternal Grandmother     Social History   Tobacco Use  . Smoking status: Never Smoker  . Smokeless tobacco: Never Used  Vaping Use  . Vaping Use: Never used  Substance Use Topics  . Alcohol use: Yes    Comment: weekends  . Drug use: No    Home Medications Prior to Admission medications   Medication Sig Start Date End Date Taking? Authorizing Provider  aspirin-acetaminophen-caffeine (EXCEDRIN MIGRAINE) [phone] MG tablet Take 1 tablet by mouth every 6 (six) hours as  needed for headache. Patient not taking: Reported on 01/01/2020    [provider]  cyclobenzaprine (FLEXERIL) 5 MG tablet Take 1 tablet (5 mg total) by mouth 3 (three) times daily as needed. 01/18/20   Avegno, Darrelyn Hillock, FNP  naproxen (NAPROSYN) 500 MG tablet Take 1 tablet (500 mg total) by mouth 2 (two) times daily. 01/18/20   Emerson Monte, FNP  phentermine (ADIPEX-P) 37.5 MG tablet Take 1 tablet (37.5 mg total) by mouth daily before breakfast. 01/01/20   Perlie Mayo, NP    Allergies    Peanuts [peanut oil]  Review of Systems   Review of Systems  Respiratory: Positive for cough.   All other systems reviewed and are negative.   Physical Exam Updated Vital Signs BP (!) 110/59 (BP Location: Right Arm)   Pulse 76   Temp 98 F (36.7 C) (Oral)   Resp 20   Ht 5' (1.524 m)   Wt 92.5 kg   LMP 05/11/2020   SpO2 99%   BMI 39.84 kg/m   Physical Exam Vitals and nursing note reviewed.  Constitutional:      General: She is not in acute distress.    Appearance: She is well-developed and well-nourished. She is obese.  HENT:     Head: Atraumatic.  Eyes:     Conjunctiva/sclera: Conjunctivae normal.  Cardiovascular:     Rate and Rhythm: Normal rate and regular rhythm.     Pulses: Normal pulses.     Heart sounds: Normal heart sounds.  Pulmonary:     Effort: Pulmonary effort is normal.     Breath sounds: Normal breath sounds. No wheezing, rhonchi or rales.  Musculoskeletal:     Cervical back: Neck supple.  Skin:    Findings: No rash.  Neurological:     Mental Status: She is alert.  Psychiatric:        Mood and Affect: Mood and affect and mood normal.     ED Results / Procedures / Treatments   Labs (all labs ordered are listed, but only abnormal results are displayed) Labs Reviewed  RESP PANEL BY RT-PCR (FLU A&B, COVID) ARPGX2    EKG None  Radiology No results found.  Procedures Procedures (including critical care time)  Medications Ordered in  ED Medications - No data to display  ED Course  I have reviewed the triage vital signs and the nursing notes.  Pertinent labs & imaging results that were available during my care of the patient were reviewed by me and considered in my medical decision making (see chart for details).    MDM Rules/Calculators/A&P                          BP (!) 110/59 (BP  Location: Right Arm)   Pulse 76   Temp 98 F (36.7 C) (Oral)   Resp 20   Ht 5' (1.524 m)   Wt 92.5 kg   LMP 05/11/2020   SpO2 99%   BMI 39.84 kg/m   Final Clinical Impression(s) / ED Diagnoses Final diagnoses:  Suspected COVID-19 virus infection    Rx / DC Orders ED Discharge Orders    None     Jacqueline Underwood was evaluated in Emergency Department on 05/27/2020 for the symptoms described in the history of present illness. She was evaluated in the context of the global COVID-19 pandemic, which necessitated consideration that the patient might be at risk for infection with the SARS-CoV-2 virus that causes COVID-19. Institutional protocols and algorithms that pertain to the evaluation of patients at risk for COVID-19 are in a state of rapid change based on information released by regulatory bodies including the CDC and federal and state organizations. These policies and algorithms were followed during the patient's care in the ED.    Domenic Moras, PA-C 05/27/20 1241    Hayden Rasmussen, MD 05/27/20 [phone]

## 2020-05-27 NOTE — Discharge Instructions (Signed)
You have been evaluated for potential Covid infection.  Check result through MyChart, and if test positive, follow instruction below.  Recommendations for at home COVID-19 symptoms management:  Please continue isolation at home. Call [phone] to see whether you might be eligible for therapeutic antibody infusions (leave your name and they will call you back).  If have acute worsening of symptoms please go to ER/urgent care for further evaluation. Check pulse oximetry and if below 90-92% please go to ER. The following supplements MAY help:  Vitamin C 500mg  twice a day and Quercetin 250-500 mg twice a day Vitamin D3 2000 - 4000 u/day B Complex vitamins Zinc 75-100 mg/day Melatonin 6-10 mg at night (the optimal dose is unknown) Aspirin 81mg /day (if no history of bleeding issues)

## 2020-05-30 ENCOUNTER — Telehealth: Payer: Self-pay

## 2020-05-30 NOTE — Telephone Encounter (Signed)
Transition Care Management Follow-up Telephone Call  Date of discharge and from where: 05/27/2020  How have you been since you were released from the hospital? Patient states she is feeling good.   Any questions or concerns? No  Items Reviewed:  Did the pt receive and understand the discharge instructions provided? Yes   Medications obtained and verified? Yes   Other? No   Any new allergies since your discharge? No   Dietary orders reviewed? no  Do you have support at home? Yes   Functional Questionnaire: (I = Independent and D = Dependent) ADLs: I  Bathing/Dressing- I  Meal Prep- I  Eating- I  Maintaining continence- I  Transferring/Ambulation- I  Managing Meds- I   Follow up appointments reviewed:   PCP Hospital f/u appt confirmed? No    Are transportation arrangements needed? No   If their condition worsens, is the pt aware to call PCP or go to the Emergency Dept.? Yes  Was the patient provided with contact information for the PCP's office or ED? Yes  Was to pt encouraged to call back with questions or concerns? Yes

## 2020-07-25 ENCOUNTER — Other Ambulatory Visit: Payer: Self-pay | Admitting: Family Medicine

## 2020-07-25 DIAGNOSIS — E669 Obesity, unspecified: Secondary | ICD-10-CM

## 2020-11-27 ENCOUNTER — Ambulatory Visit (INDEPENDENT_AMBULATORY_CARE_PROVIDER_SITE_OTHER): Payer: Medicaid Other

## 2020-11-27 ENCOUNTER — Ambulatory Visit
Admission: EM | Admit: 2020-11-27 | Discharge: 2020-11-27 | Disposition: A | Discharge status: Home / Self Care | Payer: Medicaid Other | Attending: Family Medicine | Admitting: Family Medicine

## 2020-11-27 ENCOUNTER — Other Ambulatory Visit: Payer: Self-pay

## 2020-11-27 DIAGNOSIS — M79642 Pain in left hand: Secondary | ICD-10-CM

## 2020-11-27 MED ORDER — NAPROXEN 375 MG PO TABS: 375.0000 mg | ORAL_TABLET | Freq: Two times a day (BID) | ORAL | 0 refills | Status: DC

## 2020-11-27 NOTE — ED Triage Notes (Signed)
Patient presents to Urgent Care with complaints of a left hand injury from a fall x 2 weeks ago. She reports pain has not resolved and increases with ROM.

## 2020-11-27 NOTE — ED Provider Notes (Signed)
[facility]    CSN: [id number] Arrival date & time: 11/27/20  1312      History   Chief Complaint Chief Complaint  Patient presents with   Hand Injury    HPI Jacqueline Underwood is a 30 y.o. female.   HPI Patient in for evaluation of left hand injury she sustained when she fell and landed with her hand outstretched palmar surface of left hand x 2 weeks ago. She reports the pain has persisted since the fall and she is concerned for possible fracture.  No prior fractures involving the left hand. Past Medical History:  Diagnosis Date   Closed fracture of left orbital floor ([place]) 05/11/2019   Dysmenorrhea 03/25/2017   Encounter for sterilization 01/25/2015   11/04/2018 bilateral salpingectomy   Fibroids 10/21/2012   05/30/12: 4.5cm     2/18: 5cm       06/15/14 @ 8wks: uterine pedunculated fibroid: 9.3x6.6x6.5    2/17 @ 12.3wks: 10.4x6.7    6/8 @ 28.3wks: 10.7x7.3x6.56cm    H/O cesarean section 01/04/2015   @ 30.4wks w/ twins d/t br/br presentation in active labor    History of 2019 novel coronavirus disease (COVID-19) 02/08/2019   Menometrorrhagia 03/25/2017   [place] 03/25/2017   Preterm delivery, delivered 01/04/2015   30.4wks d/t spontaneous active labor, twins    S/P myomectomy 04/24/2017   Small adhesiolysis p c/s done at tubal.   Uterine myoma     Patient Active Problem List   Diagnosis Date Noted   Annual visit for general adult medical examination with abnormal findings 01/01/2020   Obesity with body mass index (BMI) of 35.0 to 39.9 without comorbidity 07/21/2019    Past Surgical History:  Procedure Laterality Date   CESAREAN SECTION MULTI-GESTATIONAL  11/18/2014   Procedure: CESAREAN SECTION MULTI-GESTATIONAL;  Surgeon: Donnamae Jude, MD;  Location: [facility];  Service: Obstetrics;;   LAPAROSCOPIC BILATERAL SALPINGECTOMY Bilateral 11/04/2018   Procedure: LAPAROSCOPIC BILATERAL SALPINGECTOMY;  Surgeon: Jonnie Kind, MD;  Location: [facility];  Service: Gynecology;   Laterality: Bilateral;   LAPAROSCOPIC LYSIS OF ADHESIONS N/A 11/04/2018   Procedure: LAPAROSCOPIC LYSIS OF ADHESIONS;  Surgeon: Jonnie Kind, MD;  Location: [facility];  Service: Gynecology;  Laterality: N/A;   MYOMECTOMY N/A 04/24/2017   Procedure: ABDOMINAL MYOMECTOMY OF UTERUS;  Surgeon: Florian Buff, MD;  Location: [facility];  Service: Gynecology;  Laterality: N/A;   NO PAST SURGERIES     ORIF ORBITAL FRACTURE Left 05/11/2019   Procedure: OPEN TREATMENT LEFT ORBITAL FLOOR FRACTURE WITH IMPLANT PERIORBITAL APPROACH;  Surgeon: Irene Limbo, MD;  Location: [place];  Service: Plastics;  Laterality: Left;   REMOVAL OF NON VAGINAL CONTRACEPTIVE DEVICE Right 04/24/2017   Procedure: REMOVAL OF [place];  Surgeon: Florian Buff, MD;  Location: [facility];  Service: Gynecology;  Laterality: Right;   TUBAL LIGATION      OB History     Gravida  3   Para  2   Term  1   Preterm  1   AB  1   Living  3      SAB  1   IAB      Ectopic      Multiple  1   Live Births  3            Home Medications    Prior to Admission medications   Medication Sig Start Date End Date Taking? Authorizing Provider  aspirin-acetaminophen-caffeine ([place]) [phone] MG  tablet Take 1 tablet by mouth every 6 (six) hours as needed for headache. Patient not taking: No sig reported    [provider]  cyclobenzaprine (FLEXERIL) 5 MG tablet Take 1 tablet (5 mg total) by mouth 3 (three) times daily as needed. 01/18/20   Avegno, Darrelyn Hillock, FNP  naproxen (NAPROSYN) 500 MG tablet Take 1 tablet (500 mg total) by mouth 2 (two) times daily. 01/18/20   Emerson Monte, FNP  phentermine (ADIPEX-P) 37.5 MG tablet TAKE 1 TABLET(37.5 MG) BY MOUTH DAILY BEFORE BREAKFAST 07/25/20   Noreene Larsson, NP  phentermine (ADIPEX-P) 37.5 MG tablet TAKE 1 TABLET(37.5 MG) BY MOUTH DAILY BEFORE BREAKFAST 07/25/20   Noreene Larsson, NP    Family History Family History  Problem  Relation Age of Onset   Hypertension Father    Diabetes Maternal Grandmother     Social History Social History   Tobacco Use   Smoking status: Never   Smokeless tobacco: Never  Vaping Use   Vaping Use: Never used  Substance Use Topics   Alcohol use: Yes    Comment: weekends   Drug use: No     Allergies   Peanuts [peanut oil]   Review of Systems Review of Systems Pertinent negatives listed in HPI Physical Exam Triage Vital Signs ED Triage Vitals  Enc Vitals Group     BP 11/27/20 1448 94/62     Pulse Rate 11/27/20 1448 76     Resp 11/27/20 1448 16     Temp --      Temp src --      SpO2 11/27/20 1448 98 %     Weight --      Height --      Head Circumference --      Peak Flow --      Pain Score 11/27/20 1447 7     Pain Loc --      Pain Edu? --      Excl. in [place]? --    No data found.  Updated Vital Signs BP 94/62 (BP Location: Right Arm)   Pulse 76   Resp 16   SpO2 98%   Visual Acuity Right Eye Distance:   Left Eye Distance:   Bilateral Distance:    Right Eye Near:   Left Eye Near:    Bilateral Near:     Physical Exam General appearance: Alert, well developed, well nourished, cooperative  Head: Normocephalic, without obvious abnormality, atraumatic Respiratory: Respirations even and unlabored, normal respiratory rate Heart: Rate and rhythm normal. No gallop or murmurs noted on exam  Extremities: Left hand no obvious deformity tenderness at the distal palmar region of hand  Skin: Skin color, texture, turgor normal. No rashes seen  Psych: Appropriate mood and affect. Neurologic: Neurovascular intact UC Treatments / Results  Labs (all labs ordered are listed, but only abnormal results are displayed) Labs Reviewed - No data to display  EKG   Radiology DG Hand Complete Left  Result Date: 11/27/2020 CLINICAL DATA:  Acute LEFT hand pain following injury 2 weeks ago. EXAM: LEFT HAND - COMPLETE 3+ VIEW COMPARISON:  None. FINDINGS: There is no  evidence of fracture or dislocation. There is no evidence of arthropathy or other focal bone abnormality. Soft tissues are unremarkable. IMPRESSION: Negative. Electronically Signed   By: Margarette Canada M.D.   On: 11/27/2020 15:02    Procedures Procedures (including critical care time)  Medications Ordered in UC Medications - No data to display  Initial  Impression / Assessment and Plan / UC Course  I have reviewed the triage vital signs and the nursing notes.  Pertinent labs & imaging results that were available during my care of the patient were reviewed by me and considered in my medical decision making (see chart for details).    Left hand imaging negative for any fracture or misalignment.  Continue conservative treatment with warm salt water soaks along with naproxen twice daily for minimum of 7 days.  If pain persists information given to follow-up with the hand specialist.  Final Clinical Impressions(s) / UC Diagnoses   Final diagnoses:  Hand pain, left     Discharge Instructions      Suspect some bruising below the skin surface relating to your fall is likely the cause of your ongoing pain.  Start naproxen twice daily for the next 7 days along with warm water soaks to relieve inflammation which is causing pain.  If no relief in pain worsens I have included information to follow-up with [facility] as they have a hand specialist on staff.    ED Prescriptions     Medication Sig Dispense Auth. Provider   naproxen (NAPROSYN) 375 MG tablet Take 1 tablet (375 mg total) by mouth 2 (two) times daily. 20 tablet Scot Jun, FNP      PDMP not reviewed this encounter.   Scot Jun, FNP 11/28/20 [phone]

## 2020-11-27 NOTE — Discharge Instructions (Addendum)
Suspect some bruising below the skin surface relating to your fall is likely the cause of your ongoing pain.  Start naproxen twice daily for the next 7 days along with warm water soaks to relieve inflammation which is causing pain.  If no relief in pain worsens I have included information to follow-up with [facility] as they have a hand specialist on staff.

## 2021-01-03 ENCOUNTER — Encounter: Payer: Self-pay | Admitting: Nurse Practitioner

## 2021-01-03 ENCOUNTER — Encounter: Payer: Self-pay | Admitting: Family Medicine

## 2021-01-14 ENCOUNTER — Encounter: Payer: Self-pay | Admitting: Emergency Medicine

## 2021-01-14 ENCOUNTER — Ambulatory Visit
Admission: EM | Admit: 2021-01-14 | Discharge: 2021-01-14 | Disposition: A | Discharge status: Home / Self Care | Payer: 59 | Attending: Internal Medicine | Admitting: Internal Medicine

## 2021-01-14 ENCOUNTER — Ambulatory Visit (INDEPENDENT_AMBULATORY_CARE_PROVIDER_SITE_OTHER): Payer: 59

## 2021-01-14 ENCOUNTER — Other Ambulatory Visit: Payer: Self-pay

## 2021-01-14 DIAGNOSIS — S63602A Unspecified sprain of left thumb, initial encounter: Secondary | ICD-10-CM | POA: Diagnosis not present

## 2021-01-14 DIAGNOSIS — M79645 Pain in left finger(s): Secondary | ICD-10-CM

## 2021-01-14 MED ORDER — IBUPROFEN 600 MG PO TABS: 600.0000 mg | ORAL_TABLET | Freq: Four times a day (QID) | ORAL | 0 refills | Status: DC | PRN

## 2021-01-14 NOTE — Discharge Instructions (Addendum)
Please take your medications as prescribed Icing of the thumb to reduce inflammation Gentle range of motion exercise.
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
## 2021-03-11 ENCOUNTER — Emergency Department (HOSPITAL_COMMUNITY): Payer: 59

## 2021-03-11 ENCOUNTER — Encounter (HOSPITAL_COMMUNITY): Payer: Self-pay | Admitting: Emergency Medicine

## 2021-03-11 ENCOUNTER — Emergency Department (HOSPITAL_COMMUNITY)
Admission: EM | Admit: 2021-03-11 | Discharge: 2021-03-11 | Disposition: A | Discharge status: Home / Self Care | Payer: 59 | Attending: Emergency Medicine | Admitting: Emergency Medicine

## 2021-03-11 ENCOUNTER — Ambulatory Visit
Admission: EM | Admit: 2021-03-11 | Discharge: 2021-03-11 | Disposition: A | Discharge status: Home / Self Care | Payer: 59 | Attending: Urgent Care | Admitting: Urgent Care

## 2021-03-11 ENCOUNTER — Other Ambulatory Visit: Payer: Self-pay

## 2021-03-11 DIAGNOSIS — R1030 Lower abdominal pain, unspecified: Secondary | ICD-10-CM

## 2021-03-11 DIAGNOSIS — R102 Pelvic and perineal pain: Secondary | ICD-10-CM

## 2021-03-11 DIAGNOSIS — R1031 Right lower quadrant pain: Secondary | ICD-10-CM | POA: Diagnosis not present

## 2021-03-11 DIAGNOSIS — D259 Leiomyoma of uterus, unspecified: Secondary | ICD-10-CM

## 2021-03-11 DIAGNOSIS — K449 Diaphragmatic hernia without obstruction or gangrene: Secondary | ICD-10-CM

## 2021-03-11 DIAGNOSIS — Z8616 Personal history of COVID-19: Secondary | ICD-10-CM | POA: Insufficient documentation

## 2021-03-11 DIAGNOSIS — Z91010 Allergy to peanuts: Secondary | ICD-10-CM | POA: Diagnosis not present

## 2021-03-11 DIAGNOSIS — N3289 Other specified disorders of bladder: Secondary | ICD-10-CM | POA: Diagnosis not present

## 2021-03-11 DIAGNOSIS — R1033 Periumbilical pain: Secondary | ICD-10-CM | POA: Insufficient documentation

## 2021-03-11 DIAGNOSIS — R109 Unspecified abdominal pain: Secondary | ICD-10-CM | POA: Diagnosis not present

## 2021-03-11 LAB — COMPREHENSIVE METABOLIC PANEL
ALT: 14 U/L (ref 0–44)
AST: 20 U/L (ref 15–41)
Albumin: 3.8 g/dL (ref 3.5–5.0)
Alkaline Phosphatase: 53 U/L (ref 38–126)
Anion gap: 7 (ref 5–15)
BUN: 18 mg/dL (ref 6–20)
CO2: 24 mmol/L (ref 22–32)
Calcium: 8.9 mg/dL (ref 8.9–10.3)
Chloride: 106 mmol/L (ref 98–111)
Creatinine, Ser: 0.7 mg/dL (ref 0.44–1.00)
GFR, Estimated: >60 mL/min (ref >60)
Glucose, Bld: 98 mg/dL (ref 70–99)
Potassium: 3.6 mmol/L (ref 3.5–5.1)
Sodium: 137 mmol/L (ref 135–145)
Total Bilirubin: 0.2 mg/dL — ABNORMAL LOW (ref 0.3–1.2)
Total Protein: 7.9 g/dL (ref 6.5–8.1)

## 2021-03-11 LAB — CBC
HCT: 35.7 % — ABNORMAL LOW (ref 36.0–46.0)
Hemoglobin: 12.1 g/dL (ref 12.0–15.0)
MCH: 32.2 pg (ref 26.0–34.0)
MCHC: 33.9 g/dL (ref 30.0–36.0)
MCV: 94.9 fL (ref 80.0–100.0)
Platelets: 266 10*3/uL (ref 150–400)
RBC: 3.76 MIL/uL — ABNORMAL LOW (ref 3.87–5.11)
RDW: 11.6 % (ref 11.5–15.5)
WBC: 7 10*3/uL (ref 4.0–10.5)
nRBC: 0 % (ref 0.0–0.2)

## 2021-03-11 LAB — URINALYSIS, ROUTINE W REFLEX MICROSCOPIC
Bacteria, UA: NONE SEEN
Bilirubin Urine: NEGATIVE
Glucose, UA: NEGATIVE mg/dL
Ketones, ur: NEGATIVE mg/dL
Leukocytes,Ua: NEGATIVE
Nitrite: NEGATIVE
Protein, ur: NEGATIVE mg/dL
RBC / HPF: >50 RBC/hpf — ABNORMAL HIGH (ref 0–5)
Specific Gravity, Urine: 1.024 (ref 1.005–1.030)
pH: 6 (ref 5.0–8.0)

## 2021-03-11 LAB — POC URINE PREG, ED: Preg Test, Ur: NEGATIVE

## 2021-03-11 LAB — LIPASE, BLOOD: Lipase: 26 U/L (ref 11–51)

## 2021-03-11 MED ORDER — TIZANIDINE HCL 4 MG PO TABS: 4.0000 mg | ORAL_TABLET | Freq: Every day | ORAL | 0 refills | Status: DC

## 2021-03-11 MED ORDER — NAPROXEN 500 MG PO TABS: 500.0000 mg | ORAL_TABLET | Freq: Two times a day (BID) | ORAL | 0 refills | Status: DC

## 2021-03-11 MED ORDER — FAMOTIDINE 20 MG PO TABS: 20.0000 mg | ORAL_TABLET | Freq: Two times a day (BID) | ORAL | 0 refills | Status: DC

## 2021-03-11 NOTE — ED Triage Notes (Signed)
Pt with c/o RLQ abdominal pain that is worse with ambulation, sneezing, movement.

## 2021-03-11 NOTE — ED Triage Notes (Signed)
Pt reports since Monday she has had abd pain (sharp, medial to RLQ), patient states pain is on and off. Patient does state she had been nauseated. was seen in ED last night.

## 2021-03-11 NOTE — Discharge Instructions (Addendum)
Take ibuprofen 600 mg every 6 hours as needed for pain.  Follow-up with primary doctor if not improving in the next few days, and return to the ER if you develop worsening pain, high fever, bloody stools, or other new and concerning symptoms.
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
## 2021-03-11 NOTE — ED Provider Notes (Signed)
[facility] EMERGENCY DEPARTMENT Provider Note   CSN: [id number] Arrival date & time: 03/11/21  0222     History Chief Complaint  Patient presents with   Abdominal Pain    Dominika LOVIE ZARLING is a 30 y.o. female.  Patient is a 30 year old female with history of tubal ligation.  Patient presenting today with complaints of right lower quadrant and periumbilical pain.  This started earlier today and is worsening.  Pain is worse when she changes position, ambulates, and pushes on the area.  She denies any fevers or chills.  She denies any bowel or bladder complaints.    The history is provided by the patient.  Abdominal Pain Pain location:  RLQ Pain quality: cramping   Pain radiates to:  Does not radiate Pain severity:  Moderate Onset quality:  Gradual Duration:  12 hours Timing:  Constant Chronicity:  New Relieved by:  Nothing Worsened by:  Movement and palpation     Past Medical History:  Diagnosis Date   Closed fracture of left orbital floor ([place]) 05/11/2019   Dysmenorrhea 03/25/2017   Encounter for sterilization 01/25/2015   11/04/2018 bilateral salpingectomy   Fibroids 10/21/2012   05/30/12: 4.5cm     2/18: 5cm       06/15/14 @ 8wks: uterine pedunculated fibroid: 9.3x6.6x6.5    2/17 @ 12.3wks: 10.4x6.7    6/8 @ 28.3wks: 10.7x7.3x6.56cm    H/O cesarean section 01/04/2015   @ 30.4wks w/ twins d/t br/br presentation in active labor    History of 2019 novel coronavirus disease (COVID-19) 02/08/2019   Menometrorrhagia 03/25/2017   Moody 03/25/2017   Preterm delivery, delivered 01/04/2015   30.4wks d/t spontaneous active labor, twins    S/P myomectomy 04/24/2017   Small adhesiolysis p c/s done at tubal.   Uterine myoma     Patient Active Problem List   Diagnosis Date Noted   Annual visit for general adult medical examination with abnormal findings 01/01/2020   Obesity with body mass index (BMI) of 35.0 to 39.9 without comorbidity 07/21/2019    Past Surgical History:  Procedure  Laterality Date   CESAREAN SECTION MULTI-GESTATIONAL  11/18/2014   Procedure: CESAREAN SECTION MULTI-GESTATIONAL;  Surgeon: Donnamae Jude, MD;  Location: [place] [facility];  Service: Obstetrics;;   LAPAROSCOPIC BILATERAL SALPINGECTOMY Bilateral 11/04/2018   Procedure: LAPAROSCOPIC BILATERAL SALPINGECTOMY;  Surgeon: Jonnie Kind, MD;  Location: [facility];  Service: Gynecology;  Laterality: Bilateral;   LAPAROSCOPIC LYSIS OF ADHESIONS N/A 11/04/2018   Procedure: LAPAROSCOPIC LYSIS OF ADHESIONS;  Surgeon: Jonnie Kind, MD;  Location: [facility];  Service: Gynecology;  Laterality: N/A;   MYOMECTOMY N/A 04/24/2017   Procedure: ABDOMINAL MYOMECTOMY OF UTERUS;  Surgeon: Florian Buff, MD;  Location: [facility];  Service: Gynecology;  Laterality: N/A;   NO PAST SURGERIES     ORIF ORBITAL FRACTURE Left 05/11/2019   Procedure: OPEN TREATMENT LEFT ORBITAL FLOOR FRACTURE WITH IMPLANT PERIORBITAL APPROACH;  Surgeon: Irene Limbo, MD;  Location: [place];  Service: Plastics;  Laterality: Left;   REMOVAL OF NON VAGINAL CONTRACEPTIVE DEVICE Right 04/24/2017   Procedure: REMOVAL OF [place];  Surgeon: Florian Buff, MD;  Location: [facility];  Service: Gynecology;  Laterality: Right;   TUBAL LIGATION       OB History     Gravida  3   Para  2   Term  1   Preterm  1   AB  1   Living  3  SAB  1   IAB      Ectopic      Multiple  1   Live Births  3           Family History  Problem Relation Age of Onset   Hypertension Father    Diabetes Maternal Grandmother     Social History   Tobacco Use   Smoking status: Never   Smokeless tobacco: Never  Vaping Use   Vaping Use: Never used  Substance Use Topics   Alcohol use: Yes    Comment: weekends   Drug use: No    Home Medications Prior to Admission medications   Medication Sig Start Date End Date Taking? Authorizing Provider  ibuprofen (ADVIL) 600 MG tablet Take 1 tablet (600 mg total) by mouth every 6 (six)  hours as needed. 01/14/21   Chase Picket, MD  phentermine (ADIPEX-P) 37.5 MG tablet TAKE 1 TABLET(37.5 MG) BY MOUTH DAILY BEFORE BREAKFAST 07/25/20   Noreene Larsson, NP  phentermine (ADIPEX-P) 37.5 MG tablet TAKE 1 TABLET(37.5 MG) BY MOUTH DAILY BEFORE BREAKFAST 07/25/20   Noreene Larsson, NP    Allergies    Peanuts [peanut oil]  Review of Systems   Review of Systems  Gastrointestinal:  Positive for abdominal pain.  All other systems reviewed and are negative.  Physical Exam Updated Vital Signs BP 112/72   Pulse 90   Temp 97.7 F (36.5 C) (Oral)   Resp 18   Ht 5' (1.524 m)   Wt 92.1 kg   LMP 03/10/2021 (Exact Date)   SpO2 99%   BMI 39.65 kg/m   Physical Exam Vitals and nursing note reviewed.  Constitutional:      General: She is not in acute distress.    Appearance: She is well-developed. She is not diaphoretic.  HENT:     Head: Normocephalic and atraumatic.  Cardiovascular:     Rate and Rhythm: Normal rate and regular rhythm.     Heart sounds: No murmur heard.   No friction rub. No gallop.  Pulmonary:     Effort: Pulmonary effort is normal. No respiratory distress.     Breath sounds: Normal breath sounds. No wheezing.  Abdominal:     General: Bowel sounds are normal. There is no distension.     Palpations: Abdomen is soft.     Tenderness: There is abdominal tenderness in the right lower quadrant and suprapubic area. There is no right CVA tenderness, left CVA tenderness, guarding or rebound.  Musculoskeletal:        General: Normal range of motion.     Cervical back: Normal range of motion and neck supple.  Skin:    General: Skin is warm and dry.  Neurological:     General: No focal deficit present.     Mental Status: She is alert and oriented to person, place, and time.    ED Results / Procedures / Treatments   Labs (all labs ordered are listed, but only abnormal results are displayed) Labs Reviewed  LIPASE, BLOOD  COMPREHENSIVE METABOLIC PANEL  CBC   URINALYSIS, ROUTINE W REFLEX MICROSCOPIC  POC URINE PREG, ED    EKG None  Radiology No results found.  Procedures Procedures   Medications Ordered in ED Medications - No data to display  ED Course  I have reviewed the triage vital signs and the nursing notes.  Pertinent labs & imaging results that were available during my care of the patient were reviewed by me and  considered in my medical decision making (see chart for details).    MDM Rules/Calculators/A&P  Patient presenting here with complaints of suprapubic and right lower quadrant abdominal pain.  Patient's urinalysis reveals microscopic hematuria, however CT scan fails to show renal calculus.  No alternate pathology has been identified on the CT scan.  Her pregnancy test is negative, thus excluding ectopic.  This presentation is also inconsistent with torsion.  At this point, patient appears comfortable and in no discomfort.  I feel as though discharge is appropriate.  Patient will be advised to take ibuprofen, rest, and follow-up as needed if not improving/return if worsens.  Final Clinical Impression(s) / ED Diagnoses Final diagnoses:  None    Rx / DC Orders ED Discharge Orders     None        Veryl Speak, MD 03/11/21 0410

## 2021-03-13 ENCOUNTER — Telehealth: Payer: Self-pay

## 2021-03-13 NOTE — Telephone Encounter (Signed)
Transition Care Management Follow-up Telephone Call Date of discharge and from where: 03/11/2021-[place] How have you been since you were released from the hospital? Patient stated she is doing fine. Any questions or concerns? No  Items Reviewed: Did the pt receive and understand the discharge instructions provided? Yes  Medications obtained and verified? Yes  Other? No  Any new allergies since your discharge? No  Dietary orders reviewed? No Do you have support at home? Yes   Home Care and Equipment/Supplies: Were home health services ordered? not applicable If so, what is the name of the agency? N/A  Has the agency set up a time to come to the patient's home? not applicable Were any new equipment or medical supplies ordered?  No What is the name of the medical supply agency? N/A Were you able to get the supplies/equipment? not applicable Do you have any questions related to the use of the equipment or supplies? No  Functional Questionnaire: (I = Independent and D = Dependent) ADLs: I  Bathing/Dressing- I  Meal Prep- I  Eating- I  Maintaining continence- I  Transferring/Ambulation- I  Managing Meds- I  Follow up appointments reviewed:  PCP Hospital f/u appt confirmed? No   Specialist Hospital f/u appt confirmed? No   Are transportation arrangements needed? No  If their condition worsens, is the pt aware to call PCP or go to the Emergency Dept.? Yes Was the patient provided with contact information for the PCP's office or ED? Yes Was to pt encouraged to call back with questions or concerns? Yes

## 2021-04-21 ENCOUNTER — Encounter: Payer: 59 | Admitting: Nurse Practitioner

## 2021-06-09 ENCOUNTER — Encounter: Payer: 59 | Admitting: Nurse Practitioner

## 2021-07-07 ENCOUNTER — Other Ambulatory Visit: Payer: Self-pay | Admitting: Family Medicine

## 2021-07-07 DIAGNOSIS — E669 Obesity, unspecified: Secondary | ICD-10-CM

## 2021-07-25 ENCOUNTER — Other Ambulatory Visit: Payer: Self-pay | Admitting: Nurse Practitioner

## 2021-07-25 ENCOUNTER — Ambulatory Visit (INDEPENDENT_AMBULATORY_CARE_PROVIDER_SITE_OTHER): Payer: 59 | Admitting: Nurse Practitioner

## 2021-07-25 ENCOUNTER — Telehealth: Payer: Self-pay

## 2021-07-25 ENCOUNTER — Other Ambulatory Visit: Payer: Self-pay

## 2021-07-25 ENCOUNTER — Encounter: Payer: Self-pay | Admitting: Nurse Practitioner

## 2021-07-25 DIAGNOSIS — K219 Gastro-esophageal reflux disease without esophagitis: Secondary | ICD-10-CM | POA: Diagnosis not present

## 2021-07-25 DIAGNOSIS — N943 Premenstrual tension syndrome: Secondary | ICD-10-CM | POA: Diagnosis not present

## 2021-07-25 DIAGNOSIS — Z2821 Immunization not carried out because of patient refusal: Secondary | ICD-10-CM | POA: Insufficient documentation

## 2021-07-25 MED ORDER — IBUPROFEN 600 MG PO TABS: 600.0000 mg | ORAL_TABLET | Freq: Two times a day (BID) | ORAL | 0 refills | Status: DC | PRN

## 2021-07-25 MED ORDER — FAMOTIDINE 20 MG PO TABS: 20.0000 mg | ORAL_TABLET | Freq: Every day | ORAL | 1 refills | Status: DC

## 2021-07-25 NOTE — Assessment & Plan Note (Signed)
Take famotidine 20 mg daily as needed. Need to avoid offending foods like spicy foods, greasy food, chocolate discussed with patient she verbalized understandin

## 2021-07-25 NOTE — Assessment & Plan Note (Signed)
Premenstrual syndrome Has abdominal cramps first 2 days of her menstrual cycle,  She has been taking ibuprofen as needed, needs a refill of her ibuprofen Ibuprofen 600 mg 2 times daily as needed for abdominal cramps ordered today. Patient educated on the need to eat before taking medication she verbalized understanding.

## 2021-07-25 NOTE — Telephone Encounter (Signed)
Called pt to schedule physical per [facility] no answer left vm

## 2021-07-25 NOTE — Progress Notes (Signed)
Virtual Visit via Telephone Note  I connected with Jacqueline Underwood @ on 07/25/21 at 342pm by telephone and verified that I am speaking with the correct person using two identifiers.  I spent 7 minutes on this telephone encounter  Location: Patient: home Provider: office   I discussed the limitations, risks, security and privacy concerns of performing an evaluation and management service by telephone and the availability of in person appointments. I also discussed with the patient that there may be a patient responsible charge related to this service. The patient expressed understanding and agreed to proceed.   History of Present Illness: Pt with history of obesity presents for refills for famotidine 20mg  daily and ibuprofen 600 mg tablets. She has acid reflux when she lays down at night, pt denies abdominal , chest pain, bloody stool, fever chills. She has been avoiding fatty foods.  States that she takes famotidine 20 mg daily at bedtime for her acid reflux    Abdominal cramps pt states that during her menstrual cycles she has abdominal cramps the first two days , ibuprofen helps her cramps.  Patient stated that she needs a refill of this medication today Observations/Objective:   Assessment and Plan: GERD. Take famotidine 20 mg daily as needed. Need to avoid offending foods like spicy foods, greasy food, chocolate discussed with patient she verbalized understanding  Premenstrual syndrome Has abdominal cramps first 2 days of her menstrual cycle,  She has been taking ibuprofen as needed, needs a refill of her ibuprofen Ibuprofen 600 mg 2 times daily as needed for abdominal cramps ordered today. Patient educated on the need to eat before taking medication she verbalized understanding.   Follow Up Instructions: Patient is due for annual  physical examination, will have patient scheduled for it in 1 to 2 months, plan discussed with patient she verbalized understanding.    I discussed the  assessment and treatment plan with the patient. The patient was provided an opportunity to ask questions and all were answered. The patient agreed with the plan and demonstrated an understanding of the instructions.   The patient was advised to call back or seek an in-person evaluation if the symptoms worsen or if the condition fails to improve as anticipated.

## 2021-07-27 NOTE — Telephone Encounter (Signed)
Called pt to schedule cpe call was disconnected tried calling back no answer left vm  ?

## 2021-08-12 IMAGING — DX DG HAND COMPLETE 3+V*L*
3 series · 3 of 3 positions shown · non-contrast
Comparison: None.

CLINICAL DATA: Acute LEFT hand pain following injury 2 weeks ago.

EXAM:
LEFT HAND - COMPLETE 3+ VIEW

[hand pa]
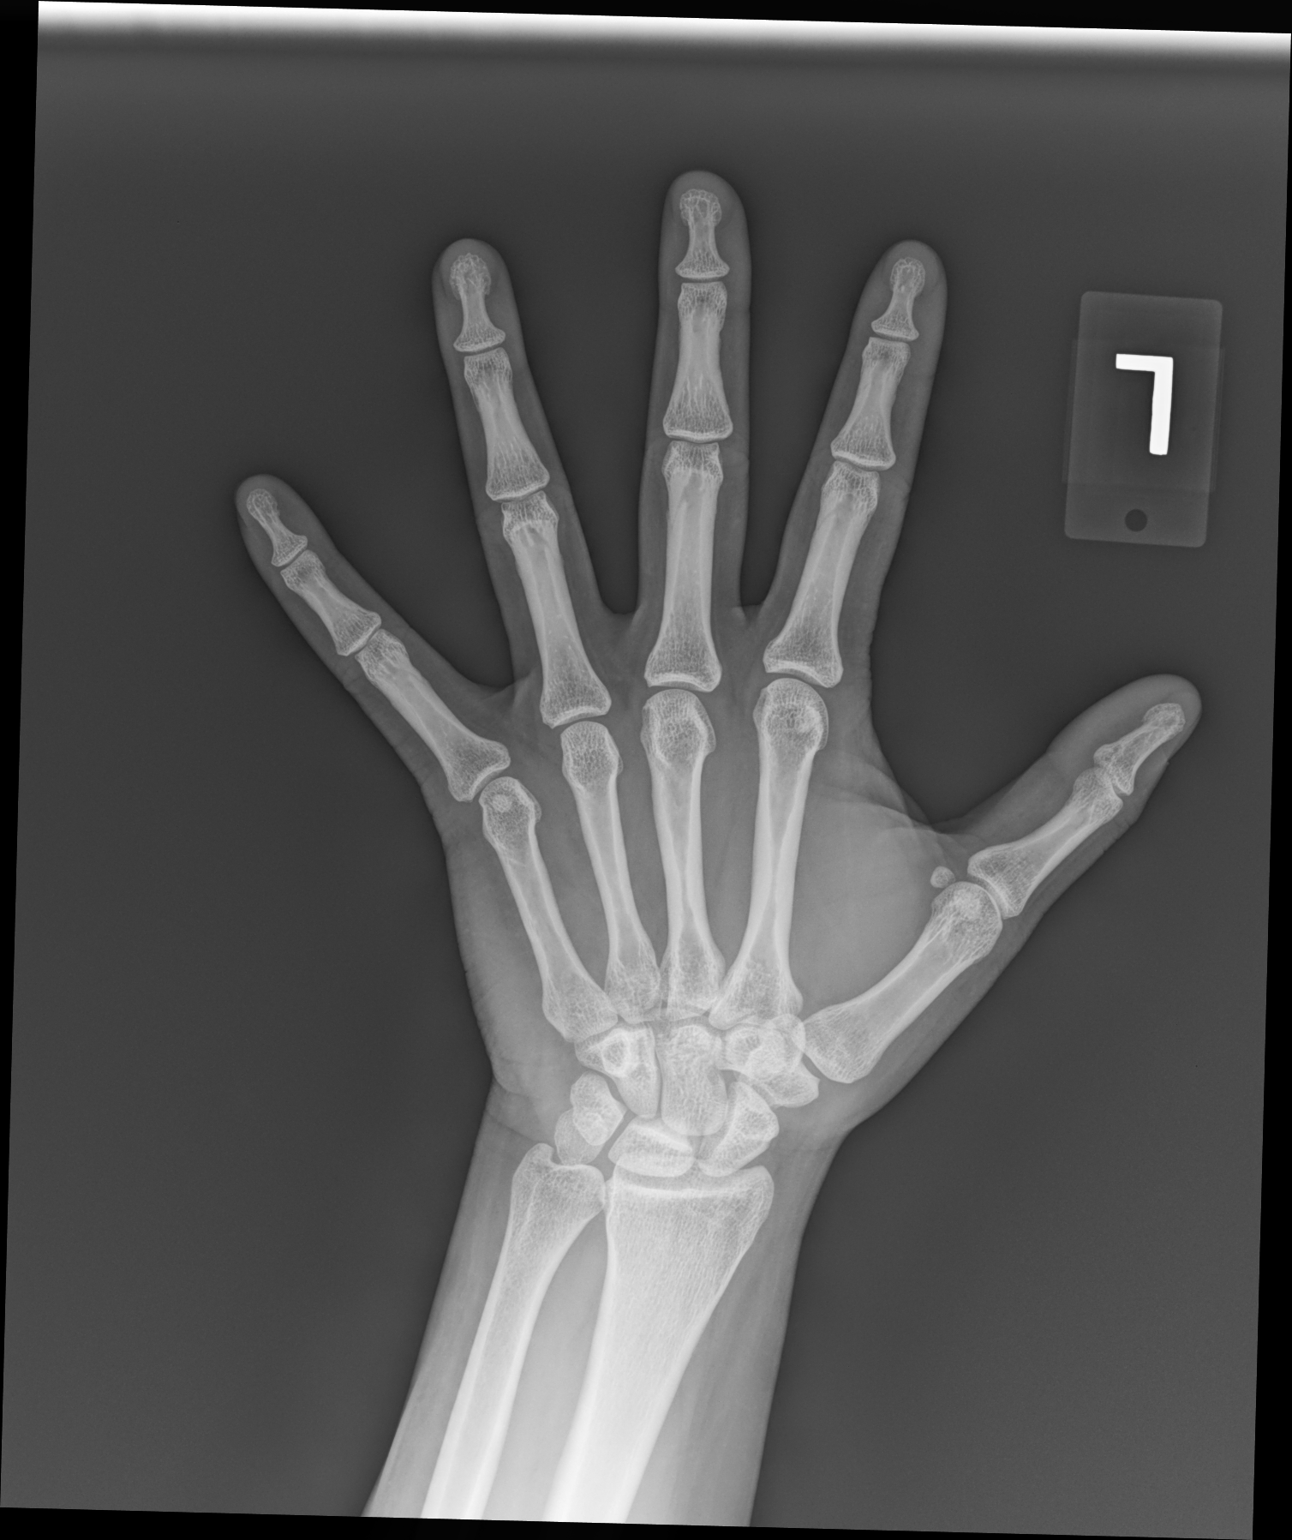

[hand mlo]
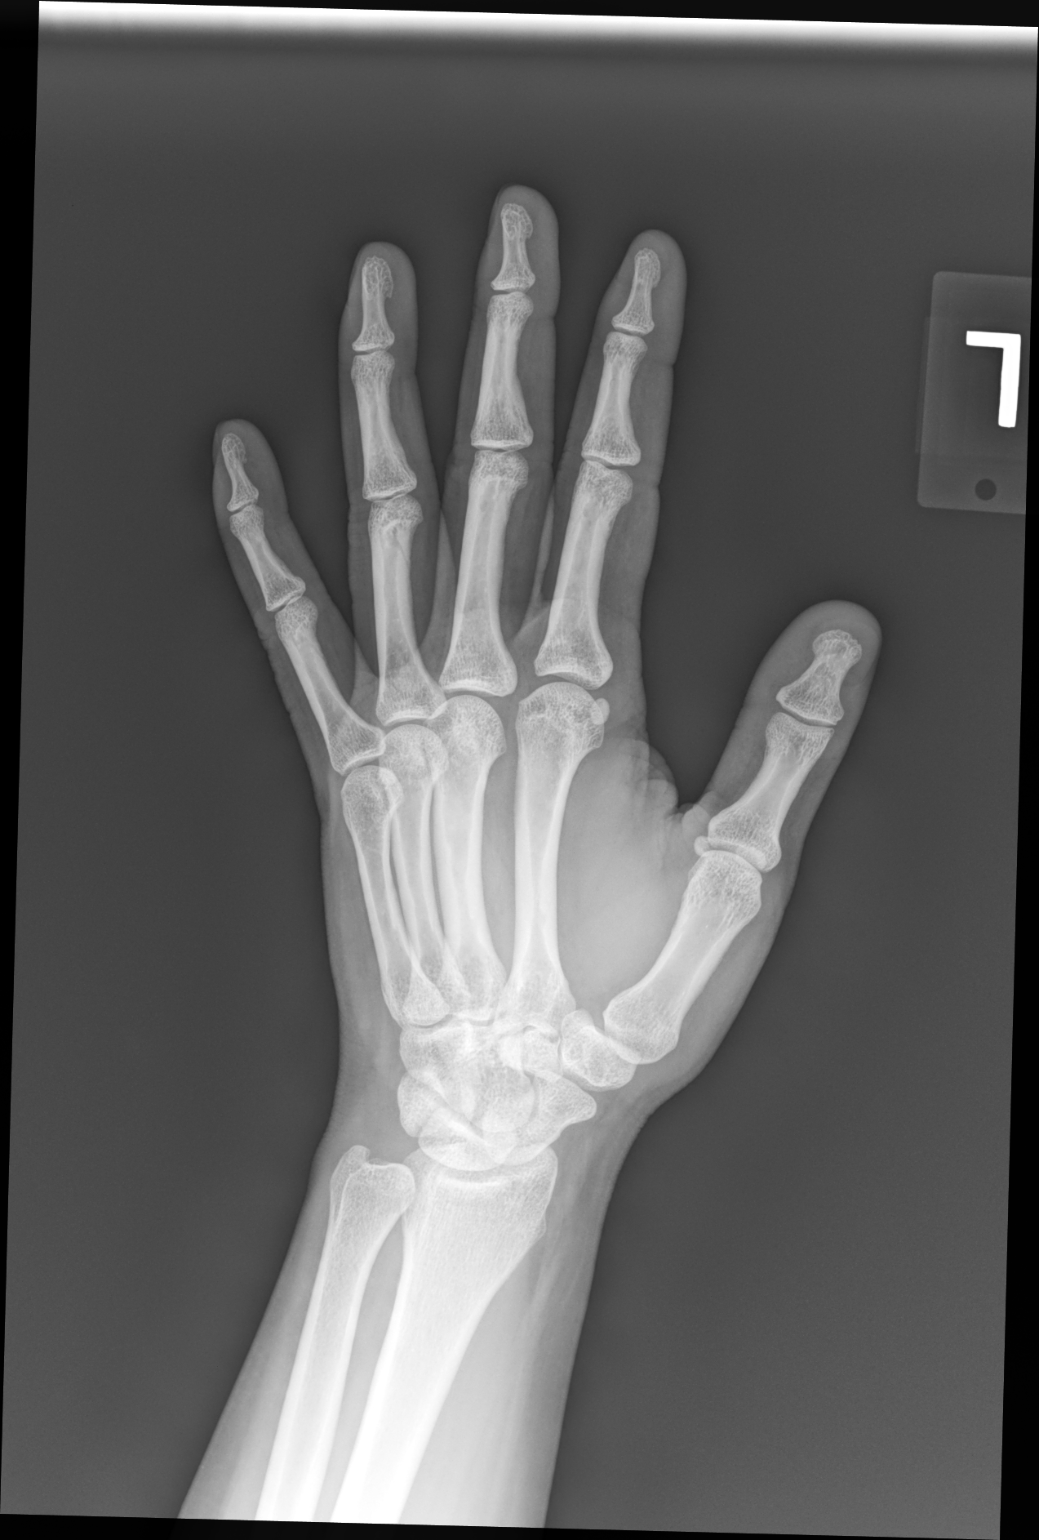

[hand lat]
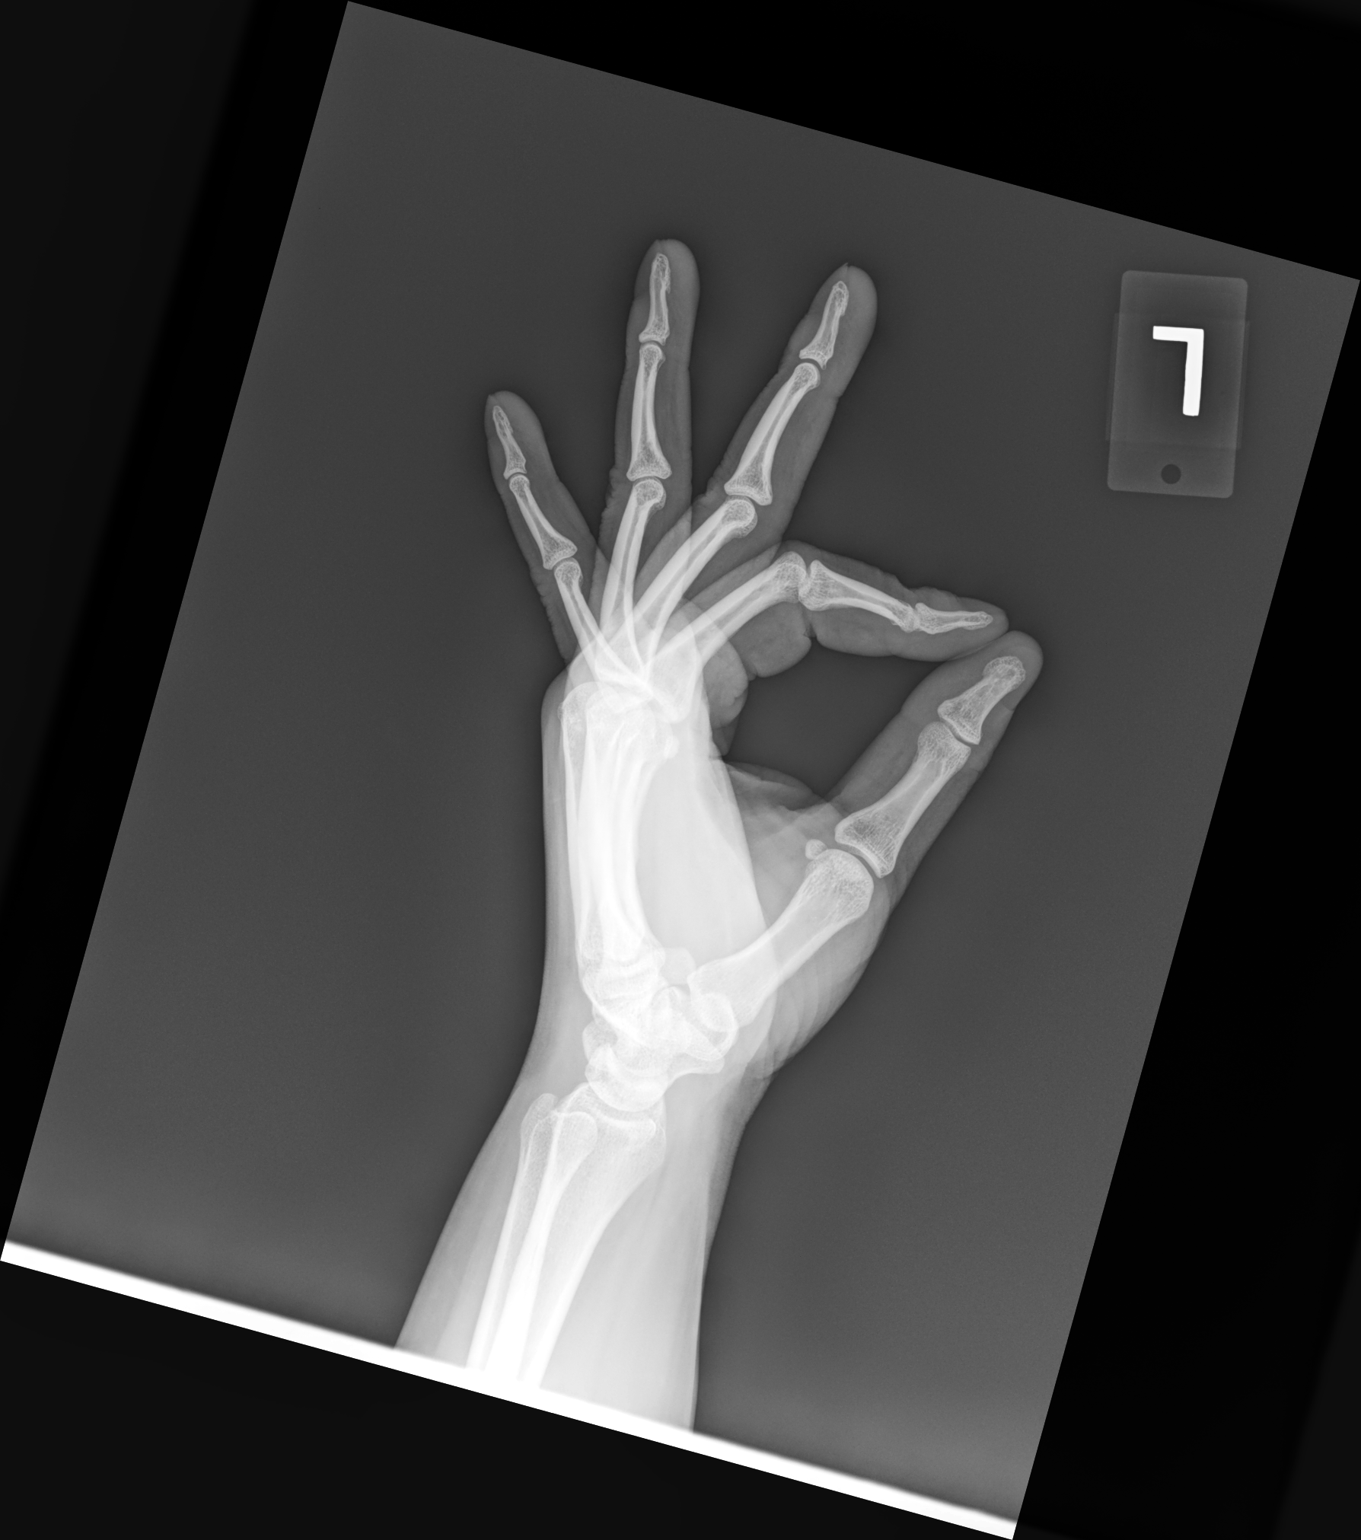

[3 of 3 positions shown; findings below may reference images not displayed]

FINDINGS: There is no evidence of fracture or dislocation. There is no
evidence of arthropathy or other focal bone abnormality. Soft
tissues are unremarkable.
IMPRESSION: Negative.

## 2021-09-21 ENCOUNTER — Encounter: Payer: 59 | Admitting: Nurse Practitioner

## 2021-10-27 ENCOUNTER — Encounter: Payer: 59 | Admitting: Nurse Practitioner

## 2021-11-18 ENCOUNTER — Encounter (HOSPITAL_COMMUNITY): Payer: Self-pay

## 2021-11-18 ENCOUNTER — Other Ambulatory Visit: Payer: Self-pay

## 2021-11-18 ENCOUNTER — Emergency Department (HOSPITAL_COMMUNITY): Payer: 59

## 2021-11-18 ENCOUNTER — Emergency Department (HOSPITAL_COMMUNITY)
Admission: EM | Admit: 2021-11-18 | Discharge: 2021-11-18 | Disposition: A | Discharge status: Home / Self Care | Payer: 59 | Attending: Emergency Medicine | Admitting: Emergency Medicine

## 2021-11-18 DIAGNOSIS — S79921A Unspecified injury of right thigh, initial encounter: Secondary | ICD-10-CM | POA: Diagnosis present

## 2021-11-18 DIAGNOSIS — Z91010 Allergy to peanuts: Secondary | ICD-10-CM | POA: Diagnosis not present

## 2021-11-18 DIAGNOSIS — S81811A Laceration without foreign body, right lower leg, initial encounter: Secondary | ICD-10-CM

## 2021-11-18 DIAGNOSIS — S71111A Laceration without foreign body, right thigh, initial encounter: Secondary | ICD-10-CM | POA: Diagnosis not present

## 2021-11-18 DIAGNOSIS — W25XXXA Contact with sharp glass, initial encounter: Secondary | ICD-10-CM | POA: Diagnosis not present

## 2021-11-18 MED ORDER — LIDOCAINE-EPINEPHRINE (PF) 2 %-1:200000 IJ SOLN
20.0000 mL | Freq: Once | INTRAMUSCULAR | Status: AC
  Administered 2021-11-18: 20 mL

## 2021-11-18 MED ORDER — LIDOCAINE-EPINEPHRINE-TETRACAINE (LET) TOPICAL GEL
3.0000 mL | Freq: Once | TOPICAL | Status: AC
  Administered 2021-11-18: 3 mL via TOPICAL
  Filled 2021-11-18: qty 3

## 2021-11-18 MED ORDER — BACITRACIN ZINC 500 UNIT/GM EX OINT
TOPICAL_OINTMENT | Freq: Two times a day (BID) | CUTANEOUS | Status: DC
  Filled 2021-11-18: qty 3.6
  Filled 2021-11-18: qty 0.9

## 2021-11-18 MED ORDER — OXYCODONE-ACETAMINOPHEN 5-325 MG PO TABS: 1.0000 | ORAL_TABLET | Freq: Four times a day (QID) | ORAL | 0 refills | Status: DC | PRN

## 2021-11-18 MED ORDER — LIDOCAINE-EPINEPHRINE (PF) 2 %-1:200000 IJ SOLN
INTRAMUSCULAR | Status: AC
  Filled 2021-11-18: qty 20

## 2021-11-18 MED ORDER — OXYCODONE-ACETAMINOPHEN 5-325 MG PO TABS
2.0000 | ORAL_TABLET | Freq: Once | ORAL | Status: AC
  Administered 2021-11-18: 2 via ORAL
  Filled 2021-11-18: qty 2

## 2021-11-18 NOTE — ED Triage Notes (Signed)
Pt brought in by friend. Pt has a glass screen door that was busted out, she walked through the door without opening it. Pt has multiple superficial wounds to back of R arm. R lateral leg has multiple lacerations. Bleeding is controlled at this time. Pt in no acute distress.

## 2021-11-20 MED FILL — Oxycodone w/ Acetaminophen Tab 5-325 MG: ORAL | Qty: 6 | Status: AC

## 2021-12-01 ENCOUNTER — Ambulatory Visit (INDEPENDENT_AMBULATORY_CARE_PROVIDER_SITE_OTHER): Payer: 59 | Admitting: Family Medicine

## 2021-12-01 ENCOUNTER — Encounter: Payer: Self-pay | Admitting: Family Medicine

## 2021-12-01 VITALS — BP 103/69 | HR 95 | Ht 60.0 in | Wt 235.6 lb

## 2021-12-01 DIAGNOSIS — S81811A Laceration without foreign body, right lower leg, initial encounter: Secondary | ICD-10-CM | POA: Insufficient documentation

## 2021-12-01 DIAGNOSIS — E669 Obesity, unspecified: Secondary | ICD-10-CM

## 2021-12-01 DIAGNOSIS — S81811D Laceration without foreign body, right lower leg, subsequent encounter: Secondary | ICD-10-CM | POA: Diagnosis not present

## 2021-12-01 MED ORDER — PHENTERMINE HCL 37.5 MG PO TABS: 37.5000 mg | ORAL_TABLET | ORAL | 0 refills | Status: DC

## 2021-12-01 NOTE — Assessment & Plan Note (Signed)
Notes to have been exercising and eating clean but has been gaining wt No hx of cardiovascular disease Phentermine ordered Will f/u in 1 month

## 2021-12-01 NOTE — Assessment & Plan Note (Signed)
-  Sutures were removed at home by the patient on 12/01/21 -Top Incision on the right extremity is well approximated with no signs of infection noted -Bottom incision is slightly dehiscence. No warmth, redness, swelling, or pus was noted -Steri strips were applied to the bottom incision, and antibiotic ointment given -Inform the patient to RTC if increased swelling, warmth, redness, pus, and drainage are noted

## 2021-12-01 NOTE — Patient Instructions (Signed)
I appreciate the opportunity to provide care to you today!    Follow up:  1 months  -please follow-up for worsening pain, redness, swelling and pus at the incision site   Please continue to a heart-healthy diet and increase your physical activities. Try to exercise for 33mns at least three times a week.      It was a pleasure to see you and I look forward to continuing to work together on your health and well-being. Please do not hesitate to call the office if you need care or have questions about your care.   Have a wonderful day and week. With Gratitude, GAlvira MondayMSN, FNP-BC

## 2021-12-01 NOTE — Progress Notes (Signed)
Established Patient Office Visit  Subjective:  Patient ID: Jacqueline Underwood, female    DOB: 10-18-1990  Age: 31 y.o. MRN: [id number]  CC:  Chief Complaint  Patient presents with   Suture / Staple Removal    Pt removed stitches on her own has a little section she needs looked at.    Obesity    Needs a refill on phentermine used to get this from previous PCP, Bonna Gains, NP.     HPI Jacqueline Underwood is a 31 y.o. female with past medical history of GERD presents for sutures removal but states that she removed the 5 sutures placed in the ED  on 11/30/21. She will like a refill of phentermine.    Past Medical History:  Diagnosis Date   Closed fracture of left orbital floor ([place]) 05/11/2019   Dysmenorrhea 03/25/2017   Encounter for sterilization 01/25/2015   11/04/2018 bilateral salpingectomy   Fibroids 10/21/2012   05/30/12: 4.5cm     2/18: 5cm       06/15/14 @ 8wks: uterine pedunculated fibroid: 9.3x6.6x6.5    2/17 @ 12.3wks: 10.4x6.7    6/8 @ 28.3wks: 10.7x7.3x6.56cm    H/O cesarean section 01/04/2015   @ 30.4wks w/ twins d/t br/br presentation in active labor    History of 2019 novel coronavirus disease (COVID-19) 02/08/2019   Menometrorrhagia 03/25/2017   Moody 03/25/2017   Preterm delivery, delivered 01/04/2015   30.4wks d/t spontaneous active labor, twins    S/P myomectomy 04/24/2017   Small adhesiolysis p c/s done at tubal.   Uterine myoma     Past Surgical History:  Procedure Laterality Date   CESAREAN SECTION MULTI-GESTATIONAL  11/18/2014   Procedure: CESAREAN SECTION MULTI-GESTATIONAL;  Surgeon: Donnamae Jude, MD;  Location: [place] [facility];  Service: Obstetrics;;   LAPAROSCOPIC BILATERAL SALPINGECTOMY Bilateral 11/04/2018   Procedure: LAPAROSCOPIC BILATERAL SALPINGECTOMY;  Surgeon: Jonnie Kind, MD;  Location: [facility];  Service: Gynecology;  Laterality: Bilateral;   LAPAROSCOPIC LYSIS OF ADHESIONS N/A 11/04/2018   Procedure: LAPAROSCOPIC LYSIS OF ADHESIONS;  Surgeon: Jonnie Kind, MD;   Location: [facility];  Service: Gynecology;  Laterality: N/A;   MYOMECTOMY N/A 04/24/2017   Procedure: ABDOMINAL MYOMECTOMY OF UTERUS;  Surgeon: Florian Buff, MD;  Location: [facility];  Service: Gynecology;  Laterality: N/A;   NO PAST SURGERIES     ORIF ORBITAL FRACTURE Left 05/11/2019   Procedure: OPEN TREATMENT LEFT ORBITAL FLOOR FRACTURE WITH IMPLANT PERIORBITAL APPROACH;  Surgeon: Irene Limbo, MD;  Location: [place];  Service: Plastics;  Laterality: Left;   REMOVAL OF NON VAGINAL CONTRACEPTIVE DEVICE Right 04/24/2017   Procedure: REMOVAL OF [place];  Surgeon: Florian Buff, MD;  Location: [facility];  Service: Gynecology;  Laterality: Right;   TUBAL LIGATION      Family History  Problem Relation Age of Onset   Hypertension Father    Diabetes Maternal Grandmother     Social History   Socioeconomic History   Marital status: Single    Spouse name: Not on file   Number of children: 3   Years of education: Not on file   Highest education level: Not on file  Occupational History   Not on file  Tobacco Use   Smoking status: Never   Smokeless tobacco: Never  Vaping Use   Vaping Use: Never used  Substance and Sexual Activity   Alcohol use: Not Currently    Comment: weekends   Drug use: No   Sexual  activity: Yes  Other Topics Concern   Not on file  Social History Narrative   Lives with 3 sons   Cameron-5   Corey-3   Carter-3      Father is involved      Works at [employer] and [employer]: spending time with children   Dad is close by      Diet: fast food, red meat, eats a lot portion wise, but does eat veggies and fruits   Caffeine: drinks sweet tea   Water: 3 bottles         Wear seatbelt   Wears sunscreen   Smoke detectors and carbon monoxide    Does not use phone while driving.   Social Determinants of Health   Financial Resource Strain: Low Risk  (11/11/2018)   Overall Financial Resource Strain (CARDIA)     Difficulty of Paying Living Expenses: Not hard at all  Food Insecurity: No Food Insecurity (11/11/2018)   Hunger Vital Sign    Worried About Running Out of Food in the Last Year: Never true    Ran Out of Food in the Last Year: Never true  Transportation Needs: No Transportation Needs (11/11/2018)   PRAPARE - Hydrologist (Medical): No    Lack of Transportation (Non-Medical): No  Physical Activity: Inactive (11/11/2018)   Exercise Vital Sign    Days of Exercise per Week: 0 days    Minutes of Exercise per Session: 0 min  Stress: No Stress Concern Present (11/11/2018)   [place]    Feeling of Stress : Not at all  Social Connections: Socially Isolated (11/11/2018)   Social Connection and Isolation Panel [NHANES]    Frequency of Communication with Friends and Family: More than three times a week    Frequency of Social Gatherings with Friends and Family: More than three times a week    Attends Religious Services: Never    Marine scientist or Organizations: No    Attends Archivist Meetings: Never    Marital Status: Never married  Intimate Partner Violence: Not At Risk (11/11/2018)   Humiliation, Afraid, Rape, and Kick questionnaire    Fear of Current or Ex-Partner: No    Emotionally Abused: No    Physically Abused: No    Sexually Abused: No    Outpatient Medications Prior to Visit  Medication Sig Dispense Refill   famotidine (PEPCID) 20 MG tablet Take 1 tablet (20 mg total) by mouth daily. 30 tablet 1   ibuprofen (ADVIL) 600 MG tablet Take 1 tablet (600 mg total) by mouth 2 (two) times daily as needed. 30 tablet 0   phentermine (ADIPEX-P) 37.5 MG tablet Take 37.5 mg by mouth every morning.     oxyCODONE-acetaminophen (PERCOCET/ROXICET) 5-325 MG tablet Take 1 tablet by mouth every 6 (six) hours as needed for severe pain. (Patient not taking: Reported on 12/01/2021) 6 tablet 0    tiZANidine (ZANAFLEX) 4 MG tablet Take 1 tablet (4 mg total) by mouth at bedtime. (Patient not taking: Reported on 12/01/2021) 30 tablet 0   No facility-administered medications prior to visit.    Allergies  Allergen Reactions   Peanuts [Peanut Oil] Itching    Itching in throat.    ROS Review of Systems  Constitutional:  Negative for chills and fever.  Cardiovascular:  Negative for chest pain and palpitations.  Musculoskeletal:  Reports removing the sutures at home on 11/30/21  Neurological:  Negative for tremors, weakness and headaches.      Objective:    Physical Exam Cardiovascular:     Rate and Rhythm: Normal rate and regular rhythm.     Pulses: Normal pulses.     Heart sounds: Normal heart sounds.  Pulmonary:     Effort: Pulmonary effort is normal.     Breath sounds: Normal breath sounds.  Abdominal:     Palpations: Abdomen is soft.  Skin:    General: Skin is warm.     Comments: Top Incision on the right thigh is well approximated with no signs of infection noted. Bottom incision is slightly dishiscence. No warmth, redness, swelling, or pus was noted.    Neurological:     Mental Status: She is alert.     BP 103/69   Pulse 95   Ht 5' (1.524 m)   Wt 235 lb 9.6 oz (106.9 kg)   SpO2 98%   BMI 46.01 kg/m  Wt Readings from Last 3 Encounters:  12/01/21 235 lb 9.6 oz (106.9 kg)  11/18/21 215 lb (97.5 kg)  03/11/21 203 lb (92.1 kg)    Lab Results  Component Value Date   TSH 0.978 01/01/2020   Lab Results  Component Value Date   WBC 7.0 03/11/2021   HGB 12.1 03/11/2021   HCT 35.7 (L) 03/11/2021   MCV 94.9 03/11/2021   PLT 266 03/11/2021   Lab Results  Component Value Date   NA 137 03/11/2021   K 3.6 03/11/2021   CO2 24 03/11/2021   GLUCOSE 98 03/11/2021   BUN 18 03/11/2021   CREATININE 0.70 03/11/2021   BILITOT 0.2 (L) 03/11/2021   ALKPHOS 53 03/11/2021   AST 20 03/11/2021   ALT 14 03/11/2021   PROT 7.9 03/11/2021   ALBUMIN 3.8 03/11/2021    CALCIUM 8.9 03/11/2021   ANIONGAP 7 03/11/2021   Lab Results  Component Value Date   CHOL 186 01/01/2020   Lab Results  Component Value Date   HDL 77 01/01/2020   Lab Results  Component Value Date   LDLCALC 100 (H) 01/01/2020   Lab Results  Component Value Date   TRIG 45 01/01/2020   Lab Results  Component Value Date   CHOLHDL 2.4 01/01/2020   Lab Results  Component Value Date   HGBA1C 4.7 (L) 01/01/2020      Assessment & Plan:   Problem List Items Addressed This Visit       Other   Obesity with body mass index (BMI) of 35.0 to 39.9 without comorbidity - Primary    Notes to have been exercising and eating clean but has been gaining wt No hx of cardiovascular disease Phentermine ordered Will f/u in 1 month       Relevant Medications   phentermine (ADIPEX-P) 37.5 MG tablet   Laceration of right lower extremity    -Sutures were removed at home by the patient on 12/01/21 -Top Incision on the right extremity is well approximated with no signs of infection noted -Bottom incision is slightly dehiscence. No warmth, redness, swelling, or pus was noted -Steri strips were applied to the bottom incision, and antibiotic ointment given -Inform the patient to RTC if increased swelling, warmth, redness, pus, and drainage are noted         Meds ordered this encounter  Medications   phentermine (ADIPEX-P) 37.5 MG tablet    Sig: Take 1 tablet (37.5 mg total) by  mouth every morning.    Dispense:  30 tablet    Refill:  0    Follow-up: Return in about 1 year (around 12/02/2022) for wt loss.    Alvira Monday, FNP

## 2022-01-02 ENCOUNTER — Ambulatory Visit: Payer: 59 | Admitting: Nurse Practitioner

## 2022-01-02 ENCOUNTER — Encounter: Payer: Self-pay | Admitting: Nurse Practitioner

## 2022-01-06 ENCOUNTER — Emergency Department (HOSPITAL_COMMUNITY)
Admission: EM | Admit: 2022-01-06 | Discharge: 2022-01-06 | Disposition: A | Discharge status: Home / Self Care | Payer: No Typology Code available for payment source | Source: Home / Self Care

## 2022-09-11 ENCOUNTER — Ambulatory Visit: Payer: Self-pay

## 2023-08-22 ENCOUNTER — Ambulatory Visit: Payer: Self-pay

## 2023-10-08 ENCOUNTER — Other Ambulatory Visit: Payer: Self-pay | Admitting: Obstetrics & Gynecology

## 2023-10-31 ENCOUNTER — Encounter (HOSPITAL_COMMUNITY): Payer: Self-pay | Admitting: Obstetrics & Gynecology

## 2023-10-31 NOTE — Pre-Procedure Instructions (Signed)
 Surgical Instructions   Your procedure is scheduled on :  Friday,  11-08-2023. Report to [facility] Main Entrance "A" at 5:30  A.M., then check in with the Admitting office. Any questions or running late day of surgery: call [phone]  Questions prior to your surgery date: call [phone], Monday-Friday, 8am-4pm. If you experience any cold or flu symptoms such as cough, fever, chills, shortness of breath, etc. between now and your scheduled surgery, please notify your surgeon office.    Remember:  Do not eat any food and do not drink any liquids after midnight the night before your surgery.  This includes no water,  candy,  gum,  and  mints.    Take these medicines the morning of surgery with A SIP OF WATER : none   May take these medicines IF NEEDED:  none    One week prior to surgery, STOP taking any Aspirin (unless otherwise instructed by your surgeon) Aleve , Naproxen , Ibuprofen , Motrin , Advil , Goody's, BC's, all herbal medications, fish oil, and non-prescription vitamins.                     Do NOT Smoke (Tobacco/Vaping) and Do Not drink alcohol for 24 hours prior to your procedure.  If you use a CPAP at night, you may bring your mask/headgear for your overnight stay.   You will be asked to remove any contacts, glasses, piercing's, hearing aid's, dentures/partials prior to surgery. Please bring cases for these items if needed.    Patients discharged the day of surgery will not be allowed to drive home, and someone needs to stay with them for 24 hours.  SURGICAL WAITING ROOM VISITATION Patients may have no more than 2 support people in the waiting area - these visitors may rotate.   Pre-op nurse will coordinate an appropriate time for 1 ADULT support person, who may not rotate, to accompany patient in pre-op.  Children under the age of 58 must have an adult with them who is not the patient and must remain in the main waiting area with an adult.  If the patient needs to  stay at the hospital during part of their recovery, the visitor guidelines for inpatient rooms apply.  Please refer to the [facility] website for the visitor guidelines for any additional information.   If you received a COVID test during your pre-op visit  it is requested that you wear a mask when out in public, stay away from anyone that may not be feeling well and notify your surgeon if you develop symptoms. If you have been in contact with anyone that has tested positive in the last 10 days please notify you surgeon.      Pre-operative CHG Bathing Instructions   You can play a key role in reducing the risk of infection after surgery. Your skin needs to be as free of germs as possible. You can reduce the number of germs on your skin by washing with CHG (chlorhexidine  gluconate) soap before surgery. CHG is an antiseptic soap that kills germs and continues to kill germs even after washing.   DO NOT use if you have an allergy to chlorhexidine /CHG or antibacterial soaps. If your skin becomes reddened or irritated, stop using the CHG and notify Pre-op nurse day of surgery.  ~Please get dial soap or other antibacterial soap (no scent) and shower following the instructions below.             TAKE A SHOWER THE NIGHT BEFORE SURGERY  AND THE DAY OF SURGERY    Please keep in mind the following:  DO NOT shave, including legs and underarms, 48 hours prior to surgery.   You may shave your face before/day of surgery.  Place clean sheets on your bed the night before surgery Use a clean washcloth (not used since being washed) for each shower. DO NOT sleep with pet's night before surgery.  CHG Shower Instructions:  Wash your face and private area with normal soap. If you choose to wash your hair, wash first with your normal shampoo.  After you use shampoo/soap, rinse your hair and body thoroughly to remove shampoo/soap residue.  Turn the water OFF and apply half the bottle of CHG soap to a CLEAN  washcloth.  Apply CHG soap ONLY FROM YOUR NECK DOWN TO YOUR TOES (washing for 3-5 minutes)  DO NOT use CHG soap on face, private areas, open wounds, or sores.  Pay special attention to the area where your surgery is being performed.  If you are having back surgery, having someone wash your back for you may be helpful. Wait 2 minutes after CHG soap is applied, then you may rinse off the CHG soap.  Pat dry with a clean towel  Put on clean pajamas    Additional instructions for the day of surgery: DO NOT APPLY any lotions,  powder,  oils,  deodorants (may use underarm deodorant) , cologne/  perfumes  or makeup Do not wear jewelry /  piercing's/  metal/  permanent jewelry must be removed prior to arrival day of surgery.  (No plastic piercing) Do not wear nail polish, gel polish, artificial nails, or any other type of covering on natural finger nails (toe nails are okay) Do not bring valuables to the hospital. [facility] is not responsible for valuables/personal belongings. Put on clean/comfortable clothes.  Please brush your teeth and rinse mouth out. Ask your nurse before applying any prescription medications to the skin.

## 2023-10-31 NOTE — Progress Notes (Signed)
 Spoke w/ via phone for pre-op interview--- pt Lab needs dos----    urine preg     Lab results------ lab appt 11-06-2023 @ 1300 getting CBC/ BMP/ T&S COVID test -----patient states asymptomatic no test needed Arrive at -------  0530 on 11-08-2023 NPO after MN   Pre-Surgery Ensure or G2: n/a  Med rec completed Medications to take morning of surgery ----- none Diabetic medication ----- n/a  GLP1 agonist last dose: n/a GLP1 instructions:  Patient instructed no nail polish to be worn day of surgery Patient instructed to bring photo id and insurance card day of surgery Patient aware to have Driver (ride ) / caregiver    for 24 hours after surgery - mother, Scientist, research (physical sciences) Patient Special Instructions ----- will pick up bag w/ soap and written instructions at lab appt Pre-Op special Instructions ----- n/a  Patient verbalized understanding of instructions that were given at this phone interview. Patient denies chest pain, sob, fever, cough at the interview.

## 2023-11-07 ENCOUNTER — Encounter (HOSPITAL_COMMUNITY)
Admission: RE | Admit: 2023-11-07 | Discharge: 2023-11-07 | Disposition: A | Discharge status: Home / Self Care | Source: Ambulatory Visit | Attending: Obstetrics & Gynecology | Admitting: Obstetrics & Gynecology

## 2023-11-07 DIAGNOSIS — Z01812 Encounter for preprocedural laboratory examination: Secondary | ICD-10-CM | POA: Insufficient documentation

## 2023-11-07 LAB — BASIC METABOLIC PANEL WITH GFR
Anion gap: 5 (ref 5–15)
BUN: 15 mg/dL (ref 6–20)
CO2: 26 mmol/L (ref 22–32)
Calcium: 9.2 mg/dL (ref 8.9–10.3)
Chloride: 105 mmol/L (ref 98–111)
Creatinine, Ser: 0.87 mg/dL (ref 0.44–1.00)
GFR, Estimated: >60 mL/min (ref >60)
Glucose, Bld: 100 mg/dL — ABNORMAL HIGH (ref 70–99)
Potassium: 4.1 mmol/L (ref 3.5–5.1)
Sodium: 136 mmol/L (ref 135–145)

## 2023-11-07 LAB — CBC
HCT: 36.7 % (ref 36.0–46.0)
Hemoglobin: 12 g/dL (ref 12.0–15.0)
MCH: 31.7 pg (ref 26.0–34.0)
MCHC: 32.7 g/dL (ref 30.0–36.0)
MCV: 97.1 fL (ref 80.0–100.0)
Platelets: 300 10*3/uL (ref 150–400)
RBC: 3.78 MIL/uL — ABNORMAL LOW (ref 3.87–5.11)
RDW: 11.5 % (ref 11.5–15.5)
WBC: 6.5 10*3/uL (ref 4.0–10.5)
nRBC: 0 % (ref 0.0–0.2)

## 2023-11-07 LAB — TYPE AND SCREEN
ABO/RH(D): O POS
Antibody Screen: NEGATIVE

## 2023-11-07 NOTE — H&P (Signed)
 Balinda RANIYA GOLEMBESKI is an 33 y.o. female here for hysterectomy due to recurrent fibroids.   [id number], one term SVD 7 lbs and then preterm twins C/section in 2016.  Since then one open myomectomy Dr Randolm Butte in 2018. and Lap TL in 2020 by Dr Monty App. Now new fibroid, symptomatic w/ bleeding and pressure and wants definitive surgery w/ hysterectomy  Nl Pap hx.  S/p TL. Not desiring to have more kids.   Sono Mar'25 enlarged uterus. 14.6 x 8 x 7cm, 5.8 mm EMS, anteverted, no FF, simple cyst on R ovary 4cm, nl L ovary. fundal SS fibroi 7.7 x7x8cm, also ant IM 1.7 cm.    Pertinent Gynecological History: Menses: heavy and painful Contraception: tubal ligation DES exposure: denies Blood transfusions: none Sexually transmitted diseases: no past history Previous GYN Procedures: C/section, myomectomy, Lap TL   Last mammogram: NA  Last pap: normal Date: 2024   Patient's last menstrual period was 10/17/2023 (exact date).    Past Medical History:  Diagnosis Date   GERD (gastroesophageal reflux disease)    watches diet   Menorrhagia    Seasonal allergic rhinitis    Uterine fibroid     Past Surgical History:  Procedure Laterality Date   CESAREAN SECTION MULTI-GESTATIONAL  11/18/2014   Procedure: CESAREAN SECTION MULTI-GESTATIONAL;  Surgeon: Granville Layer, MD;  Location: [facility];  Service: Obstetrics;;   LAPAROSCOPIC BILATERAL SALPINGECTOMY Bilateral 11/04/2018   Procedure: LAPAROSCOPIC BILATERAL SALPINGECTOMY;  Surgeon: Albino Hum, MD;  Location: [facility];  Service: Gynecology;  Laterality: Bilateral;   LAPAROSCOPIC LYSIS OF ADHESIONS N/A 11/04/2018   Procedure: LAPAROSCOPIC LYSIS OF ADHESIONS;  Surgeon: Albino Hum, MD;  Location: [facility];  Service: Gynecology;  Laterality: N/A;   MYOMECTOMY N/A 04/24/2017   Procedure: ABDOMINAL MYOMECTOMY OF UTERUS;  Surgeon: Wendelyn Halter, MD;  Location: [facility];  Service: Gynecology;  Laterality: N/A;   ORIF ORBITAL FRACTURE Left 05/11/2019   Procedure:  OPEN TREATMENT LEFT ORBITAL FLOOR FRACTURE WITH IMPLANT PERIORBITAL APPROACH;  Surgeon: Alger Infield, MD;  Location: [facility];  Service: Plastics;  Laterality: Left;   REMOVAL OF NON VAGINAL CONTRACEPTIVE DEVICE Right 04/24/2017   Procedure: REMOVAL OF NEXPLANON  RIGHT ARM;  Surgeon: Wendelyn Halter, MD;  Location: [facility];  Service: Gynecology;  Laterality: Right;    Family History  Problem Relation Age of Onset   Hypertension Father    Diabetes Maternal Grandmother     Social History:  reports that she has never smoked. She has never used smokeless tobacco. She reports current alcohol use of about 2.0 standard drinks of alcohol per week. She reports that she does not use drugs.  Allergies:  Allergies  Allergen Reactions   Peanuts [Peanut Oil] Itching    Itching in throat.    No medications prior to admission.    Review of Systems  Height 5' (1.524 m), weight 95.3 kg, last menstrual period 10/17/2023. Physical Exam BP 123/77   Pulse 83   Temp 98.2 F (36.8 C) (Oral)   Resp 16   Ht 5' (1.524 m)   Wt 95.3 kg   LMP 10/17/2023 (Exact Date)   SpO2 96%   BMI 41.01 kg/m   Physical exam:  A&O x 3, no acute distress. Pleasant HEENT neg, no thyromegaly Lungs CTA bilat CV RRR, SS2 normal Abdo soft, non tender, non acute Extr no edema/ tenderness Pelvic  uterus 16 wks, limited mobility  Results for orders placed or performed during the hospital encounter of  11/07/23 (from the past 24 hours)  Type and screen     Status: None (Preliminary result)   Collection Time: 11/07/23  1:25 PM  Result Value Ref Range   ABO/RH(D) PENDING    Antibody Screen PENDING    Sample Expiration 11/21/2023,2359    Extend sample reason      NO TRANSFUSIONS OR PREGNANCY IN THE PAST 3 MONTHS Performed at [facility], [address], [place], [place] [address]   Basic metabolic panel     Status: Abnormal   Collection Time: 11/07/23  1:30 PM  Result Value Ref Range    Sodium 136 135 - 145 mmol/L   Potassium 4.1 3.5 - 5.1 mmol/L   Chloride 105 98 - 111 mmol/L   CO2 26 22 - 32 mmol/L   Glucose, Bld 100 (H) 70 - 99 mg/dL   BUN 15 6 - 20 mg/dL   Creatinine, Ser 6.04 0.44 - 1.00 mg/dL   Calcium  9.2 8.9 - 10.3 mg/dL   GFR, Estimated >54 >09 mL/min   Anion gap 5 5 - 15    No results found.  Assessment/Plan: Symptomatic uterine leiomyomas, recurrent. Done having kids, wants definitive surgery..  Planning Abdominal hysterectomy. Planning to have anesthesia place TAP block.   reviewed images and exam with pt. Solitary subserosal myoma. Option to attempt L/scopic hysterectomy vs Open. can use Lupron to shrink myoma some and will have good success for L/scopic hyst. She declines Lupron and prefers open since has 2 prior surgeries from same cut- one C/s and then Myomectomy on 2018. She was counseled L/scopic recovery is better but says she has help and will manage  Will keep ovaries based on age and risk factors are none. Risks and complications of surgery reviewed, incl infection, bleeding, damage to bladder, bowel, ureters and blood vessels and need to involve other specialty if that should happen. Discussed post op stay, recovery and activity restrictions in 6 weeks until well healed o/w risk for poor healing, would separation, dehiscence and hernia in future. Need for blood transfusion in case of heavy bleeding or post op severe anemia. Risks of blood borne diseases reviewed.  Patient aware/ understands and agrees. All questions and concerns addressed to her satisfaction.  Shasta Deist MD  Corbin Dess Jewelene Morton

## 2023-11-08 ENCOUNTER — Other Ambulatory Visit: Payer: Self-pay

## 2023-11-08 ENCOUNTER — Observation Stay (HOSPITAL_COMMUNITY)
Admission: RE | Admit: 2023-11-08 | Discharge: 2023-11-09 | Disposition: A | Discharge status: Home / Self Care | Attending: Obstetrics & Gynecology | Admitting: Obstetrics & Gynecology

## 2023-11-08 ENCOUNTER — Encounter (HOSPITAL_COMMUNITY)
Admission: RE | Disposition: A | Discharge status: Home / Self Care | Payer: Self-pay | Source: Home / Self Care | Attending: Obstetrics & Gynecology

## 2023-11-08 ENCOUNTER — Encounter (HOSPITAL_COMMUNITY): Payer: Self-pay | Admitting: Obstetrics & Gynecology

## 2023-11-08 ENCOUNTER — Inpatient Hospital Stay (HOSPITAL_COMMUNITY): Admitting: Anesthesiology

## 2023-11-08 DIAGNOSIS — Z90710 Acquired absence of both cervix and uterus: Secondary | ICD-10-CM | POA: Diagnosis not present

## 2023-11-08 DIAGNOSIS — D259 Leiomyoma of uterus, unspecified: Secondary | ICD-10-CM | POA: Diagnosis present

## 2023-11-08 DIAGNOSIS — D252 Subserosal leiomyoma of uterus: Secondary | ICD-10-CM | POA: Insufficient documentation

## 2023-11-08 DIAGNOSIS — D251 Intramural leiomyoma of uterus: Secondary | ICD-10-CM | POA: Diagnosis not present

## 2023-11-08 DIAGNOSIS — Z01818 Encounter for other preprocedural examination: Principal | ICD-10-CM

## 2023-11-08 DIAGNOSIS — N72 Inflammatory disease of cervix uteri: Secondary | ICD-10-CM | POA: Diagnosis not present

## 2023-11-08 DIAGNOSIS — F1090 Alcohol use, unspecified, uncomplicated: Secondary | ICD-10-CM | POA: Insufficient documentation

## 2023-11-08 DIAGNOSIS — Z91010 Allergy to peanuts: Secondary | ICD-10-CM | POA: Diagnosis not present

## 2023-11-08 HISTORY — PX: HYSTERECTOMY ABDOMINAL WITH SALPINGECTOMY: SHX6725

## 2023-11-08 HISTORY — DX: Gastro-esophageal reflux disease without esophagitis: K21.9

## 2023-11-08 HISTORY — DX: Leiomyoma of uterus, unspecified: D25.9

## 2023-11-08 HISTORY — DX: Other seasonal allergic rhinitis: J30.2

## 2023-11-08 LAB — POCT PREGNANCY, URINE: Preg Test, Ur: NEGATIVE

## 2023-11-08 SURGERY — HYSTERECTOMY, TOTAL, ABDOMINAL, WITH SALPINGECTOMY
Anesthesia: Regional

## 2023-11-08 MED ORDER — ACETAMINOPHEN 500 MG PO TABS
1000.0000 mg | ORAL_TABLET | Freq: Four times a day (QID) | ORAL | Status: DC
  Administered 2023-11-08: 1000 mg via ORAL
  Filled 2023-11-08 (×2): qty 2

## 2023-11-08 MED ORDER — MIDAZOLAM HCL 2 MG/2ML IJ SOLN
INTRAMUSCULAR | Status: DC | PRN
  Administered 2023-11-08: 2 mg via INTRAVENOUS

## 2023-11-08 MED ORDER — HYDROMORPHONE HCL 1 MG/ML IJ SOLN
2.0000 mg | Freq: Four times a day (QID) | INTRAMUSCULAR | Status: DC | PRN
  Administered 2023-11-08: 2 mg via INTRAVENOUS
  Filled 2023-11-08 (×2): qty 2

## 2023-11-08 MED ORDER — LACTATED RINGERS IV SOLN: INTRAVENOUS | Status: AC

## 2023-11-08 MED ORDER — SCOPOLAMINE 1 MG/3DAYS TD PT72
1.0000 | MEDICATED_PATCH | TRANSDERMAL | Status: DC
  Administered 2023-11-08: 1.5 mg via TRANSDERMAL

## 2023-11-08 MED ORDER — HYDROMORPHONE HCL 1 MG/ML IJ SOLN
INTRAMUSCULAR | Status: AC
  Filled 2023-11-08: qty 1

## 2023-11-08 MED ORDER — SUGAMMADEX SODIUM 200 MG/2ML IV SOLN
INTRAVENOUS | Status: DC | PRN
  Administered 2023-11-08: 200 mg via INTRAVENOUS

## 2023-11-08 MED ORDER — IBUPROFEN 600 MG PO TABS: 600.0000 mg | ORAL_TABLET | Freq: Four times a day (QID) | ORAL | Status: DC

## 2023-11-08 MED ORDER — PHENYLEPHRINE 80 MCG/ML (10ML) SYRINGE FOR IV PUSH (FOR BLOOD PRESSURE SUPPORT)
PREFILLED_SYRINGE | INTRAVENOUS | Status: DC | PRN
  Administered 2023-11-08 (×2): 160 ug via INTRAVENOUS

## 2023-11-08 MED ORDER — DEXAMETHASONE SODIUM PHOSPHATE 10 MG/ML IJ SOLN
INTRAMUSCULAR | Status: DC | PRN
  Administered 2023-11-08: 10 mg via INTRAVENOUS

## 2023-11-08 MED ORDER — KETOROLAC TROMETHAMINE 30 MG/ML IJ SOLN
30.0000 mg | Freq: Four times a day (QID) | INTRAMUSCULAR | Status: DC
  Administered 2023-11-08 – 2023-11-09 (×3): 30 mg via INTRAVENOUS
  Filled 2023-11-08 (×3): qty 1

## 2023-11-08 MED ORDER — ORAL CARE MOUTH RINSE: 15.0000 mL | Freq: Once | OROMUCOSAL | Status: AC

## 2023-11-08 MED ORDER — BUPIVACAINE HCL (PF) 0.25 % IJ SOLN
INTRAMUSCULAR | Status: AC
  Filled 2023-11-08: qty 30

## 2023-11-08 MED ORDER — OXYCODONE HCL 5 MG PO TABS: 5.0000 mg | ORAL_TABLET | ORAL | Status: DC | PRN

## 2023-11-08 MED ORDER — SCOPOLAMINE 1 MG/3DAYS TD PT72
MEDICATED_PATCH | TRANSDERMAL | Status: AC
  Filled 2023-11-08: qty 1

## 2023-11-08 MED ORDER — DEXMEDETOMIDINE HCL IN NACL 80 MCG/20ML IV SOLN
INTRAVENOUS | Status: AC
  Filled 2023-11-08: qty 20

## 2023-11-08 MED ORDER — OXYCODONE HCL 5 MG/5ML PO SOLN: 5.0000 mg | Freq: Once | ORAL | Status: DC | PRN

## 2023-11-08 MED ORDER — KETOROLAC TROMETHAMINE 30 MG/ML IJ SOLN: 30.0000 mg | Freq: Once | INTRAMUSCULAR | Status: DC

## 2023-11-08 MED ORDER — CEFAZOLIN SODIUM-DEXTROSE 2-4 GM/100ML-% IV SOLN
INTRAVENOUS | Status: AC
  Filled 2023-11-08: qty 100

## 2023-11-08 MED ORDER — MENTHOL 3 MG MT LOZG: 1.0000 | LOZENGE | OROMUCOSAL | Status: DC | PRN

## 2023-11-08 MED ORDER — BUPIVACAINE HCL (PF) 0.25 % IJ SOLN
INTRAMUSCULAR | Status: DC | PRN
  Administered 2023-11-08: 60 mL

## 2023-11-08 MED ORDER — MIDAZOLAM HCL 2 MG/2ML IJ SOLN
INTRAMUSCULAR | Status: AC
  Filled 2023-11-08: qty 2

## 2023-11-08 MED ORDER — ONDANSETRON HCL 4 MG/2ML IJ SOLN
INTRAMUSCULAR | Status: AC
  Filled 2023-11-08: qty 2

## 2023-11-08 MED ORDER — ONDANSETRON HCL 4 MG/2ML IJ SOLN
INTRAMUSCULAR | Status: DC | PRN
  Administered 2023-11-08: 4 mg via INTRAVENOUS

## 2023-11-08 MED ORDER — PROPOFOL 10 MG/ML IV BOLUS
INTRAVENOUS | Status: DC | PRN
  Administered 2023-11-08: 180 mg via INTRAVENOUS

## 2023-11-08 MED ORDER — LACTATED RINGERS IV SOLN: INTRAVENOUS | Status: DC

## 2023-11-08 MED ORDER — DROPERIDOL 2.5 MG/ML IJ SOLN: 0.6250 mg | Freq: Once | INTRAMUSCULAR | Status: DC | PRN

## 2023-11-08 MED ORDER — PHENYLEPHRINE HCL-NACL 20-0.9 MG/250ML-% IV SOLN
INTRAVENOUS | Status: DC | PRN
  Administered 2023-11-08: 25 ug/min via INTRAVENOUS

## 2023-11-08 MED ORDER — ROCURONIUM BROMIDE 10 MG/ML (PF) SYRINGE
PREFILLED_SYRINGE | INTRAVENOUS | Status: AC
  Filled 2023-11-08: qty 10

## 2023-11-08 MED ORDER — 0.9 % SODIUM CHLORIDE (POUR BTL) OPTIME
TOPICAL | Status: DC | PRN
  Administered 2023-11-08: 3000 mL

## 2023-11-08 MED ORDER — LIDOCAINE 2% (20 MG/ML) 5 ML SYRINGE
INTRAMUSCULAR | Status: AC
Start: 2023-11-08 — End: 2023-11-08
  Filled 2023-11-08: qty 5

## 2023-11-08 MED ORDER — POVIDONE-IODINE 10 % EX SWAB: 2.0000 | Freq: Once | CUTANEOUS | Status: DC

## 2023-11-08 MED ORDER — FENTANYL CITRATE (PF) 250 MCG/5ML IJ SOLN
INTRAMUSCULAR | Status: DC | PRN
  Administered 2023-11-08: 50 ug via INTRAVENOUS
  Administered 2023-11-08: 100 ug via INTRAVENOUS
  Administered 2023-11-08 (×2): 50 ug via INTRAVENOUS

## 2023-11-08 MED ORDER — CELECOXIB 200 MG PO CAPS
200.0000 mg | ORAL_CAPSULE | Freq: Once | ORAL | Status: AC
  Administered 2023-11-08: 200 mg via ORAL

## 2023-11-08 MED ORDER — DEXAMETHASONE SODIUM PHOSPHATE 10 MG/ML IJ SOLN
INTRAMUSCULAR | Status: DC | PRN
  Administered 2023-11-08: 10 mg

## 2023-11-08 MED ORDER — ACETAMINOPHEN 500 MG PO TABS
1000.0000 mg | ORAL_TABLET | Freq: Once | ORAL | Status: AC
  Administered 2023-11-08: 1000 mg via ORAL

## 2023-11-08 MED ORDER — CEFAZOLIN SODIUM-DEXTROSE 2-4 GM/100ML-% IV SOLN
2.0000 g | INTRAVENOUS | Status: AC
  Administered 2023-11-08: 2 g via INTRAVENOUS

## 2023-11-08 MED ORDER — ROCURONIUM BROMIDE 10 MG/ML (PF) SYRINGE
PREFILLED_SYRINGE | INTRAVENOUS | Status: AC
Start: 2023-11-08 — End: 2023-11-08
  Filled 2023-11-08: qty 10

## 2023-11-08 MED ORDER — CELECOXIB 200 MG PO CAPS
ORAL_CAPSULE | ORAL | Status: AC
  Filled 2023-11-08: qty 1

## 2023-11-08 MED ORDER — PROPOFOL 10 MG/ML IV BOLUS
INTRAVENOUS | Status: AC
Start: 2023-11-08 — End: 2023-11-08
  Filled 2023-11-08: qty 20

## 2023-11-08 MED ORDER — HYDROMORPHONE HCL 1 MG/ML IJ SOLN
0.2500 mg | INTRAMUSCULAR | Status: DC | PRN
  Administered 2023-11-08: 0.5 mg via INTRAVENOUS

## 2023-11-08 MED ORDER — DEXMEDETOMIDINE HCL IN NACL 200 MCG/50ML IV SOLN
INTRAVENOUS | Status: DC | PRN
  Administered 2023-11-08: 4 ug via INTRAVENOUS

## 2023-11-08 MED ORDER — CHLORHEXIDINE GLUCONATE 0.12 % MT SOLN
OROMUCOSAL | Status: AC
  Administered 2023-11-08: 15 mL via OROMUCOSAL
  Filled 2023-11-08: qty 15

## 2023-11-08 MED ORDER — CHLORHEXIDINE GLUCONATE 0.12 % MT SOLN: 15.0000 mL | Freq: Once | OROMUCOSAL | Status: AC

## 2023-11-08 MED ORDER — OXYCODONE HCL 5 MG PO TABS: 5.0000 mg | ORAL_TABLET | Freq: Once | ORAL | Status: DC | PRN

## 2023-11-08 MED ORDER — ROCURONIUM BROMIDE 10 MG/ML (PF) SYRINGE
PREFILLED_SYRINGE | INTRAVENOUS | Status: DC | PRN
  Administered 2023-11-08: 60 mg via INTRAVENOUS
  Administered 2023-11-08: 10 mg via INTRAVENOUS
  Administered 2023-11-08 (×2): 20 mg via INTRAVENOUS
  Administered 2023-11-08: 5 mg via INTRAVENOUS

## 2023-11-08 MED ORDER — FENTANYL CITRATE (PF) 250 MCG/5ML IJ SOLN
INTRAMUSCULAR | Status: AC
  Filled 2023-11-08: qty 5

## 2023-11-08 MED ORDER — ACETAMINOPHEN 500 MG PO TABS
ORAL_TABLET | ORAL | Status: AC
  Filled 2023-11-08: qty 2

## 2023-11-08 MED ORDER — LIDOCAINE 2% (20 MG/ML) 5 ML SYRINGE
INTRAMUSCULAR | Status: DC | PRN
  Administered 2023-11-08: 60 mg via INTRAVENOUS

## 2023-11-08 SURGICAL SUPPLY — 37 items
BAG COUNTER SPONGE SURGICOUNT (BAG) ×1 IMPLANT
BENZOIN TINCTURE PRP APPL 2/3 (GAUZE/BANDAGES/DRESSINGS) IMPLANT
CANISTER SUCTION 3000ML PPV (SUCTIONS) ×1 IMPLANT
COVER MAYO STAND STRL (DRAPES) ×1 IMPLANT
DRAPE CESAREAN BIRTH W POUCH (DRAPES) ×1 IMPLANT
DRAPE WARM FLUID 44X44 (DRAPES) IMPLANT
DRSG OPSITE POSTOP 4X10 (GAUZE/BANDAGES/DRESSINGS) ×1 IMPLANT
DURAPREP 26ML APPLICATOR (WOUND CARE) ×1 IMPLANT
GAUZE 4X4 16PLY ~~LOC~~+RFID DBL (SPONGE) IMPLANT
GLOVE BIO SURGEON STRL SZ7 (GLOVE) ×1 IMPLANT
GLOVE SURG UNDER POLY LF SZ7 (GLOVE) ×3 IMPLANT
GOWN STRL REUS W/ TWL LRG LVL3 (GOWN DISPOSABLE) ×3 IMPLANT
HIBICLENS CHG 4% 4OZ BTL (MISCELLANEOUS) ×1 IMPLANT
KIT TURNOVER KIT B (KITS) ×1 IMPLANT
LIGASURE IMPACT 36 18CM CVD LR (INSTRUMENTS) IMPLANT
NDL HYPO 22X1.5 SAFETY MO (MISCELLANEOUS) ×1 IMPLANT
NEEDLE HYPO 22X1.5 SAFETY MO (MISCELLANEOUS) ×1 IMPLANT
NS IRRIG 1000ML POUR BTL (IV SOLUTION) ×1 IMPLANT
PACK ABDOMINAL GYN (CUSTOM PROCEDURE TRAY) ×1 IMPLANT
PAD ARMBOARD POSITIONER FOAM (MISCELLANEOUS) ×1 IMPLANT
PAD OB MATERNITY 11 LF (PERSONAL CARE ITEMS) ×1 IMPLANT
PENCIL SMOKE EVACUATOR (MISCELLANEOUS) ×1 IMPLANT
RTRCTR C-SECT PINK 25CM LRG (MISCELLANEOUS) IMPLANT
SPONGE T-LAP 18X18 ~~LOC~~+RFID (SPONGE) ×2 IMPLANT
STRIP CLOSURE SKIN 1/2X4 (GAUZE/BANDAGES/DRESSINGS) IMPLANT
SUT PLAIN 2 0 XLH (SUTURE) IMPLANT
SUT PLAIN ABS 2-0 CT1 27XMFL (SUTURE) IMPLANT
SUT PROLENE 0 CT 1 30 (SUTURE) IMPLANT
SUT VIC AB 0 CT1 18XCR BRD8 (SUTURE) ×3 IMPLANT
SUT VIC AB 0 CT1 36 (SUTURE) ×4 IMPLANT
SUT VIC AB 2-0 CT1 (SUTURE) IMPLANT
SUT VIC AB 4-0 KS 27 (SUTURE) IMPLANT
SUT VIC AB 4-0 SH 18 (SUTURE) IMPLANT
SUT VICRYL 0 TIES 12 18 (SUTURE) ×1 IMPLANT
SYR CONTROL 10ML LL (SYRINGE) ×1 IMPLANT
TOWEL GREEN STERILE FF (TOWEL DISPOSABLE) ×2 IMPLANT
TRAY FOLEY W/BAG SLVR 14FR (SET/KITS/TRAYS/PACK) ×1 IMPLANT

## 2023-11-08 NOTE — Transfer of Care (Signed)
 Immediate Anesthesia Transfer of Care Note  Patient: Jacqueline Underwood  Procedure(s) Performed: HYSTERECTOMY, TOTAL, ABDOMINAL,  Patient Location: PACU  Anesthesia Type:General  Level of Consciousness: drowsy  Airway & Oxygen Therapy: Patient Spontanous Breathing and Patient connected to face mask oxygen  Post-op Assessment: Report given to RN, Post -op Vital signs reviewed and stable, and Patient moving all extremities X 4  Post vital signs: Reviewed  Last Vitals:  Vitals Value Taken Time  BP 110/58 11/08/23 10:24  Temp    Pulse 82 11/08/23 10:25  Resp 23 11/08/23 10:25  SpO2 99 % 11/08/23 10:25  Vitals shown include unfiled device data.  Last Pain:  Vitals:   11/08/23 0608  TempSrc: Oral  PainSc: 0-No pain         Complications: No notable events documented.

## 2023-11-08 NOTE — Anesthesia Procedure Notes (Signed)
 Procedure Name: Intubation Date/Time: 11/08/2023 7:40 AM  Performed by: Alinda Apley, CRNAPre-anesthesia Checklist: Patient identified, Emergency Drugs available, Suction available and Patient being monitored Patient Re-evaluated:Patient Re-evaluated prior to induction Oxygen Delivery Method: Circle System Utilized Preoxygenation: Pre-oxygenation with 100% oxygen Induction Type: IV induction Ventilation: Mask ventilation without difficulty Laryngoscope Size: Mac and 3 Grade View: Grade II Tube type: Oral Tube size: 7.0 mm Number of attempts: 1 Airway Equipment and Method: Stylet and Oral airway Placement Confirmation: ETT inserted through vocal cords under direct vision, positive ETCO2 and breath sounds checked- equal and bilateral Secured at: 22 cm Tube secured with: Tape Dental Injury: Teeth and Oropharynx as per pre-operative assessment

## 2023-11-08 NOTE — Anesthesia Preprocedure Evaluation (Signed)
 Anesthesia Evaluation  Patient identified by MRN, date of birth, ID band Patient awake    Reviewed: Allergy & Precautions, NPO status , Patient's Chart, lab work & pertinent test results  Airway Mallampati: II  TM Distance: >3 FB Neck ROM: Full    Dental no notable dental hx.    Pulmonary neg pulmonary ROS   Pulmonary exam normal        Cardiovascular negative cardio ROS  Rhythm:Regular Rate:Normal     Neuro/Psych negative neurological ROS  negative psych ROS   GI/Hepatic Neg liver ROS,GERD  ,,  Endo/Other  negative endocrine ROS    Renal/GU negative Renal ROS  negative genitourinary   Musculoskeletal negative musculoskeletal ROS (+)    Abdominal Normal abdominal exam  (+)   Peds  Hematology Lab Results      Component                Value               Date                      WBC                      6.5                 11/07/2023                HGB                      12.0                11/07/2023                HCT                      36.7                11/07/2023                MCV                      97.1                11/07/2023                PLT                      300                 11/07/2023              Anesthesia Other Findings   Reproductive/Obstetrics                             Anesthesia Physical Anesthesia Plan  ASA: 2  Anesthesia Plan: General and Regional   Post-op Pain Management: Regional block*, Celebrex PO (pre-op)* and Tylenol  PO (pre-op)*   Induction: Intravenous  PONV Risk Score and Plan: 3 and Ondansetron , Dexamethasone , Midazolam  and Treatment may vary due to age or medical condition  Airway Management Planned: Mask and Oral ETT  Additional Equipment: None  Intra-op Plan:   Post-operative Plan: Extubation in OR  Informed Consent: I have reviewed the patients History and Physical, chart, labs and discussed the procedure including the  risks, benefits and alternatives for the proposed  anesthesia with the patient or authorized representative who has indicated his/her understanding and acceptance.     Dental advisory given  Plan Discussed with: CRNA  Anesthesia Plan Comments:        Anesthesia Quick Evaluation

## 2023-11-08 NOTE — Anesthesia Procedure Notes (Signed)
 Anesthesia Regional Block: TAP block   Pre-Anesthetic Checklist: , timeout performed,  Correct Patient, Correct Site, Correct Laterality,  Correct Procedure, Correct Position, site marked,  Risks and benefits discussed,  Surgical consent,  Pre-op evaluation,  At surgeon's request and post-op pain management  Laterality: Left  Prep: Dura Prep       Needles:  Injection technique: Single-shot  Needle Type: Echogenic Stimulator Needle     Needle Length: 10cm  Needle Gauge: 20     Additional Needles:   Procedures:,,,, ultrasound used (permanent image in chart),,    Narrative:  Start time: 11/08/2023 7:32 AM End time: 11/08/2023 7:35 AM Injection made incrementally with aspirations every 5 mL.  Performed by: Personally  Anesthesiologist: Micheal Agent, DO  Additional Notes: Patient identified. Risks/Benefits/Options discussed with patient including but not limited to bleeding, infection, nerve damage, failed block, incomplete pain control. Patient expressed understanding and wished to proceed. All questions were answered. Sterile technique was used throughout the entire procedure. Please see nursing notes for vital signs. Aspirated in 5cc intervals with injection for negative confirmation. Patient was given instructions on fall risk and not to get out of bed. All questions and concerns addressed with instructions to call with any issues or inadequate analgesia.

## 2023-11-08 NOTE — Op Note (Signed)
 11/08/2023  Jacqueline Underwood  1991-05-16  PRE-OPERATIVE DIAGNOSIS:  Uterine Fibroids  [id number]  POST-OPERATIVE DIAGNOSIS:  Uterine Fibroids   PROCEDURE:  TOTAL ABDOMINAL HYSTERECTOMY,  SURGEON: Shasta Deist, MD  ASSISTANT:  Lori Rondo RNFA   ANESTHESIA:  General endotracheal  EBL: 100 cc   IVF: 1000 LR   Urine output: urine in foley 100 cc clear   BLOOD ADMINISTERED: none  LOCAL MEDICATIONS USED:  in TAP block (anesthesia record) \  Specimen: Uterus with fibroid and cervix   COUNTS:  YES x2  PATIENT DISPOSITION:  PACU - hemodynamically stable. Then admit for post-op care.   Delay start of Pharmacological VTE agent (>24hrs) due to surgical blood loss or risk of bleeding: yes  PROCEDURE:   Indication:  Symptomatic uterine fibroids with pain and menorrhagia. Risks and complications of surgery including infection, bleeding, damage to internal organs and other including but not limited to surgery related problems including pneumonia, VTE reviewed. Informed written consent was obtained.   Patient was brought to the operating room with IV running. She received 2 gm Ancef . She underwent general anesthesia without difficulty and was given dorsal supine position She had TAP block by anesthesiologist. She was prepped and draped in sterile fashion. Foley catheter was placed. Pfannenstiel incision was made with scalpel and carried down to the underlying fascia. Fascia ws densely adherent to rectus muscles. Fascia incised and extended laterally. Fascia grasped with Kocher's and underlying rectus muscles were dissected down. Rectus muscles were separated in midline after extensive careful dissection and hemostasis controlled well. Small separation in midline noted to show peritoneum otherwise muscles were adhesed in mid line. Peritoneum was grasped with mosquitoes and peritoneal entry made. Barnie Libra noted right under but no abdominal wall adhesions noted on palpation. Peritoneal incision extended  with scissors.  Large single fundal fibroid palpated. Bladder adhesions noted to lower uterine segment. Upper abdomen felt normal. Alexis retractor placed and bowels packed with 3 moist laps.  Uterus was delivered out of the incision with by grasping the fibroid with a tenaculum Fallopian tubes were absent. Ovaries were adhesed to uterus and endometriosis  noted on right side with ovary and right ligament adhesions  otherwise both ovaries appeared normal. Bilateral round ligaments were grasped with Loetta Ringer and cauterized and cut using LigaSure. Hemostasis noted.  Anterior broad ligament was dissected and cut to creat bladder flap. Bladder was pushed further away with blunt dissection with sponge stick. A window was created in posterior broad ligament and right utero-ovarian ligament was clamped and cauterized and cut with LigaSure. Hemostasis excellent. Left utero-ovarian pedicle also clamped cauterized and cut with LigaSure with good hemostasis. Posterior broad ligament was dissected bilaterally and uterine vessels were skeletonized. Bladder dissection advanced to cervical vaginal junction. Bilateral uterine vessels were clamped cauterized and cut with LigaSure. Bilateral parametrial tissue and cardinal ligaments cauterized and cut with LigaSure. Malleable retractor placed to protect bowel and uterus was amputated above the cervix and passed off and cervical stump was grasped with two Kocher's.  Hemostasis achieved with cautery. Straight Heaney clamps applied bilaterally, Cardinal ligaments cut and transfixed with 0 Vicryl. Bilateral curved Heaney's applied to vaginal angles with Uterosacral ligament and cut and transfixed. Vaginal opening noted, was cut circumferentially. Vaginal angles suture transfixed. Interrupted sutures with figure of 8 placed to complete cuff closure.Vaginal cuff, all pedicles and ovaries looked hemostatic. Ureters noted bilaterally with good peristalsis. Lap packs and Alexis retractor  removed.  Peritoneal edges grasped and peritoneum closed with 2-0 Vicryl. Fascia sutured  with 0 Vicryl from two ends and met in midline. Subcutaneous layer was deep and closed with 2-0 Plain gut. Skin approximated with 4-0 Vicryl in subcuticular fashion.  Steristrips and Honeycomb dressing placed.  All  Instruments/ lap/ sponges counts were correct x2. No complications.  Dr Ricky Charter was the surgeon for entire case.   --Terri Fester MD

## 2023-11-08 NOTE — Anesthesia Procedure Notes (Signed)
 Anesthesia Regional Block: TAP block   Pre-Anesthetic Checklist: , timeout performed,  Correct Patient, Correct Site, Correct Laterality,  Correct Procedure, Correct Position, site marked,  Risks and benefits discussed,  Surgical consent,  Pre-op evaluation,  At surgeon's request and post-op pain management  Laterality: Right  Prep: Dura Prep       Needles:  Injection technique: Single-shot  Needle Type: Echogenic Stimulator Needle     Needle Length: 10cm  Needle Gauge: 20     Additional Needles:   Procedures:,,,, ultrasound used (permanent image in chart),,    Narrative:  Start time: 11/08/2023 7:38 AM End time: 11/08/2023 7:41 AM Injection made incrementally with aspirations every 5 mL.  Performed by: Personally  Anesthesiologist: Micheal Agent, DO  Additional Notes: Patient identified. Risks/Benefits/Options discussed with patient including but not limited to bleeding, infection, nerve damage, failed block, incomplete pain control. Patient expressed understanding and wished to proceed. All questions were answered. Sterile technique was used throughout the entire procedure. Please see nursing notes for vital signs. Aspirated in 5cc intervals with injection for negative confirmation. Patient was given instructions on fall risk and not to get out of bed. All questions and concerns addressed with instructions to call with any issues or inadequate analgesia.

## 2023-11-09 DIAGNOSIS — D251 Intramural leiomyoma of uterus: Secondary | ICD-10-CM | POA: Diagnosis not present

## 2023-11-09 LAB — CBC
HCT: 32.8 % — ABNORMAL LOW (ref 36.0–46.0)
Hemoglobin: 10.9 g/dL — ABNORMAL LOW (ref 12.0–15.0)
MCH: 31.8 pg (ref 26.0–34.0)
MCHC: 33.2 g/dL (ref 30.0–36.0)
MCV: 95.6 fL (ref 80.0–100.0)
Platelets: 234 10*3/uL (ref 150–400)
RBC: 3.43 MIL/uL — ABNORMAL LOW (ref 3.87–5.11)
RDW: 11.7 % (ref 11.5–15.5)
WBC: 9.6 10*3/uL (ref 4.0–10.5)
nRBC: 0 % (ref 0.0–0.2)

## 2023-11-09 MED ORDER — DOCUSATE SODIUM 100 MG PO CAPS: 100.0000 mg | ORAL_CAPSULE | Freq: Two times a day (BID) | ORAL | Status: AC

## 2023-11-09 MED ORDER — ACETAMINOPHEN 500 MG PO TABS: 1000.0000 mg | ORAL_TABLET | Freq: Four times a day (QID) | ORAL | 0 refills | Status: AC

## 2023-11-09 MED ORDER — KETOROLAC TROMETHAMINE 30 MG/ML IJ SOLN: 30.0000 mg | Freq: Four times a day (QID) | INTRAMUSCULAR | 0 refills | Status: AC

## 2023-11-09 MED ORDER — OXYCODONE HCL 5 MG PO TABS: 5.0000 mg | ORAL_TABLET | Freq: Four times a day (QID) | ORAL | 0 refills | Status: AC | PRN

## 2023-11-09 MED ORDER — IBUPROFEN 600 MG PO TABS: 600.0000 mg | ORAL_TABLET | Freq: Four times a day (QID) | ORAL | 0 refills | Status: AC

## 2023-11-09 NOTE — Plan of Care (Signed)
  Problem: Education: Goal: Knowledge of General Education information will improve Description: Including pain rating scale, medication(s)/side effects and non-pharmacologic comfort measures Outcome: Completed/Met   Problem: Health Behavior/Discharge Planning: Goal: Ability to manage health-related needs will improve Outcome: Completed/Met   Problem: Clinical Measurements: Goal: Ability to maintain clinical measurements within normal limits will improve Outcome: Completed/Met Goal: Will remain free from infection Outcome: Completed/Met Goal: Diagnostic test results will improve Outcome: Completed/Met Goal: Respiratory complications will improve Outcome: Completed/Met Goal: Cardiovascular complication will be avoided Outcome: Completed/Met   Problem: Activity: Goal: Risk for activity intolerance will decrease Outcome: Completed/Met   Problem: Nutrition: Goal: Adequate nutrition will be maintained Outcome: Completed/Met   Problem: Coping: Goal: Level of anxiety will decrease Outcome: Completed/Met   Problem: Elimination: Goal: Will not experience complications related to bowel motility Outcome: Completed/Met Goal: Will not experience complications related to urinary retention Outcome: Completed/Met   Problem: Pain Managment: Goal: General experience of comfort will improve and/or be controlled Outcome: Completed/Met   Problem: Safety: Goal: Ability to remain free from injury will improve Outcome: Completed/Met   Problem: Skin Integrity: Goal: Risk for impaired skin integrity will decrease Outcome: Completed/Met   Problem: Education: Goal: Knowledge of the prescribed therapeutic regimen will improve Outcome: Completed/Met Goal: Understanding of sexual limitations or changes related to disease process or condition will improve Outcome: Completed/Met Goal: Individualized Educational Video(s) Outcome: Completed/Met   Problem: Self-Concept: Goal: Communication of  feelings regarding changes in body function or appearance will improve Outcome: Completed/Met   Problem: Skin Integrity: Goal: Demonstration of wound healing without infection will improve Outcome: Completed/Met

## 2023-11-09 NOTE — Progress Notes (Signed)
 1 Day Post-Op Procedure(s) (LRB): HYSTERECTOMY, TOTAL, ABDOMINAL, (N/A)  Subjective: Patient reports feels well, no n/v. Ate reg diet last night. Pain controlled, now PO and IV toradol   no vag bleeding .    Objective: I have reviewed patient's vital signs, intake and output, medications, and labs. BP (!) 109/45 (BP Location: Right Arm)   Pulse 83   Temp 98.7 F (37.1 C) (Oral)   Resp 16   Ht 5' (1.524 m)   Wt 95.3 kg   LMP 10/17/2023 (Exact Date)   SpO2 98%   BMI 41.01 kg/m   General: alert, cooperative, and appears stated age Resp: clear to auscultation bilaterally Cardio: regular rate and rhythm, S1, S2 normal, no murmur, click, rub or gallop GI: soft, non-tender; bowel sounds normal; no masses,  no organomegaly and incision: dry and bloody drainage present Extremities: extremities normal, atraumatic, no cyanosis or edema and Homans sign is negative, no sign of DVT Vaginal Bleeding: none  Incision has slight drainage left end      Latest Ref Rng & Units 11/09/2023    6:41 AM 11/07/2023    1:30 PM 03/11/2021    2:50 AM  CBC  WBC 4.0 - 10.5 K/uL 9.6  6.5  7.0   Hemoglobin 12.0 - 15.0 g/dL 40.9  81.1  91.4   Hematocrit 36.0 - 46.0 % 32.8  36.7  35.7   Platelets 150 - 400 K/uL 234  300  266      Assessment: s/p Procedure(s): HYSTERECTOMY, TOTAL, ABDOMINAL, (N/A): stable, progressing well, tolerating diet, and pain is controlled well.   Plan: Discharge home Post op care and call parameters dw pt  See me in office in 2 wks   LOS: 1 day    Shasta Deist, MD 11/09/2023, 8:02 AM

## 2023-11-09 NOTE — Discharge Summary (Signed)
 Physician Discharge Summary  Patient ID: Jacqueline Underwood MRN: [id number] DOB/AGE: Nov 25, 1990 33 y.o.  Admit date: 11/08/2023 Discharge date: 11/09/2023  Admission Diagnoses:  Discharge Diagnoses:  Principal Problem:   Uterine fibroid Active Problems:   S/P TAH (total abdominal hysterectomy)   Discharged Condition: {condition:18240}  Hospital Course: ***  Consults: {consultation:18241}  Significant Diagnostic Studies: {diagnostics:18242}  Treatments: {Tx:18249}  Discharge Exam: Blood pressure (!) 109/45, pulse 83, temperature 98.7 F (37.1 C), temperature source Oral, resp. rate 16, height 5' (1.524 m), weight 95.3 kg, last menstrual period 10/17/2023, SpO2 98%. {physical XLKG:4010272}  Disposition: Discharge disposition: 01-Home or Self Care       Discharge Instructions     Call MD for:   Complete by: As directed    Vaginal bleeding more than spots   Call MD for:  difficulty breathing, headache or visual disturbances   Complete by: As directed    Call MD for:  extreme fatigue   Complete by: As directed    Call MD for:  hives   Complete by: As directed    Call MD for:  persistant dizziness or light-headedness   Complete by: As directed    Call MD for:  persistant nausea and vomiting   Complete by: As directed    Call MD for:  redness, tenderness, or signs of infection (pain, swelling, redness, odor or green/yellow discharge around incision site)   Complete by: As directed    Call MD for:  severe uncontrolled pain   Complete by: As directed    Call MD for:  temperature >100.4   Complete by: As directed    Change dressing (specify)   Complete by: As directed    Remove honeycomb dressing on Wednesday 11/13/23   Diet - low sodium heart healthy   Complete by: As directed    Driving Restrictions   Complete by: As directed    2 weeks   Increase activity slowly   Complete by: As directed    Lifting restrictions   Complete by: As directed    No more than 20  pounds for 6 weeks   Sexual Activity Restrictions   Complete by: As directed    Nothing in vagina for 6 weeks, until released by doctor      Allergies as of 11/09/2023       Reactions   Peanuts [peanut Oil] Itching   Itching in throat.        Medication List     TAKE these medications    acetaminophen  500 MG tablet Commonly known as: TYLENOL  Take 2 tablets (1,000 mg total) by mouth every 6 (six) hours.   docusate sodium  100 MG capsule Commonly known as: Colace Take 1 capsule (100 mg total) by mouth 2 (two) times daily for 7 days.   ibuprofen  600 MG tablet Commonly known as: ADVIL  Take 1 tablet (600 mg total) by mouth every 6 (six) hours.   ketorolac  30 MG/ML injection Commonly known as: TORADOL  Inject 1 mL (30 mg total) into the vein every 6 (six) hours.   oxyCODONE  5 MG immediate release tablet Commonly known as: Oxy IR/ROXICODONE  Take 1 tablet (5 mg total) by mouth every 6 (six) hours as needed for moderate pain (pain score 4-6).               Discharge Care Instructions  (From admission, onward)           Start     Ordered   11/09/23 0000  Change dressing (  specify)       Comments: Remove honeycomb dressing on Wednesday 11/13/23   11/09/23 0809            Follow-up Information     Terri Fester, MD Follow up in 2 week(s).   Specialty: Obstetrics and Gynecology Contact information: [address] [place] [address] [phone]                 Signed: Shasta Deist 11/09/2023, 8:09 AM

## 2023-11-11 ENCOUNTER — Encounter (HOSPITAL_COMMUNITY): Payer: Self-pay | Admitting: Obstetrics & Gynecology

## 2023-11-11 LAB — SURGICAL PATHOLOGY

## 2023-11-11 NOTE — Anesthesia Postprocedure Evaluation (Signed)
 Anesthesia Post Note  Patient: [name]  Procedure(s) Performed: HYSTERECTOMY, TOTAL, ABDOMINAL,     Patient location during evaluation: PACU Anesthesia Type: Regional and General Level of consciousness: awake and alert Pain management: pain level controlled Vital Signs Assessment: post-procedure vital signs reviewed and stable Respiratory status: spontaneous breathing, nonlabored ventilation, respiratory function stable and patient connected to nasal cannula oxygen Cardiovascular status: blood pressure returned to baseline and stable Postop Assessment: no apparent nausea or vomiting Anesthetic complications: no   No notable events documented.  Last Vitals:  Vitals:   11/09/23 0828 11/09/23 0849  BP:  (!) 112/47  Pulse: 73 (!) 59  Resp: 16   Temp: 36.8 C   SpO2: 98%     Last Pain:  Vitals:   11/09/23 0922  TempSrc:   PainSc: 0-No pain                 Jacqueline Underwood

## 2024-05-23 ENCOUNTER — Encounter: Payer: Self-pay | Admitting: Emergency Medicine

## 2024-05-23 ENCOUNTER — Ambulatory Visit
Admission: EM | Admit: 2024-05-23 | Discharge: 2024-05-23 | Disposition: A | Discharge status: Home / Self Care | Attending: Family Medicine | Admitting: Family Medicine

## 2024-05-23 ENCOUNTER — Other Ambulatory Visit: Payer: Self-pay

## 2024-05-23 DIAGNOSIS — J069 Acute upper respiratory infection, unspecified: Secondary | ICD-10-CM

## 2024-05-23 LAB — POCT INFLUENZA A/B
Influenza A, POC: NEGATIVE
Influenza B, POC: NEGATIVE

## 2024-05-23 MED ORDER — AZELASTINE HCL 0.1 % NA SOLN: 1.0000 | Freq: Two times a day (BID) | NASAL | 0 refills | Status: AC

## 2024-05-23 MED ORDER — PROMETHAZINE-DM 6.25-15 MG/5ML PO SYRP: 5.0000 mL | ORAL_SOLUTION | Freq: Four times a day (QID) | ORAL | 0 refills | Status: AC | PRN

## 2024-05-23 NOTE — ED Triage Notes (Signed)
 Pt reports cough, body aches, nasal congestion x3 days. Reports intermittent chills but denies fevers. Has tried otc medication with no change in symptoms.

## 2024-05-23 NOTE — Discharge Instructions (Signed)
 In addition to the prescribed medications, you may take over-the-counter cold congestion medications, saline sinus rinses, humidifiers, Tylenol .  Follow-up for worsening or unresolving symptoms.

## 2024-05-23 NOTE — ED Provider Notes (Signed)
 " [facility]    CSN: [id number] Arrival date & time: 05/23/24  9176      History   Chief Complaint Chief Complaint  Patient presents with   Cough    HPI Halei ELYANA GRABSKI is a 33 y.o. female.   Patient presenting today with 3-day history of cough, body aches, congestion, chills, fatigue.  Denies chest pain, shortness of breath, abdominal pain, vomiting, diarrhea.  So far trying over-the-counter cold and congestion medications with minimal relief.  Multiple sick contacts recently.  No known history of cardiopulmonary disease.    Past Medical History:  Diagnosis Date   GERD (gastroesophageal reflux disease)    watches diet   Menorrhagia    Seasonal allergic rhinitis    Uterine fibroid     Patient Active Problem List   Diagnosis Date Noted   Uterine fibroid 11/08/2023   S/P TAH (total abdominal hysterectomy) 11/08/2023   Laceration of right lower extremity 12/01/2021   Refused influenza vaccine 07/25/2021   Gastroesophageal reflux disease 07/25/2021   Premenstrual syndrome 07/25/2021   Annual visit for general adult medical examination with abnormal findings 01/01/2020   Obesity with body mass index (BMI) of 35.0 to 39.9 without comorbidity 07/21/2019    Past Surgical History:  Procedure Laterality Date   CESAREAN SECTION MULTI-GESTATIONAL  11/18/2014   Procedure: CESAREAN SECTION MULTI-GESTATIONAL;  Surgeon: Glenys GORMAN Birk, MD;  Location: [facility];  Service: Obstetrics;;   HYSTERECTOMY ABDOMINAL WITH SALPINGECTOMY N/A 11/08/2023   Procedure: HYSTERECTOMY, TOTAL, ABDOMINAL,;  Surgeon: Barbette Knock, MD;  [facility];  Service: Gynecology;  Laterality: N/A;   LAPAROSCOPIC BILATERAL SALPINGECTOMY Bilateral 11/04/2018   Procedure: LAPAROSCOPIC BILATERAL SALPINGECTOMY;  Surgeon: Edsel Norleen GAILS, MD;  Location: [facility];  Service: Gynecology;  Laterality: Bilateral;   LAPAROSCOPIC LYSIS OF ADHESIONS N/A 11/04/2018   Procedure: LAPAROSCOPIC LYSIS OF ADHESIONS;   Surgeon: Edsel Norleen GAILS, MD;  Location: [facility];  Service: Gynecology;  Laterality: N/A;   MYOMECTOMY N/A 04/24/2017   Procedure: ABDOMINAL MYOMECTOMY OF UTERUS;  Surgeon: Jayne Vonn DEL, MD;  Location: [facility];  Service: Gynecology;  Laterality: N/A;   ORIF ORBITAL FRACTURE Left 05/11/2019   Procedure: OPEN TREATMENT LEFT ORBITAL FLOOR FRACTURE WITH IMPLANT PERIORBITAL APPROACH;  Surgeon: Arelia Filippo, MD;  Location: [facility];  Service: Plastics;  Laterality: Left;   REMOVAL OF NON VAGINAL CONTRACEPTIVE DEVICE Right 04/24/2017   Procedure: REMOVAL OF NEXPLANON  RIGHT ARM;  Surgeon: Jayne Vonn DEL, MD;  Location: [facility];  Service: Gynecology;  Laterality: Right;    OB History     Gravida  3   Para  2   Term  1   Preterm  1   AB  1   Living  3      SAB  1   IAB      Ectopic      Multiple  1   Live Births  3            Home Medications    Prior to Admission medications  Medication Sig Start Date End Date Taking? Authorizing Provider  azelastine  (ASTELIN ) 0.1 % nasal spray Place 1 spray into both nostrils 2 (two) times daily. Use in each nostril as directed 05/23/24  Yes Stuart Vernell Norris, PA-C  promethazine -dextromethorphan (PROMETHAZINE -DM) 6.25-15 MG/5ML syrup Take 5 mLs by mouth 4 (four) times daily as needed. 05/23/24  Yes Stuart Vernell Norris, PA-C  acetaminophen  (TYLENOL ) 500 MG tablet Take 2 tablets (1,000 mg total) by  mouth every 6 (six) hours. 11/09/23   Barbette Knock, MD  ibuprofen  (ADVIL ) 600 MG tablet Take 1 tablet (600 mg total) by mouth every 6 (six) hours. 11/09/23   Barbette Knock, MD  ketorolac  (TORADOL ) 30 MG/ML injection Inject 1 mL (30 mg total) into the vein every 6 (six) hours. 11/09/23   Barbette Knock, MD  oxyCODONE  (OXY IR/ROXICODONE ) 5 MG immediate release tablet Take 1 tablet (5 mg total) by mouth every 6 (six) hours as needed for moderate pain (pain score 4-6). 11/09/23   Barbette Knock, MD    Family  History Family History  Problem Relation Age of Onset   Hypertension Father    Diabetes Maternal Grandmother     Social History Social History[1]   Allergies   Peanuts [peanut oil]   Review of Systems Review of Systems PER HPI  Physical Exam Triage Vital Signs ED Triage Vitals  Encounter Vitals Group     BP 05/23/24 1047 94/62     Girls Systolic BP Percentile --      Girls Diastolic BP Percentile --      Boys Systolic BP Percentile --      Boys Diastolic BP Percentile --      Pulse Rate 05/23/24 1047 75     Resp 05/23/24 1047 20     Temp 05/23/24 1047 98.2 F (36.8 C)     Temp Source 05/23/24 1047 Oral     SpO2 05/23/24 1047 99 %     Weight --      Height --      Head Circumference --      Peak Flow --      Pain Score 05/23/24 1045 6     Pain Loc --      Pain Education --      Exclude from Growth Chart --    No data found.  Updated Vital Signs BP 94/62 (BP Location: Right Arm)   Pulse 75   Temp 98.2 F (36.8 C) (Oral)   Resp 20   SpO2 99%   Visual Acuity Right Eye Distance:   Left Eye Distance:   Bilateral Distance:    Right Eye Near:   Left Eye Near:    Bilateral Near:     Physical Exam Vitals and nursing note reviewed.  Constitutional:      Appearance: Normal appearance.  HENT:     Head: Atraumatic.     Right Ear: Tympanic membrane and external ear normal.     Left Ear: Tympanic membrane and external ear normal.     Nose: Rhinorrhea present.     Mouth/Throat:     Mouth: Mucous membranes are moist.     Pharynx: Posterior oropharyngeal erythema present.  Eyes:     Extraocular Movements: Extraocular movements intact.     Conjunctiva/sclera: Conjunctivae normal.  Cardiovascular:     Rate and Rhythm: Normal rate and regular rhythm.     Heart sounds: Normal heart sounds.  Pulmonary:     Effort: Pulmonary effort is normal.     Breath sounds: Normal breath sounds. No wheezing or rales.  Musculoskeletal:        General: Normal range of  motion.     Cervical back: Normal range of motion and neck supple.  Skin:    General: Skin is warm and dry.  Neurological:     Mental Status: She is alert and oriented to person, place, and time.  Psychiatric:        Mood and  Affect: Mood normal.        Thought Content: Thought content normal.      UC Treatments / Results  Labs (all labs ordered are listed, but only abnormal results are displayed) Labs Reviewed  POCT INFLUENZA A/B    EKG   Radiology No results found.  Procedures Procedures (including critical care time)  Medications Ordered in UC Medications - No data to display  Initial Impression / Assessment and Plan / UC Course  I have reviewed the triage vital signs and the nursing notes.  Pertinent labs & imaging results that were available during my care of the patient were reviewed by me and considered in my medical decision making (see chart for details).     Rapid Flu negative, vitals and exam reassuring today.  Suspect viral respiratory infection.  Treat with Astelin , Phenergan  DM, supportive over-the-counter medications and home care.  Return for worsening or unresolving symptoms.  Final Clinical Impressions(s) / UC Diagnoses   Final diagnoses:  Viral URI with cough     Discharge Instructions      In addition to the prescribed medications, you may take over-the-counter cold congestion medications, saline sinus rinses, humidifiers, Tylenol .  Follow-up for worsening or unresolving symptoms.    ED Prescriptions     Medication Sig Dispense Auth. Provider   azelastine  (ASTELIN ) 0.1 % nasal spray Place 1 spray into both nostrils 2 (two) times daily. Use in each nostril as directed 30 mL Stuart Vernell Norris, PA-C   promethazine -dextromethorphan (PROMETHAZINE -DM) 6.25-15 MG/5ML syrup Take 5 mLs by mouth 4 (four) times daily as needed. 100 mL Stuart Vernell Norris, [place]      PDMP not reviewed this encounter.    [1]  Social History Tobacco Use    Smoking status: Never   Smokeless tobacco: Never  Vaping Use   Vaping status: Never Used  Substance Use Topics   Alcohol use: Yes    Alcohol/week: 2.0 standard drinks of alcohol    Types: 2 Standard drinks or equivalent per week   Drug use: Never     Stuart Vernell Norris, PA-C 05/23/24 1111  "

## 2024-05-24 ENCOUNTER — Ambulatory Visit: Payer: Self-pay
